# Patient Record
Sex: Female | Born: 1935
Health system: Southern US, Community
[De-identification: ages and names within clinical notes are randomized; demographics above are authoritative.]

## PROBLEM LIST (undated history)

## (undated) DIAGNOSIS — I340 Nonrheumatic mitral (valve) insufficiency: Secondary | ICD-10-CM

## (undated) DIAGNOSIS — I10 Essential (primary) hypertension: Secondary | ICD-10-CM

## (undated) DIAGNOSIS — E785 Hyperlipidemia, unspecified: Secondary | ICD-10-CM

## (undated) DIAGNOSIS — E119 Type 2 diabetes mellitus without complications: Secondary | ICD-10-CM

## (undated) DIAGNOSIS — M1990 Unspecified osteoarthritis, unspecified site: Secondary | ICD-10-CM

## (undated) DIAGNOSIS — IMO0001 Reserved for inherently not codable concepts without codable children: Secondary | ICD-10-CM

## (undated) DIAGNOSIS — J45909 Unspecified asthma, uncomplicated: Secondary | ICD-10-CM

## (undated) DIAGNOSIS — K219 Gastro-esophageal reflux disease without esophagitis: Secondary | ICD-10-CM

## (undated) DIAGNOSIS — N390 Urinary tract infection, site not specified: Secondary | ICD-10-CM

## (undated) DIAGNOSIS — E559 Vitamin D deficiency, unspecified: Secondary | ICD-10-CM

## (undated) HISTORY — DX: Vitamin D deficiency, unspecified: E55.9

## (undated) HISTORY — DX: Essential (primary) hypertension: I10

## (undated) HISTORY — DX: Hyperlipidemia, unspecified: E78.5

## (undated) HISTORY — PX: BACK SURGERY: SHX140

## (undated) HISTORY — DX: Nonrheumatic mitral (valve) insufficiency: I34.0

## (undated) HISTORY — DX: Unspecified asthma, uncomplicated: J45.909

## (undated) HISTORY — PX: OTHER SURGICAL HISTORY: SHX169

## (undated) HISTORY — PX: EXPLORATION MIDDLE EAR: SUR585

## (undated) HISTORY — PX: BLADDER REPAIR: SHX76

## (undated) HISTORY — PX: KNEE ARTHROSCOPY: SUR90

---

## 1978-08-11 HISTORY — PX: CHOLECYSTECTOMY: SHX55

## 2000-05-21 ENCOUNTER — Encounter: Payer: Self-pay | Admitting: Orthopedic Surgery

## 2000-05-21 ENCOUNTER — Encounter: Admission: RE | Admit: 2000-05-21 | Discharge: 2000-05-21 | Payer: Self-pay | Admitting: Orthopedic Surgery

## 2000-07-09 ENCOUNTER — Ambulatory Visit (HOSPITAL_COMMUNITY): Admission: RE | Admit: 2000-07-09 | Discharge: 2000-07-09 | Payer: Self-pay | Admitting: Gastroenterology

## 2000-09-16 ENCOUNTER — Ambulatory Visit (HOSPITAL_COMMUNITY): Admission: RE | Admit: 2000-09-16 | Discharge: 2000-09-16 | Payer: Self-pay | Admitting: Orthopedic Surgery

## 2000-09-22 ENCOUNTER — Encounter: Admission: RE | Admit: 2000-09-22 | Discharge: 2000-12-21 | Payer: Self-pay | Admitting: Orthopedic Surgery

## 2000-10-30 ENCOUNTER — Ambulatory Visit (HOSPITAL_COMMUNITY): Admission: RE | Admit: 2000-10-30 | Discharge: 2000-10-30 | Payer: Self-pay | Admitting: Neurosurgery

## 2000-10-30 ENCOUNTER — Encounter: Payer: Self-pay | Admitting: Neurosurgery

## 2001-03-03 ENCOUNTER — Ambulatory Visit (HOSPITAL_COMMUNITY): Admission: RE | Admit: 2001-03-03 | Discharge: 2001-03-03 | Payer: Self-pay | Admitting: Neurosurgery

## 2002-05-05 ENCOUNTER — Encounter: Payer: Self-pay | Admitting: Urology

## 2002-05-05 ENCOUNTER — Encounter: Admission: RE | Admit: 2002-05-05 | Discharge: 2002-05-05 | Payer: Self-pay | Admitting: Urology

## 2002-08-08 ENCOUNTER — Encounter: Payer: Self-pay | Admitting: Neurosurgery

## 2002-08-08 ENCOUNTER — Ambulatory Visit (HOSPITAL_COMMUNITY): Admission: RE | Admit: 2002-08-08 | Discharge: 2002-08-08 | Payer: Self-pay | Admitting: Neurosurgery

## 2002-08-26 ENCOUNTER — Encounter: Payer: Self-pay | Admitting: Neurosurgery

## 2002-08-31 ENCOUNTER — Encounter: Payer: Self-pay | Admitting: Neurosurgery

## 2002-08-31 ENCOUNTER — Inpatient Hospital Stay (HOSPITAL_COMMUNITY): Admission: RE | Admit: 2002-08-31 | Discharge: 2002-09-02 | Payer: Self-pay | Admitting: Neurosurgery

## 2002-10-19 ENCOUNTER — Encounter: Payer: Self-pay | Admitting: Neurosurgery

## 2002-10-19 ENCOUNTER — Encounter: Admission: RE | Admit: 2002-10-19 | Discharge: 2002-10-19 | Payer: Self-pay | Admitting: Neurosurgery

## 2002-11-28 ENCOUNTER — Encounter: Admission: RE | Admit: 2002-11-28 | Discharge: 2002-11-28 | Payer: Self-pay | Admitting: Neurosurgery

## 2002-11-28 ENCOUNTER — Encounter: Payer: Self-pay | Admitting: Neurosurgery

## 2003-02-28 ENCOUNTER — Encounter: Admission: RE | Admit: 2003-02-28 | Discharge: 2003-02-28 | Payer: Self-pay | Admitting: Neurosurgery

## 2003-02-28 ENCOUNTER — Encounter: Payer: Self-pay | Admitting: Neurosurgery

## 2003-06-06 ENCOUNTER — Inpatient Hospital Stay (HOSPITAL_COMMUNITY): Admission: RE | Admit: 2003-06-06 | Discharge: 2003-06-11 | Payer: Self-pay | Admitting: Urology

## 2003-08-12 HISTORY — PX: COLON SURGERY: SHX602

## 2003-08-29 ENCOUNTER — Encounter: Admission: RE | Admit: 2003-08-29 | Discharge: 2003-08-29 | Payer: Self-pay | Admitting: Neurosurgery

## 2004-10-25 ENCOUNTER — Encounter: Admission: RE | Admit: 2004-10-25 | Discharge: 2004-10-25 | Payer: Self-pay | Admitting: Surgery

## 2005-01-15 ENCOUNTER — Observation Stay (HOSPITAL_COMMUNITY): Admission: RE | Admit: 2005-01-15 | Discharge: 2005-01-17 | Payer: Self-pay | Admitting: Surgery

## 2005-10-27 ENCOUNTER — Encounter: Admission: RE | Admit: 2005-10-27 | Discharge: 2005-10-27 | Payer: Self-pay | Admitting: Geriatric Medicine

## 2006-02-20 ENCOUNTER — Encounter: Admission: RE | Admit: 2006-02-20 | Discharge: 2006-02-20 | Payer: Self-pay | Admitting: Geriatric Medicine

## 2006-10-01 ENCOUNTER — Encounter: Admission: RE | Admit: 2006-10-01 | Discharge: 2006-10-01 | Payer: Self-pay | Admitting: Surgery

## 2006-12-25 ENCOUNTER — Ambulatory Visit (HOSPITAL_COMMUNITY): Admission: RE | Admit: 2006-12-25 | Discharge: 2006-12-26 | Payer: Self-pay | Admitting: Surgery

## 2007-12-13 ENCOUNTER — Encounter: Admission: RE | Admit: 2007-12-13 | Discharge: 2007-12-13 | Payer: Self-pay | Admitting: Geriatric Medicine

## 2008-06-20 ENCOUNTER — Encounter: Admission: RE | Admit: 2008-06-20 | Discharge: 2008-06-20 | Payer: Self-pay | Admitting: Geriatric Medicine

## 2008-08-22 ENCOUNTER — Encounter: Admission: RE | Admit: 2008-08-22 | Discharge: 2008-08-22 | Payer: Self-pay | Admitting: Surgery

## 2008-11-28 ENCOUNTER — Encounter: Admission: RE | Admit: 2008-11-28 | Discharge: 2008-11-28 | Payer: Self-pay | Admitting: Geriatric Medicine

## 2009-09-12 ENCOUNTER — Encounter: Admission: RE | Admit: 2009-09-12 | Discharge: 2009-09-12 | Payer: Self-pay | Admitting: Geriatric Medicine

## 2010-01-16 HISTORY — PX: HERNIA REPAIR: SHX51

## 2010-12-24 NOTE — Op Note (Signed)
NAME:  Rhonda Watkins, Rhonda Watkins                ACCOUNT NO.:  [id number]   MEDICAL RECORD NO.:  [id number]          PATIENT TYPE:  AMB   LOCATION:  DAY                          FACILITY:  [facility]   PHYSICIAN:  Thornton Park. Daphine Deutscher, MD  DATE OF BIRTH:  1931-08-22   DATE OF PROCEDURE:  12/25/2006  DATE OF DISCHARGE:                               OPERATIVE REPORT   PREOPERATIVE DIAGNOSIS:  Recurrent lower Pfannenstiel incision hernia.   POSTOPERATIVE DIAGNOSIS:  Recurrent hernia on left side where mesh had  pulled away from the fascia allowing herniation.   PROCEDURE:  Laparoscopic enterolysis, delineation of hernia with open  reconstruction using previously placed Composix mesh.   SURGEON:  Thornton Park. Daphine Deutscher, MD   ASSISTANT:  Wilmon Arms. Tsuei, M.D.   ANESTHESIA:  General.   DESCRIPTION OF PROCEDURE:  [name] was taken to room 11 on Friday,  12/25/2006 given general anesthesia.  The abdomen was prepped with  Techni-Care and draped sterilely.  Access was gained with the 0 degrees  scope and 5 mm trocar using an OptiVu technique.  Once entered, the  abdomen was insufflated.  She had numerous adhesions and with the  combination of harmonic scalpel and with scissors, I was able to take  down these loops of bowel that were stuck up to the piece of mesh.  From  previous CT scan we knew there was a defect over on the left side.  Careful tedious dissection was done for probably at least an hour to  bring the anatomy into view.  Laterally and inferiorly there was a  defect.  We could feel this anteriorly and we then cut down on the  defect dissecting out the sac and then cutting out the excess sac.  The  Composix Kugel patch was then able to be reattached to the lower  anterior abdominal wall with three horizontal mattress sutures to  plicate and bringing it down under and when that was done it looked  nice.  When I reinserted the scope and took some pictures which I put on  the chart.  The mesh was  then completely filling in the defect.  I  inspected the bowel and it did not appear to be injured at all.  There  is no active bleeding noted.  The abdomen was deflated, the wounds were  closed 4-0 Vicryl and with Dermabond.  The patient tolerated procedure  well.  She will be admitted for overnight observation.      Thornton Park Daphine Deutscher, MD  Electronically Signed     MBM/MEDQ  D:  12/25/2006  T:  12/25/2006  Job:  [id number]   cc:   Hal T. Stoneking, M.D.  Fax: [phone]

## 2010-12-27 NOTE — Op Note (Signed)
NAME:  MACKENZI, KROGH NO.:  [id number]   MEDICAL RECORD NO.:  [id number]                   PATIENT TYPE:  INP   LOCATION:  2855                                 FACILITY:  [facility]   PHYSICIAN:  Donalee Citrin, M.D.                     DATE OF BIRTH:  1931/11/14   DATE OF PROCEDURE:  08/31/2002  DATE OF DISCHARGE:                                 OPERATIVE REPORT   PREOPERATIVE DIAGNOSIS:  Degenerative spondylolisthesis, L4-5 with bilateral  L4 nerve root compression and bilateral L4 radiculopathy.   POSTOPERATIVE DIAGNOSIS:  Degenerative spondylolisthesis, L4-5 with  bilateral L4 nerve root compression and bilateral L4 radiculopathy.   OPERATION PERFORMED:  Redo decompressive laminectomy at L4-5, posterior  lumbar interbody fusion L4-5 using 12 x 24 mm tangent allograft wedges and  locally harvested autograft.  Pedicle screw fixation L4-5 using the [facility]  Horizon pedicle screw system with five 5.5 x 45 pedicle screws at L4 and L5  bilaterally.  Posterolateral arthrodesis, L4-5.  Open reduction, spinal  deformity, L4-5.  Placement of a medium Hemovac drain.   SURGEON:  Donalee Citrin, M.D.   ASSISTANT:  Kathaleen Maser. Pool, M.D.   ANESTHESIA:  General.   INDICATIONS FOR PROCEDURE:  The patient is a very pleasant 77 year old  female who has had longstanding back and left greater than right leg pain  consistent with L4 radiculopathy.  Patient has been refractory to  conservative treatment with anti-inflammatories, steroid injections and  physical therapy.  MRI scan showed severe spondylolisthesis L4-5 with  bilateral L4 and L5 nerve root compression.  The patient had previous  laminectomies at this level several years ago.  The patient was recommended  redo decompressive laminectomy and fusion.  I discussed all the risks and  benefits of surgery with her.  She understands and agreed to proceed  forward.  The patient's symptomatology was consistent with severe  mechanical  low back pain and radiculopathy.   DESCRIPTION OF PROCEDURE:  The patient was brought to the operating room and  placed under general anesthesia.  She was placed prone on the Wilson back  frame.  Back was prepped and draped in the usual sterile fashion.  The old  incision was opened up and Bovie electrocautery was used to dissect down to  the subcutaneous tissue.  Subperiosteal dissection carried out to lamina of  L3, 4, and 5 bilaterally.  The interspinous ligament was noted to be  incompetent between the L4 and L5 spinous process.  The L3 and L4 spinous  process and interspinal ligament was noted to be competent and highly  stable.  The L3-4 facet complex was noted to be very healthy and very stable  and it was decided not to extend the fusion to 3-4 even though the patient  had a questionable history of previous laminotomy.  No previous laminectomy  defect was appreciated at  L3-4 so the fusion was kept just limited to the L4-  5 level.  Then spinous processes were removed and redo decompressive  laminectomy was begun.  The facet complexes were noted to be severely  diastased and hypermobile.  These were all overbitten with Leksell rongeur  and drilled down with a high speed drill.  Then the laminotomy was begun  centrally and extends laterally __________  scar tissue bilaterally.  The  dura was adherent to the facet complex, undersurface of the lamina.  This  was all teased away with a dental dissector and 4 Penfield and using a 3 and  4 mm Kerrison punch the remainder of the laminotomy was completed.  Both the  L4 and L5 nerve roots were exposed and rapidly decompressed out their  foramina and the entire medial aspect of the facet complex was removed after  being teased away from scar tissue bilaterally.  Then interspace was  visualized.  Epidural veins were coagulated.  Attention was first taken to  screw placement.  Using high speed drill, pilot holes were drilled at the  L4  pedicle on the left side, cannulated with all probes and tapped with a 4-5  tap and a 5.5 x 45 pedicle screw inserted at L4.  Fluoroscopy confirmed  depth and trajectory in position along the way as well as direct  intercanalicular inspection confirmed no immediate breech of pedicle.  Then  the L5 pedicle screw on the left was inserted in similar fashion as well as  the L4 and 5 pedicle screws on the right side.  Then attention was taken to  the interspace.  Annulotomy  made on the left side.  Disk was cleaned out  and 10 distractor was inserted.  Fluoroscopy confirmed this distractor to be  slightly small, so it was decided it was going to need a 12 distractor was  then inserted after annulotomy on the right side and this achieved virtually  complete reduction of the L4-5 spondylolisthesis.  Then on the left side  using a size 12 cutter and chisel, a 12 mm Tangent allograft was inserted in  routine sterile fashion.  Then on the right side again this procedure was  repeated.  __________ were cleaned off the end plate.  Epstein curet was  used to scrape off the central end plate.  Locally harvested autograft was  packed against the allograft on the left side and the right side and 12 x 24  mm tangent allograft was inserted.  Both allografts inserted 1 to 2 mm deep  to the posterior vertebral body line and noted to be in good position by  fluoroscopy.  After all grafts were placed, the wound was copiously  irrigated.  Aggressive decortication was carried out in TP's and lateral  gutters at L4-5.  The remainder of the locally harvested allograft was  packed in the lateral gutters along the TP's.  Then a 40 mm rod was  prelordosed and size was selected and inserted, __________  nuts were  tightened down at L5 and the L4 pedicle screws compressed against L5.  All  screw nuts were tightened down, torqued, then postoperative fluoroscopy confirmed good position of the screws, rods and bone  grafts.  Then a medium  Hemovac drain was placed after Gelfoam was laid atop the dura and after all  neuro foramina of L4 and L5 bilaterally were re-explored by hockey stick and  noted to have no further stenosis.  Again the remainder of the tangent  allografts were inspected and noted to be in good position and the drain was  placed. Muscle and fascia reapproximated with 0 interrupted Vicryl.  The  subcutaneous tissue closed with 2-0 interrupted Vicryl and the skin was  closed with running 4-0 subcuticular  Benzoin and Steri-Strips applied.  The  patient went to recovery room in stable condition.  At end of case all  sponge and needle counts correct.                                                Donalee Citrin, M.D.    GC/MEDQ  D:  08/31/2002  T:  08/31/2002  Job:  [id number]

## 2010-12-27 NOTE — Discharge Summary (Signed)
NAME:  Rhonda Watkins, Rhonda Watkins                          ACCOUNT NO.:  [id number]   MEDICAL RECORD NO.:  [id number]                   PATIENT TYPE:  INP   LOCATION:  0372                                 FACILITY:  [facility]   PHYSICIAN:  Jamison Neighbor, M.D.               DATE OF BIRTH:  Apr 29, 1932   DATE OF ADMISSION:  06/06/2003  DATE OF DISCHARGE:  06/11/2003                                 DISCHARGE SUMMARY   DISCHARGE DIAGNOSES:  1. Vaginal vault prolapse.  2. Mixed urinary incontinence.  3. Enterocele.  4. Hypertension.  5. Incisional hernia.   PROCEDURE:  Enterocele repair, vaginal vault suspension, incisional hernia  repair all done on the date of admission.   HISTORY:  This 77 year old female has a history of vaginal vault prolapse.  The patient had undergone previous therapy by another physician with  anterior and posterior repair as well as a pubovaginal sling.  The patient  has persistent vault prolapse and for that reason is to undergo  vaginosacropexy.   PAST MEDICAL HISTORY:  1. Asthma.  2. Hypertension.   MEDICATIONS ON ADMISSION:  1. Accupril.  2. Lasix.  3. Potassium.  4. Calcium.  5. Advair.  6. Vitamin E.  7. Ultracet.  8. Allegra.  9. Biotin.   PAST SURGICAL HISTORY:  Listed on the initial history and physical.   FAMILY HISTORY:  Listed on the initial history and physical.   SOCIAL HISTORY:  Listed on the initial history and physical.   REVIEW OF SYSTEMS:  Listed on the initial history and physical.   HOSPITAL COURSE:  The patient was taken to the operating room where she  underwent repair of the vaginal vault prolapse as well as the enterocele.  The patient had a small incisional hernia closed at time of procedure.  The  patient had a normal postoperative course.  She was rapidly advanced to a  regular diet.  The patient was sent home with a  Foley catheter to give her a full chance to completely heal the bladder.  The patient was ready for discharge by  June 11, 2003.  She was sent home  with pain medication as well as stool softeners and one Septra daily.  The  patient will return to see me in follow-up and will undergo voiding trial.                                               Jamison Neighbor, M.D.    RJE/MEDQ  D:  06/27/2003  T:  06/27/2003  Job:  [id number]

## 2010-12-27 NOTE — Op Note (Signed)
NAMERHILEY, TARVER                ACCOUNT NO.:  [id number]   MEDICAL RECORD NO.:  [id number]          PATIENT TYPE:  AMB   LOCATION:  DAY                          FACILITY:  [facility]   PHYSICIAN:  Thornton Park. Daphine Deutscher, MD  DATE OF BIRTH:  18-May-1932   DATE OF PROCEDURE:  DATE OF DISCHARGE:                                 OPERATIVE REPORT   CCS#:  [id number]   PREOPERATIVE INDICATIONS:  This is a 77 year old lady whose undergone  previous bladder neck repair with a sling through and of Pfannenstiel  incision who presents with multiple hernia in the lower abdomen containing  bowel.   PROCEDURE:  Laparoscopically-assisted repair of multiple ventral hernia and  takedown of incarcerated loops of small intestine within these hernia and  then repair with a Kugel patch anteriorly and then tacking laparoscopically  at completion.   SURGEON:  Thornton Park. Daphine Deutscher, MD.   ASSISTANT:  Jamison Neighbor, M.D.   ANESTHESIA:  General endotracheal.   DRAINS:  None.   ESTIMATED BLOOD LOSS:  Minimal.   DESCRIPTION OF PROCEDURE:  Rhonda Watkins was taken to OR 1 and given general  anesthesia. She has latex allergies so we used latex precautions and she is  allergic to PENICILLIN and she got Cipro. I used Optiview technique and a 5  mm zero scope, entered the abdomen through the left upper quadrant. Once I  was in, she had many more adhesions than I had anticipated. I placed two  other 5 mm ports and with the combination of harmonic scalpel and scissors,  I began careful takedown of multiple loops of small bowel that were densely  adherent to the anterior abdominal, some with hernias, one without. These  were all taken down without any apparent enterotomy revealing three holes in  the lower abdomen. There was a left lower quadrant hernia, probably related  to the incision, although very near an indirect hernia but it was very broad  based approximately 5-6 cm in diameter followed by one more slightly to the  right of the midline and then one above that. After taking the loops of  bowel down after about an hour of dissection time, I then made a transverse  incision through her prior Pfannenstiel incision and then dissected free  these hernia sacs. I entered the abdomen at that point and turned off my gas  and removed the scope and then delineated these three hernia defects. I used  a large Kugel patch that was 13.8 x 17 cm. This was oriented with its long  axis transversely and was anchored at the perimeters of all these defects  with horizontal mattress sutures of #0 Prolene. I also tacked these to the  areas in between to give it full fixation. I went in with the scope and saw  where there was a little piece of bowel that was adherent to this and we  took that down and then completed the repair using the hernia tacker from  within tacking it at the parameter. All the defects were thus taken care of  with this Kugel patch with  the Gortex on the inside and the polypropylene  sutured to the undersurface of the fascia with the #0  Prolene. I closed over this with some rather redundant hernia sac. I  irrigated. There was no bleeding. I closed the subcutaneous tissue with 4-0  Vicryl and with staples as well as on the 5 mm trocar sites. The patient was  taken to recovery room. She will be kept for observation at least a day or  so.       MBM/MEDQ  D:  01/15/2005  T:  01/15/2005  Job:  [id number]   cc:   Hal T. Stoneking, M.D.  [address]  [place], [place] [address]  Fax: [phone]   Jamison Neighbor, M.D.  [address]  [place]  [place] [address]  Fax: [phone]

## 2010-12-27 NOTE — Op Note (Signed)
NAME:  Rhonda Watkins, Rhonda Watkins                          ACCOUNT NO.:  [id number]   MEDICAL RECORD NO.:  [id number]                   PATIENT TYPE:  INP   LOCATION:  Z610                                 FACILITY[facility]  [facility]   PHYSICIAN:  Jamison Neighbor, M.D.               DATE OF BIRTH:  Aug 17, 1931   DATE OF PROCEDURE:  06/06/2003  DATE OF DISCHARGE:                                 OPERATIVE REPORT   PREOPERATIVE DIAGNOSES:  1. Vaginal vault prolapse.  2. Enterocele.  3. Mixed urinary incontinence.   POSTOPERATIVE DIAGNOSES:  1. Vaginal vault prolapse.  2. Enterocele.  3. Mixed urinary incontinence.  4. Rectocele.   PROCEDURES:  1. Vaginosacropexy.  2. Moschowitz enterocele repair.  3. Repair of incisional hernia.   SURGEON:  Jamison Neighbor, M.D.   ASSISTANT:  Susanne Borders, M.D.   ANESTHESIA:  General.   COMPLICATIONS:  None.   DRAINS:  Foley catheter.   BRIEF HISTORY:  This 77 year old female is status post anterior repair,  posterior repair, and sling performed back in 1996.  The patient has  developed some vaginal vault prolapse with an enterocele and also return of  some of her cystocele and rectocele.  She has a mixed-type urinary  incontinence with a fairly significant degree of urgency and frequency but  not all that much in the way of stress incontinence.  The patient is known  to have a posterior cystocele with some vault prolapse as well as a small  rectocele.  The patient understands the risks and benefits of the procedure.  She is aware of the fact that there may not be much improvement of the  rectocele and that additional surgery may be required at a later date if  this remains symptomatic.  She gave full and informed consent.   DESCRIPTION OF PROCEDURE:  After successful induction of general anesthesia,  the patient was placed in the low lithotomy position in a slight  Trendelenburg.  She was prepped and draped in the usual sterile fashion.  A  Foley catheter  was inserted and placed to straight drainage.  The patient  had a previous Pfannenstiel incision.  This was opened.  Out on the right-  hand side it did appear that there was a breakdown of some of the fascia on  that side and the beginnings of an incisional hernia.  The fascia was  dissected off the underlying rectus abdominis superiorly inferiorly.  The  rectus sheath was split in the midline, giving entry into the space of  Retzius.  The Foley catheter was stuck up against the abdominal wall.  A  small opening was made.  This was used to aid in dissection of the bowel  contents away from the bladder.  The peritoneum was opened.  The bowel was  retracted superiorly.  The patient had tremendous redundancy of the sigmoid  colon, and there was a large,  redundant loop that had to be moved from the  right side back over to the left side.  The cecum was also tethered down  into the pelvis.  This was freed up and elevated.  This exposed the  enterocele opening as well as the back of the vaginal vault.  The  retroperitoneum was then opened and the sacral promontory was identified.  Three sutures of 0 Prolene were placed directly into the periosteum.  These  were then attached to a small piece of Marlex mesh.  The patient had  additional sutures of 0 Prolene placed in the vaginal vault.  The  identification of the vaginal vault was performed by placing a Deaver into  the vagina and elevating this.  This was then attached to the previously-  placed Marlex mesh, completing the vaginosacropexy.  The retroperitoneum was  then closed over top of the mesh and a cerclage stitch of 0 Vicryl was used  to complete the closure of the enterocele sac.  Care was taken to ensure  that there was still space for the colon to come back behind the mesh, and  there clearly was space for this.  The redundant loop of bowel was then  carefully placed in an appropriate position.  Inspection showed that the  bowel had  been appropriately elevated.  There did not appear to be any  obstruction to bowel activity, but there was no place where small bowel  could fall behind the mesh or drop back down into the old enterocele sac.  Inspection of the bladder neck showed that this was still well-elevated.  The bladder was really stuck up behind and above, and it was not felt that  the patient required a paravaginal repair of a Burch bladder neck  suspension.  The small opening in the bladder was then closed in two layers  with 2-0 Vicryl, the Foley catheter was left to straight drainage.  The  patient's peritoneum was then closed with a series of Vicryl sutures.  This  also brought the rectus abdominis together in the midline with loose  approximation.  The fascia was then closed with a running suture of #1 PDS.  The weakness on the right lateral side was then closed with a 0 Vicryl  suture.  The incision was then irrigated and the skin was closed with  surgical staples.  The inspection of the vagina at that point showed that  the Foley catheter was in appropriate position, the bladder neck was well-  supported.  The patient did not have much in the way of residual cystocele.  The vault was well-supported.  She did have some weakness posteriorly,  although the large prolapse that had previously been identified is now gone  and it is felt that the patient may not require formal rectocele repair.  If  she remains symptomatic and is having problems with the posterior bulge,  that can be repaired at a later date.  Since she has failed previous  rectocele repair, it is likely she will require a repair that utilizes  either mesh or fascia as a reinforcing structure.  The patient tolerated the  procedure well and was taken to the recovery room in good condition.                                                Molly Maduro  Danne Harbor, M.D.   RJE/MEDQ  D:  06/06/2003  T:  06/06/2003  Job:  [id number]   cc:   Hal T. Stoneking,  M.D.  [address] [place], [place] [address]  Fax: [phone]

## 2010-12-27 NOTE — H&P (Signed)
NAME:  Rhonda Watkins, Rhonda Watkins                          ACCOUNT NO.:  [id number]   MEDICAL RECORD NO.:  [id number]                   PATIENT TYPE:  INP   LOCATION:  0372                                 FACILITY:  [facility]   PHYSICIAN:  Susanne Borders, MD                     DATE OF BIRTH:  1932/07/31   DATE OF ADMISSION:  06/06/2003  DATE OF DISCHARGE:                                HISTORY & PHYSICAL   ADMISSION DIAGNOSIS:  Vaginal vault prolapse.   PROCEDURES:  1. Vaginal sacropexy.  2. Enterocele repair.   HISTORY OF PRESENT ILLNESS:  [name] is a 78 year old female with a  history of vaginal vault prolapse.  She has previously undergone anterior  and posterior and posterior repair with pubovaginal sling.  However, she had  persistence of her vaginal vault prolapse.  Because of this persistence, the  patient was referred to Dr. Logan Bores who offered her a vaginal sacropexy.  She  agreed to this after understanding the risks and benefits and is to be  admitted postoperatively for her convalescence.   MEDICATIONS:  Accupril,  Lasix, potassium chloride, calcium, Advair inhaler,  vitamin E, Ultracet, Allegra as needed, Biotin.   PAST MEDICAL HISTORY:  1. Asthma.  2. High blood pressure.   PAST SURGICAL HISTORY:  1. Ear surgery.  2. Back surgery.  3. Cholecystectomy.  4. Anterior and posterior repair.  5. Colon surgery.   FAMILY HISTORY:  Significant for her mother having kidney cancer.   SOCIAL HISTORY:  The patient drinks alcohol occasionally and denies tobacco  use.   REVIEW OF SYMPTOMS:  Positive only for problems with asthma, occasional  constipation and urinary frequency, urgency and occasional incontinence.  Otherwise her review of systems is negative x all systems.   PHYSICAL EXAMINATION:  GENERAL APPEARANCE:  Alert and oriented x3, no acute  distress.  VITAL SIGNS:  Temperature 98.6, pulse 72, blood pressure 144/76.  HEENT:  Head is normocephalic and atraumatic.   Extraocular movements are  intact.  Oropharynx is clear without erythema or exudate.  NECK:  Supple without masses.  LUNGS:  Clear to auscultation bilaterally.  CARDIOVASCULAR:  Regular rate and rhythm without murmurs, rubs, or gallops.  ABDOMEN:  Soft, nontender and nondistended.  There is a previous well-healed  Pfannenstiel incision.  GENITOURINARY:  Deferred at this time.  EXTREMITIES:  Warm and well perfused.  NEUROLOGIC:  No focal motor or sensory deficits are appreciated.   IMPRESSION:  Vaginal vault prolapse.   PLAN:  The patient is to undergo vaginal vault repair today with a vaginal  sacropexy.  She will be admitted postoperatively for her convalescence.  Susanne Borders, MD    DR/MEDQ  D:  06/07/2003  T:  06/07/2003  Job:  [id number]

## 2011-04-22 ENCOUNTER — Other Ambulatory Visit: Payer: Self-pay | Admitting: Geriatric Medicine

## 2011-04-22 DIAGNOSIS — Q619 Cystic kidney disease, unspecified: Secondary | ICD-10-CM

## 2011-04-25 ENCOUNTER — Ambulatory Visit
Admission: RE | Admit: 2011-04-25 | Discharge: 2011-04-25 | Disposition: A | Discharge status: Home / Self Care | Payer: Medicare Other | Source: Ambulatory Visit | Attending: Geriatric Medicine | Admitting: Geriatric Medicine

## 2011-04-25 DIAGNOSIS — Q619 Cystic kidney disease, unspecified: Secondary | ICD-10-CM

## 2012-04-27 ENCOUNTER — Other Ambulatory Visit: Payer: Self-pay | Admitting: Geriatric Medicine

## 2012-04-27 DIAGNOSIS — N281 Cyst of kidney, acquired: Secondary | ICD-10-CM

## 2012-05-17 ENCOUNTER — Ambulatory Visit
Admission: RE | Admit: 2012-05-17 | Discharge: 2012-05-17 | Disposition: A | Discharge status: Home / Self Care | Payer: Medicare Other | Source: Ambulatory Visit | Attending: Geriatric Medicine | Admitting: Geriatric Medicine

## 2012-05-17 DIAGNOSIS — N281 Cyst of kidney, acquired: Secondary | ICD-10-CM

## 2012-06-13 ENCOUNTER — Other Ambulatory Visit: Payer: Self-pay | Admitting: Allergy and Immunology

## 2013-08-11 HISTORY — PX: BUNIONECTOMY: SHX129

## 2013-09-16 ENCOUNTER — Other Ambulatory Visit: Payer: Self-pay | Admitting: Geriatric Medicine

## 2013-09-16 DIAGNOSIS — R109 Unspecified abdominal pain: Secondary | ICD-10-CM

## 2013-09-17 ENCOUNTER — Encounter: Payer: Self-pay | Admitting: *Deleted

## 2013-09-17 DIAGNOSIS — I340 Nonrheumatic mitral (valve) insufficiency: Secondary | ICD-10-CM | POA: Insufficient documentation

## 2013-09-17 DIAGNOSIS — I10 Essential (primary) hypertension: Secondary | ICD-10-CM | POA: Insufficient documentation

## 2013-09-17 DIAGNOSIS — E7800 Pure hypercholesterolemia, unspecified: Secondary | ICD-10-CM

## 2013-09-17 DIAGNOSIS — M4800 Spinal stenosis, site unspecified: Secondary | ICD-10-CM | POA: Insufficient documentation

## 2013-09-17 DIAGNOSIS — K219 Gastro-esophageal reflux disease without esophagitis: Secondary | ICD-10-CM

## 2013-09-23 ENCOUNTER — Ambulatory Visit
Admission: RE | Admit: 2013-09-23 | Discharge: 2013-09-23 | Disposition: A | Discharge status: Home / Self Care | Payer: Medicare Other | Source: Ambulatory Visit | Attending: Geriatric Medicine | Admitting: Geriatric Medicine

## 2013-09-23 DIAGNOSIS — R109 Unspecified abdominal pain: Secondary | ICD-10-CM

## 2013-09-23 MED ORDER — IOHEXOL 300 MG/ML  SOLN
100.0000 mL | Freq: Once | INTRAMUSCULAR | Status: AC | PRN
  Administered 2013-09-23: 100 mL via INTRAVENOUS

## 2013-12-07 ENCOUNTER — Encounter: Payer: Self-pay | Admitting: Podiatry

## 2013-12-07 ENCOUNTER — Ambulatory Visit (INDEPENDENT_AMBULATORY_CARE_PROVIDER_SITE_OTHER): Payer: Medicare Other

## 2013-12-07 ENCOUNTER — Ambulatory Visit (INDEPENDENT_AMBULATORY_CARE_PROVIDER_SITE_OTHER): Payer: Medicare Other | Admitting: Podiatry

## 2013-12-07 VITALS — BP 156/78 | HR 66 | Resp 16

## 2013-12-07 DIAGNOSIS — M2010 Hallux valgus (acquired), unspecified foot: Secondary | ICD-10-CM

## 2013-12-07 NOTE — Patient Instructions (Signed)

## 2013-12-07 NOTE — Progress Notes (Signed)
Subjective:     Patient ID: Rhonda Watkins, female   DOB: 1931/12/02, 80 y.o.   MRN: [id number]  Foot Pain   patient presents with a large hyperostosis medial aspect first metatarsal head left it's painful when pressed and hard increasingly for her to wear shoe gear. Has tried shoe gear modifications soaks and padding without relief of symptoms for the bunion site   Review of Systems  All other systems reviewed and are negative.      Objective:   Physical Exam  Nursing note and vitals reviewed. Constitutional: She is oriented to person, place, and time.  Cardiovascular: Intact distal pulses.   Musculoskeletal: Normal range of motion.  Neurological: She is oriented to person, place, and time.  Skin: Skin is warm.   neurovascular status intact with muscle strength adequate and hyperostosis medial aspect first metatarsal head left red and painful when pressed. Digits are well perfused and arch height is found to be depressed with mild varicosities in the ankles of both feet     Assessment:     Significant structural bunion deformity left with redness and pain along with depression of the arch noted    Plan:     H&P and x-rays reviewed. Patient wants to get this fixed and I explained that we could do a fairly moderate surgery give her correction and reduced and discomfort she experiences. She wants to procedure understanding risks and at this time I allowed her to sign the consent after reviewing with her limine line going over the risks of surgery and the fact recovery take 6 months to one year. Patient wants surgery signs consent form and is scheduled for surgery in the next several weeks

## 2013-12-07 NOTE — Progress Notes (Signed)
   Subjective:    Patient ID: Rhonda Watkins, female    DOB: 1932-01-23, 80 y.o.   MRN: [id number]  HPI Comments: "I have this bunion"  Patient c/o aching 1st MPJ left foot for several years. The area stays red and sometimes swells. Shoe are becoming more difficult to wear and to purchase the correct size. Has discussed surgery in the past. She has modified her shoe gear to accommodate.  Foot Pain      Review of Systems  All other systems reviewed and are negative.      Objective:   Physical Exam        Assessment & Plan:

## 2013-12-27 DIAGNOSIS — M2010 Hallux valgus (acquired), unspecified foot: Secondary | ICD-10-CM

## 2013-12-29 ENCOUNTER — Telehealth: Payer: Self-pay

## 2013-12-29 NOTE — Telephone Encounter (Signed)
Spoke with pt in regards to post op status. She states that she is doing well, pain is very minimal. Takes tylenol prn. Advised to ice and elevated and wear boot as ordered.

## 2014-01-04 ENCOUNTER — Ambulatory Visit (INDEPENDENT_AMBULATORY_CARE_PROVIDER_SITE_OTHER): Payer: Medicare Other

## 2014-01-04 ENCOUNTER — Ambulatory Visit (INDEPENDENT_AMBULATORY_CARE_PROVIDER_SITE_OTHER): Payer: Medicare Other | Admitting: Podiatry

## 2014-01-04 ENCOUNTER — Encounter: Payer: Self-pay | Admitting: Podiatry

## 2014-01-04 VITALS — BP 95/65 | HR 72 | Resp 16

## 2014-01-04 DIAGNOSIS — M2010 Hallux valgus (acquired), unspecified foot: Secondary | ICD-10-CM

## 2014-01-04 DIAGNOSIS — R609 Edema, unspecified: Secondary | ICD-10-CM

## 2014-01-04 NOTE — Progress Notes (Signed)
Subjective:     Patient ID: Rhonda Watkins, female   DOB: 21-Oct-1931, 80 y.o.   MRN: [id number]  HPI patient presents stating that she is doing good with her foot but she does admit that she walk on her foot without her boot several times. One week after having bunion correction left   Review of Systems     Objective:   Physical Exam Neurovascular status intact with no health history changes noted and wound edges well coapted with hallux in rectus position and structural correction of looks good    Assessment:     Good alignment clinically but I am concerned about x-rays due to the fact she walked on this without boot    Plan:     X-rays reviewed with patient and explained that has been some cracking the first metatarsal head that should be localized do to her walking out her air fracture walker. We will keep her in her air fracture walker completely for the next 3 weeks and reevaluate and make sure no further damage to the joint occurs and I did explain chances for arthritis associated with movement reapplied sterile dressing today

## 2014-01-19 ENCOUNTER — Ambulatory Visit (INDEPENDENT_AMBULATORY_CARE_PROVIDER_SITE_OTHER): Payer: Medicare Other

## 2014-01-19 ENCOUNTER — Ambulatory Visit (INDEPENDENT_AMBULATORY_CARE_PROVIDER_SITE_OTHER): Payer: Medicare Other | Admitting: Podiatry

## 2014-01-19 DIAGNOSIS — M2010 Hallux valgus (acquired), unspecified foot: Secondary | ICD-10-CM

## 2014-01-19 DIAGNOSIS — R609 Edema, unspecified: Secondary | ICD-10-CM

## 2014-01-19 NOTE — Progress Notes (Signed)
Subjective:     Patient ID: Rhonda Watkins, female   DOB: 11/24/1931, 80 y.o.   MRN: [id number]  HPI patient states that she's doing better but still having some swelling around the joint but states that she's been much better about not walking on it without immobilization   Review of Systems     Objective:   Physical Exam Neurovascular status intact with still edema in the forefoot left with good clinical structural alignment of the first digit and good motion with 30 dorsiflexion 20 plantar flexion    Assessment:     Patient had traumatized her first metatarsal head but I believe it is stable at its current place    Plan:     X-ray the foot and advised that has been stable for the last several weeks. Dispensed surgical shoe and anklet with instructions on continued complete immobilization and reappoint her recheck in 2 weeks

## 2014-01-20 ENCOUNTER — Telehealth: Payer: Self-pay | Admitting: *Deleted

## 2014-01-20 NOTE — Telephone Encounter (Signed)
Is it all right to get incision wet now?  I'd like to know if I can take a shower instead of washing that foot.  I'll await your call.  I left her a message that it's okay to get the foot wet, okay to shower.

## 2014-01-25 ENCOUNTER — Ambulatory Visit: Payer: Medicare Other | Admitting: Podiatry

## 2014-01-27 ENCOUNTER — Encounter: Payer: Self-pay | Admitting: Podiatry

## 2014-01-27 NOTE — Progress Notes (Signed)
Dr Charlsie Merlesegal performed a left [place] bunionectomy on 12/27/2013. Demerol 50mg  #30 1-2 tabs Q4-6 hours prn pain. Phenergan 25mg  #30 1-2 tabs Q 4-6 hours prn nausea

## 2014-01-31 ENCOUNTER — Other Ambulatory Visit: Payer: Self-pay | Admitting: Orthopedic Surgery

## 2014-02-01 ENCOUNTER — Encounter: Payer: Self-pay | Admitting: Podiatry

## 2014-02-01 ENCOUNTER — Ambulatory Visit (INDEPENDENT_AMBULATORY_CARE_PROVIDER_SITE_OTHER): Payer: Medicare Other | Admitting: Podiatry

## 2014-02-01 ENCOUNTER — Ambulatory Visit (INDEPENDENT_AMBULATORY_CARE_PROVIDER_SITE_OTHER): Payer: Medicare Other

## 2014-02-01 VITALS — BP 151/72 | HR 71 | Resp 16

## 2014-02-01 DIAGNOSIS — M2010 Hallux valgus (acquired), unspecified foot: Secondary | ICD-10-CM

## 2014-02-01 DIAGNOSIS — M2012 Hallux valgus (acquired), left foot: Secondary | ICD-10-CM

## 2014-02-02 NOTE — Progress Notes (Signed)
Subjective:     Patient ID: Rhonda Watkins, female   DOB: [birth year], 80 y.o.   MRN: [id number]  HPI patient presents stating I'm doing well with my left foot that my knee has been sore and I do get swelling in my foot if I can on it during the day for extensive periods   Review of Systems     Objective:   Physical Exam Neurovascular status intact with mild forefoot edema left with well-healed surgical site first MPJ with excellent range of motion of 35 dorsiflexion 25 plantar flexion with no pain or crepitus in the joint    Assessment:     Doing well after having structural bunion correction left with some trauma to the capital fragment which appears to be healing well clinically    Plan:     X-rays reviewed and advised on compression therapy with Ace bandage which was dispensed. Patient also elevate and will continue with range of motion exercises and saw shoe gear. Reappoint to recheck again in approximately 6 weeks earlier if any issues should occur

## 2014-02-07 ENCOUNTER — Other Ambulatory Visit: Payer: Self-pay | Admitting: Orthopedic Surgery

## 2014-02-16 ENCOUNTER — Telehealth: Payer: Self-pay

## 2014-02-16 DIAGNOSIS — M5126 Other intervertebral disc displacement, lumbar region: Secondary | ICD-10-CM | POA: Insufficient documentation

## 2014-02-16 NOTE — Telephone Encounter (Signed)
Spoke with pt regarding concerns about swollen ankle, she stated that her first time transitioning from surgical shoe into a regular shoe was yesterday, and she wore the shoe all day. Advised pt to slowly transition and wear regular shoe for a couple hours at a time along with surgical shoes in between times. Advised to wear compression stocking when up and mobile

## 2014-02-21 ENCOUNTER — Ambulatory Visit (HOSPITAL_BASED_OUTPATIENT_CLINIC_OR_DEPARTMENT_OTHER): Admission: RE | Admit: 2014-02-21 | Payer: Medicare Other | Source: Ambulatory Visit | Admitting: Orthopedic Surgery

## 2014-02-21 ENCOUNTER — Encounter (HOSPITAL_BASED_OUTPATIENT_CLINIC_OR_DEPARTMENT_OTHER): Admission: RE | Payer: Self-pay | Source: Ambulatory Visit

## 2014-02-21 SURGERY — RELEASE, FIRST DORSAL COMPARTMENT, HAND
Anesthesia: Monitor Anesthesia Care | Laterality: Left

## 2014-03-08 ENCOUNTER — Ambulatory Visit (INDEPENDENT_AMBULATORY_CARE_PROVIDER_SITE_OTHER): Payer: Medicare Other

## 2014-03-08 ENCOUNTER — Ambulatory Visit (INDEPENDENT_AMBULATORY_CARE_PROVIDER_SITE_OTHER): Payer: Medicare Other | Admitting: Podiatry

## 2014-03-08 ENCOUNTER — Encounter: Payer: Self-pay | Admitting: Podiatry

## 2014-03-08 VITALS — BP 140/77 | HR 65 | Resp 16

## 2014-03-08 DIAGNOSIS — M2012 Hallux valgus (acquired), left foot: Secondary | ICD-10-CM

## 2014-03-08 DIAGNOSIS — M2010 Hallux valgus (acquired), unspecified foot: Secondary | ICD-10-CM

## 2014-03-08 DIAGNOSIS — M779 Enthesopathy, unspecified: Secondary | ICD-10-CM

## 2014-03-08 MED ORDER — TRIAMCINOLONE ACETONIDE 10 MG/ML IJ SUSP
10.0000 mg | Freq: Once | INTRAMUSCULAR | Status: AC
Start: 2014-03-08 — End: 2014-03-08
  Administered 2014-03-08: 10 mg

## 2014-03-14 NOTE — Progress Notes (Signed)
Subjective:     Patient ID: Rhonda Watkins, female   DOB: Apr 03, 1932, 80 y.o.   MRN: [id number]  HPI patient states that my foot is improving but he continues to swell by the end of the day   Review of Systems     Objective:   Physical Exam Neurovascular status intact with no health history changes noted and moderate edema around the first metatarsal head noted with no loss of motion or crepitus    Assessment:     Swelling that is occurring which is relatively normal for this. Postop    Plan:     Advised patient on continued elevation compression reviewed x-rays and gradual increase in activities swelling will probably persist for another 4-6 months

## 2014-04-19 ENCOUNTER — Ambulatory Visit (INDEPENDENT_AMBULATORY_CARE_PROVIDER_SITE_OTHER): Payer: Medicare Other | Admitting: Podiatry

## 2014-04-19 ENCOUNTER — Ambulatory Visit (INDEPENDENT_AMBULATORY_CARE_PROVIDER_SITE_OTHER): Payer: Medicare Other

## 2014-04-19 ENCOUNTER — Encounter: Payer: Self-pay | Admitting: Podiatry

## 2014-04-19 VITALS — BP 144/74 | HR 71 | Resp 16

## 2014-04-19 DIAGNOSIS — M7750 Other enthesopathy of unspecified foot: Secondary | ICD-10-CM

## 2014-04-19 DIAGNOSIS — M2012 Hallux valgus (acquired), left foot: Secondary | ICD-10-CM

## 2014-04-19 DIAGNOSIS — M2010 Hallux valgus (acquired), unspecified foot: Secondary | ICD-10-CM

## 2014-04-19 MED ORDER — TRIAMCINOLONE ACETONIDE 10 MG/ML IJ SUSP: 10.0000 mg | Freq: Once | INTRAMUSCULAR | Status: DC

## 2014-04-20 NOTE — Progress Notes (Signed)
Subjective:     Patient ID: Rhonda Watkins, female   DOB: 1932-07-31, 80 y.o.   MRN: [id number]  HPI patient presents stating structural bunions have done well on my left foot and it started to feel better but I have pain and swelling outside of the foot probably from walking differently   Review of Systems     Objective:   Physical Exam Neurovascular status intact with muscle strength adequate range of motion within normal limits and discomfort in the lateral aspect left foot at the insertion of the tendon into the peroneal base with inflammation and well-healed surgical site left first metatarsal and good range of motion    Assessment:     Doing well post bunionectomy left and tendinitis left peroneal insert    Plan:     Reviewed conditions and at this time did a careful injection left lateral foot 3 mg Kenalog 5 mg Xylocaine and advised on physical therapy. Reappoint her recheck

## 2014-05-19 ENCOUNTER — Ambulatory Visit (INDEPENDENT_AMBULATORY_CARE_PROVIDER_SITE_OTHER): Payer: Medicare Other

## 2014-05-19 ENCOUNTER — Ambulatory Visit (INDEPENDENT_AMBULATORY_CARE_PROVIDER_SITE_OTHER): Payer: Medicare Other | Admitting: Podiatry

## 2014-05-19 ENCOUNTER — Encounter: Payer: Self-pay | Admitting: Podiatry

## 2014-05-19 VITALS — BP 96/74 | HR 77 | Resp 13 | Ht 62.0 in | Wt 162.0 lb

## 2014-05-19 DIAGNOSIS — M7742 Metatarsalgia, left foot: Secondary | ICD-10-CM

## 2014-05-19 DIAGNOSIS — M722 Plantar fascial fibromatosis: Secondary | ICD-10-CM

## 2014-05-19 DIAGNOSIS — M79672 Pain in left foot: Secondary | ICD-10-CM

## 2014-05-19 DIAGNOSIS — M2012 Hallux valgus (acquired), left foot: Secondary | ICD-10-CM

## 2014-05-19 DIAGNOSIS — M21612 Bunion of left foot: Secondary | ICD-10-CM

## 2014-05-19 MED ORDER — TRIAMCINOLONE ACETONIDE 10 MG/ML IJ SUSP
10.0000 mg | Freq: Once | INTRAMUSCULAR | Status: AC
  Administered 2014-05-19: 10 mg

## 2014-05-19 NOTE — Patient Instructions (Signed)
Plantar Fasciitis (Heel Spur Syndrome) with Rehab The plantar fascia is a fibrous, ligament-like, soft-tissue structure that spans the bottom of the foot. Plantar fasciitis is a condition that causes pain in the foot due to inflammation of the tissue. SYMPTOMS   Pain and tenderness on the underneath side of the foot.  Pain that worsens with standing or walking. CAUSES  Plantar fasciitis is caused by irritation and injury to the plantar fascia on the underneath side of the foot. Common mechanisms of injury include:  Direct trauma to bottom of the foot.  Damage to a small nerve that runs under the foot where the main fascia attaches to the heel bone.  Stress placed on the plantar fascia due to bone spurs. RISK INCREASES WITH:   Activities that place stress on the plantar fascia (running, jumping, pivoting, or cutting).  Poor strength and flexibility.  Improperly fitted shoes.  Tight calf muscles.  Flat feet.  Failure to warm-up properly before activity.  Obesity. PREVENTION  Warm up and stretch properly before activity.  Allow for adequate recovery between workouts.  Maintain physical fitness:  Strength, flexibility, and endurance.  Cardiovascular fitness.  Maintain a health body weight.  Avoid stress on the plantar fascia.  Wear properly fitted shoes, including arch supports for individuals who have flat feet. PROGNOSIS  If treated properly, then the symptoms of plantar fasciitis usually resolve without surgery. However, occasionally surgery is necessary. RELATED COMPLICATIONS   Recurrent symptoms that may result in a chronic condition.  Problems of the lower back that are caused by compensating for the injury, such as limping.  Pain or weakness of the foot during push-off following surgery.  Chronic inflammation, scarring, and partial or complete fascia tear, occurring more often from repeated injections. TREATMENT  Treatment initially involves the use of  ice and medication to help reduce pain and inflammation. The use of strengthening and stretching exercises may help reduce pain with activity, especially stretches of the Achilles tendon. These exercises may be performed at home or with a therapist. Your caregiver may recommend that you use heel cups of arch supports to help reduce stress on the plantar fascia. Occasionally, corticosteroid injections are given to reduce inflammation. If symptoms persist for greater than 6 months despite non-surgical (conservative), then surgery may be recommended.  MEDICATION   If pain medication is necessary, then nonsteroidal anti-inflammatory medications, such as aspirin and ibuprofen, or other minor pain relievers, such as acetaminophen, are often recommended.  Do not take pain medication within 7 days before surgery.  Prescription pain relievers may be given if deemed necessary by your caregiver. Use only as directed and only as much as you need.  Corticosteroid injections may be given by your caregiver. These injections should be reserved for the most serious cases, because they may only be given a certain number of times. HEAT AND COLD  Cold treatment (icing) relieves pain and reduces inflammation. Cold treatment should be applied for 10 to 15 minutes every 2 to 3 hours for inflammation and pain and immediately after any activity that aggravates your symptoms. Use ice packs or massage the area with a piece of ice (ice massage).  Heat treatment may be used prior to performing the stretching and strengthening activities prescribed by your caregiver, physical therapist, or athletic trainer. Use a heat pack or soak the injury in warm water. SEEK IMMEDIATE MEDICAL CARE IF:  Treatment seems to offer no benefit, or the condition worsens.  Any medications produce adverse side effects. EXERCISES RANGE   OF MOTION (ROM) AND STRETCHING EXERCISES - Plantar Fasciitis (Heel Spur Syndrome) These exercises may help you  when beginning to rehabilitate your injury. Your symptoms may resolve with or without further involvement from your physician, physical therapist or athletic trainer. While completing these exercises, remember:   Restoring tissue flexibility helps normal motion to return to the joints. This allows healthier, less painful movement and activity.  An effective stretch should be held for at least 30 seconds.  A stretch should never be painful. You should only feel a gentle lengthening or release in the stretched tissue. RANGE OF MOTION - Toe Extension, Flexion  Sit with your right / left leg crossed over your opposite knee.  Grasp your toes and gently pull them back toward the top of your foot. You should feel a stretch on the bottom of your toes and/or foot.  Hold this stretch for __________ seconds.  Now, gently pull your toes toward the bottom of your foot. You should feel a stretch on the top of your toes and or foot.  Hold this stretch for __________ seconds. Repeat __________ times. Complete this stretch __________ times per day.  RANGE OF MOTION - Ankle Dorsiflexion, Active Assisted  Remove shoes and sit on a chair that is preferably not on a carpeted surface.  Place right / left foot under knee. Extend your opposite leg for support.  Keeping your heel down, slide your right / left foot back toward the chair until you feel a stretch at your ankle or calf. If you do not feel a stretch, slide your bottom forward to the edge of the chair, while still keeping your heel down.  Hold this stretch for __________ seconds. Repeat __________ times. Complete this stretch __________ times per day.  STRETCH - Gastroc, Standing  Place hands on wall.  Extend right / left leg, keeping the front knee somewhat bent.  Slightly point your toes inward on your back foot.  Keeping your right / left heel on the floor and your knee straight, shift your weight toward the wall, not allowing your back to  arch.  You should feel a gentle stretch in the right / left calf. Hold this position for __________ seconds. Repeat __________ times. Complete this stretch __________ times per day. STRETCH - Soleus, Standing  Place hands on wall.  Extend right / left leg, keeping the other knee somewhat bent.  Slightly point your toes inward on your back foot.  Keep your right / left heel on the floor, bend your back knee, and slightly shift your weight over the back leg so that you feel a gentle stretch deep in your back calf.  Hold this position for __________ seconds. Repeat __________ times. Complete this stretch __________ times per day. STRETCH - Gastrocsoleus, Standing  Note: This exercise can place a lot of stress on your foot and ankle. Please complete this exercise only if specifically instructed by your caregiver.   Place the ball of your right / left foot on a step, keeping your other foot firmly on the same step.  Hold on to the wall or a rail for balance.  Slowly lift your other foot, allowing your body weight to press your heel down over the edge of the step.  You should feel a stretch in your right / left calf.  Hold this position for __________ seconds.  Repeat this exercise with a slight bend in your right / left knee. Repeat __________ times. Complete this stretch __________ times per day.    STRENGTHENING EXERCISES - Plantar Fasciitis (Heel Spur Syndrome)  These exercises may help you when beginning to rehabilitate your injury. They may resolve your symptoms with or without further involvement from your physician, physical therapist or athletic trainer. While completing these exercises, remember:   Muscles can gain both the endurance and the strength needed for everyday activities through controlled exercises.  Complete these exercises as instructed by your physician, physical therapist or athletic trainer. Progress the resistance and repetitions only as guided. STRENGTH -  Towel Curls  Sit in a chair positioned on a non-carpeted surface.  Place your foot on a towel, keeping your heel on the floor.  Pull the towel toward your heel by only curling your toes. Keep your heel on the floor.  If instructed by your physician, physical therapist or athletic trainer, add ____________________ at the end of the towel. Repeat __________ times. Complete this exercise __________ times per day. STRENGTH - Ankle Inversion  Secure one end of a rubber exercise band/tubing to a fixed object (table, pole). Loop the other end around your foot just before your toes.  Place your fists between your knees. This will focus your strengthening at your ankle.  Slowly, pull your big toe up and in, making sure the band/tubing is positioned to resist the entire motion.  Hold this position for __________ seconds.  Have your muscles resist the band/tubing as it slowly pulls your foot back to the starting position. Repeat __________ times. Complete this exercises __________ times per day.  Document Released: 07/28/2005 Document Revised: 10/20/2011 Document Reviewed: 11/09/2008 [employer] Patient Information 2015 [employer]. This information is not intended to replace advice given to you by your health care provider. Make sure you discuss any questions you have with your health care provider.  

## 2014-05-19 NOTE — Progress Notes (Signed)
   Subjective:    Patient ID: Angeline Slimlara J Strand, female    DOB: 1931/12/15, 80 y.o.   MRN: [id number]  HPI Comments: Ms. Yvetta Coderrcue, 80 year old female, presents the office today with complaints of left heel and forefoot pain. She states this is ongoing for approximately 3 days. She denies any history of injury or trauma to the area. States that the pain is worse with weightbearing and after periods of activity. She states that her pain has decreased. She does state that she has started to wear her cold weather shoes although there the same shoes that she wore last year. No other complaints at this time. No acute changes since last appointment.  Foot Pain      Review of Systems  All other systems reviewed and are negative.      Objective:   Physical Exam AAO x3, NAD DP/PT pulses palpable bilaterally, CRT less than 3 seconds  Protective sensation intact with Simms Weinstein monofilament, vibratory sensation intact, Achilles tendon reflex intact. Tenderness to palpation the plantar medial tubercle of the calcaneus near the insertion of the plantar fascia. No pain with lateral compression of the calcaneus or along the posterior aspect. No overlying edema, erythema, increased warmth. No pain with vibratory sensation. No pain along the course of the plantar fascia within the arch of the foot. Diffuse tenderness over the plantar metatarsal heads 1 through 5. No pinpoint bony tenderness along the metatarsals or the digits. No pain with range of motion of the MPJ. MMT 5/5 ROM WNL No calf pain, swelling, warmth. No open lesions or pre-ulcerative lesions.      Assessment & Plan:  80 year old female with likely left foot heel pain likely result of plantar fasciitis, metatarsalgia. -X-rays were obtained and reviewed with the patient. -Treatment options discussed including alternatives, risks, complications. -Patient elects to proceed with steroid injection into the lef heel. Under sterile skin  preparation, a total of 2cc of kenalog 10 and 2% lidocaine plain were infiltrated into the symptomatic area without complication. A band-aid was applied. Patient tolerated the injection well without complication.  -Ice to the effected area. - Discussed stretching exercises. -Metatarsal pads dispensed. -Discussed the importance of supportive shoe gear. -Followup in 2 weeks or sooner if any problems are to arise or any change in symptoms. In the meantime call the office with any questions, concerns, change in symptoms.

## 2014-06-02 ENCOUNTER — Encounter: Payer: Self-pay | Admitting: Podiatry

## 2014-06-02 ENCOUNTER — Ambulatory Visit (INDEPENDENT_AMBULATORY_CARE_PROVIDER_SITE_OTHER): Payer: Medicare Other | Admitting: Podiatry

## 2014-06-02 VITALS — BP 147/82 | HR 65 | Resp 16

## 2014-06-02 DIAGNOSIS — M722 Plantar fascial fibromatosis: Secondary | ICD-10-CM

## 2014-06-02 DIAGNOSIS — M7742 Metatarsalgia, left foot: Secondary | ICD-10-CM

## 2014-06-04 NOTE — Progress Notes (Signed)
Patient ID: Rhonda Watkins, female   DOB: 10/27/1931, 80 y.o.   MRN: [id number]  Subjective: Patient returns to the office today for follow-up evaluation of left foot plantar fasciitis metatarsalgia. She states that since last appointment she has significant improvement in symptoms and she no longer has any heel pain. She has been continuing to wear the metatarsal pad which alleviates her symptoms and the forefoot. No acute changes since last appointment. No other complaints at this time.  Objective: AAO x3, NAD DP/PT pulses palpable bilaterally, CRT less than 3 seconds Protective sensation intact with Simms Weinstein monofilament, vibratory sensation intact, Achilles tendon reflex intact No tenderness palpation of the plantar medial tubercle of the calcaneus at the insertion of the plantar fascia or with lateral compression of the calcaneus or along the posterior aspect. No pain on the course the plantar fashion within the arch of foot. No pain along the metatarsal heads 1 through 5 bilaterally. No pain with MTPJ range of motion. No pain with vibratory sensation. MMT 5/5, ROM WNL No calf pain, swelling, warmth. No open lesions or pre-ulcerative lesions.  Assessment: 80 year old female with resolved left foot plantar fasciitis, metatarsalgia.  Plan: -Treatment options discussed including alternatives, risks, complications. -Discussed with the patient treatment to help prevent recurrence of pain. Continue stretching exercises as well as icing. -Dispensed more metatarsal pads as needed. -Follow-up as needed. Call with any questions, concerns, change in symptoms.

## 2014-10-06 ENCOUNTER — Encounter: Payer: Self-pay | Admitting: Podiatry

## 2014-10-06 ENCOUNTER — Ambulatory Visit (INDEPENDENT_AMBULATORY_CARE_PROVIDER_SITE_OTHER): Payer: Medicare Other | Admitting: Podiatry

## 2014-10-06 VITALS — BP 100/58 | HR 78 | Resp 12 | Ht 62.0 in | Wt 164.0 lb

## 2014-10-06 DIAGNOSIS — L603 Nail dystrophy: Secondary | ICD-10-CM

## 2014-10-06 NOTE — Progress Notes (Signed)
   Subjective:    Patient ID: Rhonda Watkins, female    DOB: 1932/05/20, 81 y.o.   MRN: [id number]  HPI 81 year old female presents the office today with complaints of a piece of toenail underneath the skin on the left big toe. She states that she trimmed her nails the other day and the nail split and there is a piece of nail within the medial nail border. She denies pain associated with the area denies any redness or drainage. She went to have the nail [place] to help prevent the ingrown toenail. No other complaints at this time. No acute changes since last appointment.   Review of Systems  All other systems reviewed and are negative.      Objective:   Physical Exam AAO 3, NAD DP/PT pulses palpable, CRT less than 3 seconds Protective sensation intact with Simms Weinstein monofilament, vibratory sensation intact, Achilles tendon reflex intact. There is a small spicule of nail distally with the left medial hallux toenail. There is no tenderness to palpation of this area and there is no erythema or drainage. Remaining nails without pathology. No areas of tenderness to bilateral lower extremities. No overlying edema, erythema, increase in warmth. No open lesions or pre-ulcerative lesions. No pain with calf compression, swelling, warmth, erythema.        Assessment & Plan:  81 year old female left hallux nail spicule in medial border -Treatment options discussed with the patient including alternatives, risks, complications. -Nail sharply debrided to remove the spicule of nail with be on nail border without complications/bleeding. -Follow-up as needed. In the meantime, encouraged to call the office with any questions, concerns, change in symptoms.

## 2014-12-29 ENCOUNTER — Other Ambulatory Visit: Payer: Self-pay | Admitting: Neurosurgery

## 2015-01-06 NOTE — Pre-Procedure Instructions (Addendum)
Rhonda Watkins  01/06/2015     Your procedure is scheduled on June 3.  [facility] Ad[facility] at 1:30 P.M.  Call this number if you have problems the morning of surgery:  [phone]   Remember:  Do not eat food or drink liquids after midnight.  Take these medicines the morning of surgery with A SIP OF WATER Eye drops, Diltiazem, Asmanex, Omeprazole, Oxycodone (if needed)   STOP Multiple Vitamins, B12, Vitamin D, Biotin, Aspirin, Vitamin C today   STOP/ Do not take Aspirin, Aleve, Naproxen, Advil, Ibuprofen, Motrin, Vitamins, Herbs, or Supplements starting today   Do not wear jewelry, make-up or nail polish.  Do not wear lotions, powders, or perfumes.  You may wear deodorant.  Do not shave 48 hours prior to surgery.  Men may shave face and neck.  Do not bring valuables to the hospital.  [facility] is not responsible for any belongings or valuables.  Contacts, dentures or bridgework may not be worn into surgery.  Leave your suitcase in the car.  After surgery it may be brought to your room.  For patients admitted to the hospital, discharge time will be determined by your treatment team.  Patients discharged the day of surgery will not be allowed to drive home.   [facility] - Preparing for Surgery  Before surgery, you can play an important role.  Because skin is not sterile, your skin needs to be as free of germs as possible.  You can reduce the number of germs on you skin by washing with CHG (chlorahexidine gluconate) soap before surgery.  CHG is an antiseptic cleaner which kills germs and bonds with the skin to continue killing germs even after washing.  Please DO NOT use if you have an allergy to CHG or antibacterial soaps.  If your skin becomes reddened/irritated stop using the CHG and inform your nurse when you arrive at Short Stay.  Do not shave (including legs and underarms) for at least 48 hours prior to the first CHG shower.  You may shave your  face.  Please follow these instructions carefully:   1.  Shower with CHG Soap the night before surgery and the morning of Surgery.  2.  If you choose to wash your hair, wash your hair first as usual with your normal shampoo.  3.  After you shampoo, rinse your hair and body thoroughly to remove the shampoo.  4.  Use CHG as you would any other liquid soap.  You can apply CHG directly to the skin and wash gently with scrungie or a clean washcloth.  5.  Apply the CHG Soap to your body ONLY FROM THE NECK DOWN.  Do not use on open wounds or open sores.  Avoid contact with your eyes, ears, mouth and genitals (private parts).  Wash genitals (private parts) with your normal soap.  6.  Wash thoroughly, paying special attention to the area where your surgery will be performed.  7.  Thoroughly rinse your body with warm water from the neck down.  8.  DO NOT shower/wash with your normal soap after using and rinsing off the CHG Soap.  9.  Pat yourself dry with a clean towel.            10.  Wear clean pajamas.            11.  Place clean sheets on your bed the night of your first shower and do not sleep with  pets.  Day of Surgery  Do not apply any lotions the morning of surgery.  Please wear clean clothes to the hospital/surgery center.  Please read over the following fact sheets that you were given. Pain Booklet, Coughing and Deep Breathing, Blood Transfusion Information and Surgical Site Infection Prevention

## 2015-01-09 ENCOUNTER — Encounter (HOSPITAL_COMMUNITY): Payer: Self-pay

## 2015-01-09 ENCOUNTER — Encounter (HOSPITAL_COMMUNITY)
Admission: RE | Admit: 2015-01-09 | Discharge: 2015-01-09 | Disposition: A | Discharge status: Home / Self Care | Payer: Medicare Other | Source: Ambulatory Visit | Attending: Neurosurgery | Admitting: Neurosurgery

## 2015-01-09 HISTORY — DX: Urinary tract infection, site not specified: N39.0

## 2015-01-09 HISTORY — DX: Unspecified osteoarthritis, unspecified site: M19.90

## 2015-01-09 HISTORY — DX: Reserved for inherently not codable concepts without codable children: IMO0001

## 2015-01-09 HISTORY — DX: Gastro-esophageal reflux disease without esophagitis: K21.9

## 2015-01-09 LAB — CBC
HEMATOCRIT: 47 % — AB (ref 36.0–46.0)
Hemoglobin: 15.5 g/dL — ABNORMAL HIGH (ref 12.0–15.0)
MCH: 30 pg (ref 26.0–34.0)
MCHC: 33 g/dL (ref 30.0–36.0)
MCV: 91.1 fL (ref 78.0–100.0)
Platelets: 249 10*3/uL (ref 150–400)
RBC: 5.16 MIL/uL — ABNORMAL HIGH (ref 3.87–5.11)
RDW: 13.6 % (ref 11.5–15.5)
WBC: 7.8 10*3/uL (ref 4.0–10.5)

## 2015-01-09 LAB — BASIC METABOLIC PANEL
Anion gap: 12 (ref 5–15)
BUN: 13 mg/dL (ref 6–20)
CALCIUM: 10 mg/dL (ref 8.9–10.3)
CO2: 24 mmol/L (ref 22–32)
CREATININE: 0.79 mg/dL (ref 0.44–1.00)
Chloride: 102 mmol/L (ref 101–111)
GFR calc Af Amer: >60 mL/min (ref >60)
GLUCOSE: 116 mg/dL — AB (ref 65–99)
Potassium: 3.8 mmol/L (ref 3.5–5.1)
Sodium: 138 mmol/L (ref 135–145)

## 2015-01-09 LAB — ABO/RH: ABO/RH(D): B POS

## 2015-01-09 LAB — SURGICAL PCR SCREEN
MRSA, PCR: NEGATIVE
Staphylococcus aureus: NEGATIVE

## 2015-01-09 LAB — TYPE AND SCREEN
ABO/RH(D): B POS
Antibody Screen: NEGATIVE

## 2015-01-11 MED ORDER — DEXAMETHASONE SODIUM PHOSPHATE 10 MG/ML IJ SOLN
10.0000 mg | INTRAMUSCULAR | Status: AC
  Administered 2015-01-12: 10 mg via INTRAVENOUS
  Filled 2015-01-11: qty 1

## 2015-01-11 MED ORDER — VANCOMYCIN HCL IN DEXTROSE 1-5 GM/200ML-% IV SOLN
1000.0000 mg | INTRAVENOUS | Status: AC
  Administered 2015-01-12: 1000 mg via INTRAVENOUS
  Filled 2015-01-11: qty 200

## 2015-01-12 ENCOUNTER — Inpatient Hospital Stay (HOSPITAL_COMMUNITY)
Admission: RE | Admit: 2015-01-12 | Discharge: 2015-01-15 | DRG: 460 | Disposition: A | Discharge status: Home / Self Care | Payer: Medicare Other | Source: Ambulatory Visit | Attending: Neurosurgery | Admitting: Neurosurgery

## 2015-01-12 ENCOUNTER — Encounter (HOSPITAL_COMMUNITY)
Admission: RE | Disposition: A | Discharge status: Home / Self Care | Payer: Self-pay | Source: Ambulatory Visit | Attending: Neurosurgery

## 2015-01-12 ENCOUNTER — Encounter (HOSPITAL_COMMUNITY): Payer: Self-pay | Admitting: Anesthesiology

## 2015-01-12 ENCOUNTER — Inpatient Hospital Stay (HOSPITAL_COMMUNITY): Payer: Medicare Other | Admitting: Certified Registered Nurse Anesthetist

## 2015-01-12 ENCOUNTER — Inpatient Hospital Stay (HOSPITAL_COMMUNITY): Payer: Medicare Other

## 2015-01-12 DIAGNOSIS — K219 Gastro-esophageal reflux disease without esophagitis: Secondary | ICD-10-CM | POA: Diagnosis present

## 2015-01-12 DIAGNOSIS — Z8249 Family history of ischemic heart disease and other diseases of the circulatory system: Secondary | ICD-10-CM | POA: Diagnosis not present

## 2015-01-12 DIAGNOSIS — Z7982 Long term (current) use of aspirin: Secondary | ICD-10-CM | POA: Diagnosis not present

## 2015-01-12 DIAGNOSIS — Z79899 Other long term (current) drug therapy: Secondary | ICD-10-CM | POA: Diagnosis not present

## 2015-01-12 DIAGNOSIS — E559 Vitamin D deficiency, unspecified: Secondary | ICD-10-CM | POA: Diagnosis present

## 2015-01-12 DIAGNOSIS — E785 Hyperlipidemia, unspecified: Secondary | ICD-10-CM | POA: Diagnosis present

## 2015-01-12 DIAGNOSIS — Z951 Presence of aortocoronary bypass graft: Secondary | ICD-10-CM

## 2015-01-12 DIAGNOSIS — M4806 Spinal stenosis, lumbar region: Secondary | ICD-10-CM | POA: Diagnosis present

## 2015-01-12 DIAGNOSIS — Z87891 Personal history of nicotine dependence: Secondary | ICD-10-CM

## 2015-01-12 DIAGNOSIS — M545 Low back pain: Secondary | ICD-10-CM | POA: Diagnosis present

## 2015-01-12 DIAGNOSIS — M5116 Intervertebral disc disorders with radiculopathy, lumbar region: Principal | ICD-10-CM | POA: Diagnosis present

## 2015-01-12 DIAGNOSIS — I10 Essential (primary) hypertension: Secondary | ICD-10-CM | POA: Diagnosis present

## 2015-01-12 DIAGNOSIS — J45909 Unspecified asthma, uncomplicated: Secondary | ICD-10-CM | POA: Diagnosis present

## 2015-01-12 DIAGNOSIS — Z419 Encounter for procedure for purposes other than remedying health state, unspecified: Secondary | ICD-10-CM

## 2015-01-12 DIAGNOSIS — M5126 Other intervertebral disc displacement, lumbar region: Secondary | ICD-10-CM | POA: Diagnosis present

## 2015-01-12 SURGERY — POSTERIOR LUMBAR FUSION 1 WITH HARDWARE REMOVAL
Anesthesia: General | Site: Back

## 2015-01-12 MED ORDER — ADULT MULTIVITAMIN W/MINERALS CH
1.0000 | ORAL_TABLET | Freq: Every day | ORAL | Status: DC
  Administered 2015-01-13: 1 via ORAL
  Filled 2015-01-12 (×3): qty 1

## 2015-01-12 MED ORDER — ALUM & MAG HYDROXIDE-SIMETH 200-200-20 MG/5ML PO SUSP: 30.0000 mL | Freq: Four times a day (QID) | ORAL | Status: DC | PRN

## 2015-01-12 MED ORDER — VANCOMYCIN HCL 10 G IV SOLR
1250.0000 mg | INTRAVENOUS | Status: DC
  Administered 2015-01-13: 1250 mg via INTRAVENOUS
  Filled 2015-01-12 (×2): qty 1250

## 2015-01-12 MED ORDER — PROPOFOL 10 MG/ML IV BOLUS
INTRAVENOUS | Status: DC | PRN
  Administered 2015-01-12: 150 mg via INTRAVENOUS

## 2015-01-12 MED ORDER — SODIUM CHLORIDE 0.9 % IV SOLN: 250.0000 mL | INTRAVENOUS | Status: DC

## 2015-01-12 MED ORDER — LACTATED RINGERS IV SOLN
INTRAVENOUS | Status: DC
  Administered 2015-01-12 (×4): via INTRAVENOUS

## 2015-01-12 MED ORDER — ROCURONIUM BROMIDE 50 MG/5ML IV SOLN
INTRAVENOUS | Status: AC
  Filled 2015-01-12: qty 1

## 2015-01-12 MED ORDER — OXYCODONE-ACETAMINOPHEN 5-325 MG PO TABS: 1.0000 | ORAL_TABLET | ORAL | Status: DC | PRN

## 2015-01-12 MED ORDER — PROMETHAZINE HCL 25 MG/ML IJ SOLN: 6.2500 mg | INTRAMUSCULAR | Status: DC | PRN

## 2015-01-12 MED ORDER — ONDANSETRON HCL 4 MG/2ML IJ SOLN
INTRAMUSCULAR | Status: DC | PRN
  Administered 2015-01-12: 4 mg via INTRAVENOUS

## 2015-01-12 MED ORDER — HYDROMORPHONE HCL 1 MG/ML IJ SOLN: 0.5000 mg | INTRAMUSCULAR | Status: DC | PRN

## 2015-01-12 MED ORDER — METHOCARBAMOL 1000 MG/10ML IJ SOLN
500.0000 mg | Freq: Four times a day (QID) | INTRAVENOUS | Status: DC | PRN
  Filled 2015-01-12: qty 5

## 2015-01-12 MED ORDER — PHENOL 1.4 % MT LIQD: 1.0000 | OROMUCOSAL | Status: DC | PRN

## 2015-01-12 MED ORDER — MULTI COMPLETE PO CAPS: ORAL_CAPSULE | Freq: Every day | ORAL | Status: DC

## 2015-01-12 MED ORDER — THROMBIN 20000 UNITS EX SOLR
CUTANEOUS | Status: DC | PRN
  Administered 2015-01-12: 20 mL via TOPICAL

## 2015-01-12 MED ORDER — HYDROMORPHONE HCL 1 MG/ML IJ SOLN
INTRAMUSCULAR | Status: AC
  Administered 2015-01-12: 0.5 mg via INTRAVENOUS
  Filled 2015-01-12: qty 1

## 2015-01-12 MED ORDER — PANTOPRAZOLE SODIUM 40 MG PO TBEC
40.0000 mg | DELAYED_RELEASE_TABLET | Freq: Every day | ORAL | Status: DC
  Administered 2015-01-13 – 2015-01-15 (×3): 40 mg via ORAL
  Filled 2015-01-12 (×3): qty 1

## 2015-01-12 MED ORDER — FENTANYL CITRATE (PF) 100 MCG/2ML IJ SOLN
INTRAMUSCULAR | Status: DC | PRN
  Administered 2015-01-12 (×4): 50 ug via INTRAVENOUS
  Administered 2015-01-12: 100 ug via INTRAVENOUS
  Administered 2015-01-12: 50 ug via INTRAVENOUS

## 2015-01-12 MED ORDER — FENTANYL CITRATE (PF) 250 MCG/5ML IJ SOLN
INTRAMUSCULAR | Status: AC
  Filled 2015-01-12: qty 5

## 2015-01-12 MED ORDER — BACITRACIN 50000 UNITS IM SOLR
INTRAMUSCULAR | Status: DC | PRN
  Administered 2015-01-12: 500 mL

## 2015-01-12 MED ORDER — LIDOCAINE HCL (CARDIAC) 20 MG/ML IV SOLN
INTRAVENOUS | Status: AC
  Filled 2015-01-12: qty 5

## 2015-01-12 MED ORDER — VITAMIN C 500 MG PO TABS
1000.0000 mg | ORAL_TABLET | Freq: Every day | ORAL | Status: DC
  Administered 2015-01-12 – 2015-01-15 (×3): 1000 mg via ORAL
  Filled 2015-01-12 (×4): qty 2

## 2015-01-12 MED ORDER — POLYETHYLENE GLYCOL 3350 17 G PO PACK: 17.0000 g | PACK | Freq: Every day | ORAL | Status: DC | PRN

## 2015-01-12 MED ORDER — VITAMIN B-12 100 MCG PO TABS
100.0000 ug | ORAL_TABLET | Freq: Every day | ORAL | Status: DC
  Administered 2015-01-13 – 2015-01-15 (×2): 100 ug via ORAL
  Filled 2015-01-12 (×3): qty 1

## 2015-01-12 MED ORDER — BIOTIN 2.5 MG PO CAPS: 2.5000 mg | ORAL_CAPSULE | Freq: Every day | ORAL | Status: DC

## 2015-01-12 MED ORDER — FENTANYL CITRATE (PF) 100 MCG/2ML IJ SOLN
INTRAMUSCULAR | Status: AC
  Filled 2015-01-12: qty 2

## 2015-01-12 MED ORDER — VANCOMYCIN HCL 1000 MG IV SOLR
INTRAVENOUS | Status: AC
Start: 2015-01-12 — End: 2015-01-13
  Filled 2015-01-12: qty 1000

## 2015-01-12 MED ORDER — GLYCOPYRROLATE 0.2 MG/ML IJ SOLN
INTRAMUSCULAR | Status: DC | PRN
  Administered 2015-01-12: 0.6 mg via INTRAVENOUS

## 2015-01-12 MED ORDER — BUPIVACAINE HCL (PF) 0.25 % IJ SOLN
INTRAMUSCULAR | Status: DC | PRN
  Administered 2015-01-12: 5 mL

## 2015-01-12 MED ORDER — LIDOCAINE HCL (CARDIAC) 20 MG/ML IV SOLN
INTRAVENOUS | Status: DC | PRN
  Administered 2015-01-12: 60 mg via INTRAVENOUS

## 2015-01-12 MED ORDER — METHOCARBAMOL 500 MG PO TABS
500.0000 mg | ORAL_TABLET | Freq: Four times a day (QID) | ORAL | Status: DC | PRN
  Filled 2015-01-12: qty 1

## 2015-01-12 MED ORDER — ROCURONIUM BROMIDE 50 MG/5ML IV SOLN
INTRAVENOUS | Status: AC
  Filled 2015-01-12: qty 2

## 2015-01-12 MED ORDER — PHENYLEPHRINE HCL 10 MG/ML IJ SOLN
10.0000 mg | INTRAVENOUS | Status: DC | PRN
  Administered 2015-01-12: 15 ug/min via INTRAVENOUS

## 2015-01-12 MED ORDER — POTASSIUM CHLORIDE CRYS ER 10 MEQ PO TBCR
10.0000 meq | EXTENDED_RELEASE_TABLET | Freq: Once | ORAL | Status: AC
  Administered 2015-01-12: 10 meq via ORAL
  Filled 2015-01-12: qty 1

## 2015-01-12 MED ORDER — 0.9 % SODIUM CHLORIDE (POUR BTL) OPTIME
TOPICAL | Status: DC | PRN
  Administered 2015-01-12: 1000 mL

## 2015-01-12 MED ORDER — PHENYLEPHRINE HCL 10 MG/ML IJ SOLN
INTRAMUSCULAR | Status: DC | PRN
  Administered 2015-01-12 (×3): 80 ug via INTRAVENOUS

## 2015-01-12 MED ORDER — GLYCOPYRROLATE 0.2 MG/ML IJ SOLN
INTRAMUSCULAR | Status: AC
  Filled 2015-01-12: qty 8

## 2015-01-12 MED ORDER — DOCUSATE SODIUM 100 MG PO CAPS
100.0000 mg | ORAL_CAPSULE | Freq: Two times a day (BID) | ORAL | Status: DC
  Administered 2015-01-12 – 2015-01-14 (×4): 100 mg via ORAL
  Filled 2015-01-12 (×4): qty 1

## 2015-01-12 MED ORDER — OXYCODONE HCL 5 MG PO TABS
2.5000 mg | ORAL_TABLET | ORAL | Status: DC | PRN
  Administered 2015-01-12 – 2015-01-15 (×9): 2.5 mg via ORAL
  Filled 2015-01-12 (×9): qty 1

## 2015-01-12 MED ORDER — KETOTIFEN FUMARATE 0.025 % OP SOLN
2.0000 [drp] | Freq: Two times a day (BID) | OPHTHALMIC | Status: DC
  Administered 2015-01-15: 2 [drp] via OPHTHALMIC
  Filled 2015-01-12: qty 5

## 2015-01-12 MED ORDER — SODIUM CHLORIDE 0.9 % IJ SOLN: 3.0000 mL | INTRAMUSCULAR | Status: DC | PRN

## 2015-01-12 MED ORDER — OXYCODONE-ACETAMINOPHEN 5-325 MG PO TABS
1.0000 | ORAL_TABLET | ORAL | Status: DC | PRN
  Administered 2015-01-13 – 2015-01-15 (×9): 1 via ORAL
  Filled 2015-01-12 (×9): qty 1

## 2015-01-12 MED ORDER — SENNOSIDES-DOCUSATE SODIUM 8.6-50 MG PO TABS: 1.0000 | ORAL_TABLET | Freq: Every evening | ORAL | Status: DC | PRN

## 2015-01-12 MED ORDER — OXYCODONE HCL 5 MG PO TABS: 5.0000 mg | ORAL_TABLET | Freq: Once | ORAL | Status: DC | PRN

## 2015-01-12 MED ORDER — VITAMIN B 12 100 MCG PO LOZG: 100.0000 ug | LOZENGE | Freq: Every day | ORAL | Status: DC

## 2015-01-12 MED ORDER — ONDANSETRON HCL 4 MG/2ML IJ SOLN
INTRAMUSCULAR | Status: AC
  Filled 2015-01-12: qty 2

## 2015-01-12 MED ORDER — DILTIAZEM HCL ER 180 MG PO CP24
180.0000 mg | ORAL_CAPSULE | Freq: Every day | ORAL | Status: DC
  Administered 2015-01-13 – 2015-01-15 (×3): 180 mg via ORAL
  Filled 2015-01-12 (×6): qty 1

## 2015-01-12 MED ORDER — MENTHOL 3 MG MT LOZG: 1.0000 | LOZENGE | OROMUCOSAL | Status: DC | PRN

## 2015-01-12 MED ORDER — ROCURONIUM BROMIDE 100 MG/10ML IV SOLN
INTRAVENOUS | Status: DC | PRN
  Administered 2015-01-12 (×2): 10 mg via INTRAVENOUS
  Administered 2015-01-12: 50 mg via INTRAVENOUS

## 2015-01-12 MED ORDER — ACETAMINOPHEN 325 MG PO TABS: 650.0000 mg | ORAL_TABLET | ORAL | Status: DC | PRN

## 2015-01-12 MED ORDER — OXYCODONE-ACETAMINOPHEN 7.5-325 MG PO TABS: 1.0000 | ORAL_TABLET | ORAL | Status: DC | PRN

## 2015-01-12 MED ORDER — PROPOFOL 10 MG/ML IV BOLUS
INTRAVENOUS | Status: AC
  Filled 2015-01-12: qty 20

## 2015-01-12 MED ORDER — SODIUM CHLORIDE 0.9 % IJ SOLN
3.0000 mL | Freq: Two times a day (BID) | INTRAMUSCULAR | Status: DC
  Administered 2015-01-15: 3 mL via INTRAVENOUS

## 2015-01-12 MED ORDER — ASPIRIN 325 MG PO TABS
325.0000 mg | ORAL_TABLET | Freq: Once | ORAL | Status: AC
  Administered 2015-01-12: 325 mg via ORAL
  Filled 2015-01-12: qty 1

## 2015-01-12 MED ORDER — OXYCODONE HCL 5 MG/5ML PO SOLN
5.0000 mg | Freq: Once | ORAL | Status: DC | PRN
Start: 2015-01-12 — End: 2015-01-12

## 2015-01-12 MED ORDER — BUDESONIDE 0.25 MG/2ML IN SUSP
0.2500 mg | Freq: Two times a day (BID) | RESPIRATORY_TRACT | Status: DC
  Administered 2015-01-13 – 2015-01-14 (×3): 0.25 mg via RESPIRATORY_TRACT
  Filled 2015-01-12 (×4): qty 2

## 2015-01-12 MED ORDER — IRBESARTAN 75 MG PO TABS
37.5000 mg | ORAL_TABLET | Freq: Every day | ORAL | Status: DC
  Administered 2015-01-13 – 2015-01-15 (×3): 37.5 mg via ORAL
  Filled 2015-01-12 (×4): qty 0.5

## 2015-01-12 MED ORDER — FUROSEMIDE 20 MG PO TABS
20.0000 mg | ORAL_TABLET | Freq: Every day | ORAL | Status: DC
  Administered 2015-01-12 – 2015-01-15 (×4): 20 mg via ORAL
  Filled 2015-01-12 (×5): qty 1

## 2015-01-12 MED ORDER — NEOSTIGMINE METHYLSULFATE 10 MG/10ML IV SOLN
INTRAVENOUS | Status: DC | PRN
  Administered 2015-01-12: 4 mg via INTRAVENOUS

## 2015-01-12 MED ORDER — ONDANSETRON HCL 4 MG/2ML IJ SOLN: 4.0000 mg | INTRAMUSCULAR | Status: DC | PRN

## 2015-01-12 MED ORDER — VANCOMYCIN HCL 1000 MG IV SOLR
INTRAVENOUS | Status: DC | PRN
  Administered 2015-01-12: 1000 mg

## 2015-01-12 MED ORDER — ACETAMINOPHEN 650 MG RE SUPP: 650.0000 mg | RECTAL | Status: DC | PRN

## 2015-01-12 MED ORDER — HYDROMORPHONE HCL 1 MG/ML IJ SOLN
0.2500 mg | INTRAMUSCULAR | Status: DC | PRN
  Administered 2015-01-12 (×4): 0.5 mg via INTRAVENOUS

## 2015-01-12 MED ORDER — ASPIRIN EC 81 MG PO TBEC
81.0000 mg | DELAYED_RELEASE_TABLET | Freq: Every day | ORAL | Status: DC
  Administered 2015-01-13 – 2015-01-15 (×3): 81 mg via ORAL
  Filled 2015-01-12 (×6): qty 1

## 2015-01-12 MED ORDER — FAMOTIDINE 10 MG PO TABS
10.0000 mg | ORAL_TABLET | Freq: Every day | ORAL | Status: DC
  Administered 2015-01-12 – 2015-01-15 (×4): 10 mg via ORAL
  Filled 2015-01-12 (×4): qty 1

## 2015-01-12 MED ORDER — VITAMIN D 1000 UNITS PO TABS
3000.0000 [IU] | ORAL_TABLET | Freq: Every day | ORAL | Status: DC
  Administered 2015-01-12 – 2015-01-15 (×4): 3000 [IU] via ORAL
  Filled 2015-01-12 (×5): qty 3

## 2015-01-12 SURGICAL SUPPLY — 77 items
ALLOSTEM STRIP 20MMX50MM (Tissue) ×1 IMPLANT
APL SKNCLS STERI-STRIP NONHPOA (GAUZE/BANDAGES/DRESSINGS) ×1
BAG DECANTER FOR FLEXI CONT (MISCELLANEOUS) ×2 IMPLANT
BENZOIN TINCTURE PRP APPL 2/3 (GAUZE/BANDAGES/DRESSINGS) ×2 IMPLANT
BLADE CLIPPER SURG (BLADE) IMPLANT
BLADE SURG 11 STRL SS (BLADE) ×2 IMPLANT
BONE ALLOSTEM MORSELIZED 5CC (Bone Implant) ×1 IMPLANT
BRUSH SCRUB EZ PLAIN DRY (MISCELLANEOUS) ×2 IMPLANT
BUR MATCHSTICK NEURO 3.0 LAGG (BURR) ×2 IMPLANT
BUR PRECISION FLUTE 6.0 (BURR) ×2 IMPLANT
CAGE RISE 11-17-15 10X22 (Cage) ×2 IMPLANT
CANISTER SUCT 3000ML PPV (MISCELLANEOUS) ×2 IMPLANT
CAP LOCKING THREADED (Cap) ×2 IMPLANT
CONT SPEC 4OZ CLIKSEAL STRL BL (MISCELLANEOUS) ×4 IMPLANT
COVER BACK TABLE 24X17X13 BIG (DRAPES) IMPLANT
COVER BACK TABLE 60X90IN (DRAPES) ×2 IMPLANT
DECANTER SPIKE VIAL GLASS SM (MISCELLANEOUS) ×2 IMPLANT
DRAPE C-ARM 42X72 X-RAY (DRAPES) ×2 IMPLANT
DRAPE C-ARMOR (DRAPES) ×2 IMPLANT
DRAPE LAPAROTOMY 100X72X124 (DRAPES) ×2 IMPLANT
DRAPE POUCH INSTRU U-SHP 10X18 (DRAPES) ×2 IMPLANT
DRAPE PROXIMA HALF (DRAPES) IMPLANT
DRAPE SURG 17X23 STRL (DRAPES) ×2 IMPLANT
DRSG OPSITE 4X5.5 SM (GAUZE/BANDAGES/DRESSINGS) ×4 IMPLANT
DRSG OPSITE POSTOP 3X4 (GAUZE/BANDAGES/DRESSINGS) ×1 IMPLANT
DRSG OPSITE POSTOP 4X8 (GAUZE/BANDAGES/DRESSINGS) ×1 IMPLANT
DURAPREP 26ML APPLICATOR (WOUND CARE) ×2 IMPLANT
ELECT REM PT RETURN 9FT ADLT (ELECTROSURGICAL) ×2
ELECTRODE REM PT RTRN 9FT ADLT (ELECTROSURGICAL) ×1 IMPLANT
EVACUATOR 3/16  PVC DRAIN (DRAIN) ×1
EVACUATOR 3/16 PVC DRAIN (DRAIN) ×1 IMPLANT
GAUZE SPONGE 4X4 12PLY STRL (GAUZE/BANDAGES/DRESSINGS) ×2 IMPLANT
GAUZE SPONGE 4X4 16PLY XRAY LF (GAUZE/BANDAGES/DRESSINGS) IMPLANT
GLOVE BIO SURGEON STRL SZ 6.5 (GLOVE) ×5 IMPLANT
GLOVE BIO SURGEON STRL SZ8 (GLOVE) ×4 IMPLANT
GLOVE BIOGEL PI IND STRL 8 (GLOVE) IMPLANT
GLOVE BIOGEL PI INDICATOR 8 (GLOVE) ×1
GLOVE ECLIPSE 7.0 STRL STRAW (GLOVE) ×1 IMPLANT
GLOVE ECLIPSE 7.5 STRL STRAW (GLOVE) ×1 IMPLANT
GLOVE EXAM NITRILE LRG STRL (GLOVE) IMPLANT
GLOVE EXAM NITRILE MD LF STRL (GLOVE) IMPLANT
GLOVE EXAM NITRILE XL STR (GLOVE) IMPLANT
GLOVE EXAM NITRILE XS STR PU (GLOVE) IMPLANT
GLOVE INDICATOR 6.5 STRL GRN (GLOVE) ×2 IMPLANT
GLOVE INDICATOR 7.5 STRL GRN (GLOVE) ×1 IMPLANT
GLOVE INDICATOR 8.5 STRL (GLOVE) ×4 IMPLANT
GOWN STRL REUS W/ TWL LRG LVL3 (GOWN DISPOSABLE) IMPLANT
GOWN STRL REUS W/ TWL XL LVL3 (GOWN DISPOSABLE) ×2 IMPLANT
GOWN STRL REUS W/TWL 2XL LVL3 (GOWN DISPOSABLE) ×1 IMPLANT
GOWN STRL REUS W/TWL LRG LVL3 (GOWN DISPOSABLE) ×6
GOWN STRL REUS W/TWL XL LVL3 (GOWN DISPOSABLE) ×4
KIT BASIN OR (CUSTOM PROCEDURE TRAY) ×2 IMPLANT
KIT INFUSE XX SMALL 0.7CC (Orthopedic Implant) ×1 IMPLANT
KIT ROOM TURNOVER OR (KITS) ×2 IMPLANT
LIQUID BAND (GAUZE/BANDAGES/DRESSINGS) ×2 IMPLANT
MILL MEDIUM DISP (BLADE) ×1 IMPLANT
NEEDLE HYPO 25X1 1.5 SAFETY (NEEDLE) ×2 IMPLANT
NS IRRIG 1000ML POUR BTL (IV SOLUTION) ×2 IMPLANT
PACK LAMINECTOMY NEURO (CUSTOM PROCEDURE TRAY) ×2 IMPLANT
PAD ARMBOARD 7.5X6 YLW CONV (MISCELLANEOUS) ×6 IMPLANT
ROD CREO 45MM SPINAL (Rod) ×2 IMPLANT
SCREW AMP MODULAR CREO 6.5X45 (Screw) ×4 IMPLANT
SCREW PA THRD CREO TULIP 5.5X4 (Head) ×2 IMPLANT
SCREW SET (Screw) ×2 IMPLANT
SPONGE LAP 4X18 X RAY DECT (DISPOSABLE) IMPLANT
SPONGE SURGIFOAM ABS GEL 100 (HEMOSTASIS) ×2 IMPLANT
STRIP CLOSURE SKIN 1/2X4 (GAUZE/BANDAGES/DRESSINGS) ×4 IMPLANT
SUT VIC AB 0 CT1 18XCR BRD8 (SUTURE) ×2 IMPLANT
SUT VIC AB 0 CT1 8-18 (SUTURE) ×4
SUT VIC AB 2-0 CT1 18 (SUTURE) ×2 IMPLANT
SUT VICRYL 4-0 PS2 18IN ABS (SUTURE) ×2 IMPLANT
SYR 20ML ECCENTRIC (SYRINGE) ×2 IMPLANT
TAPE STRIPS DRAPE STRL (GAUZE/BANDAGES/DRESSINGS) ×1 IMPLANT
TOWEL OR 17X24 6PK STRL BLUE (TOWEL DISPOSABLE) ×2 IMPLANT
TOWEL OR 17X26 10 PK STRL BLUE (TOWEL DISPOSABLE) ×2 IMPLANT
TRAY FOLEY W/METER SILVER 14FR (SET/KITS/TRAYS/PACK) ×2 IMPLANT
WATER STERILE IRR 1000ML POUR (IV SOLUTION) ×2 IMPLANT

## 2015-01-12 NOTE — Transfer of Care (Signed)
Immediate Anesthesia Transfer of Care Note  Patient: Rhonda Watkins  Procedure(s) Performed: Procedure(s): Lumbar three-four posterior lumbar interbody fusion, exploration of lumbar fusion, and removal of hardware. (N/A)  Patient Location: PACU  Anesthesia Type:General  Level of Consciousness: awake, alert  and oriented  Airway & Oxygen Therapy: Patient connected to nasal cannula oxygen  Post-op Assessment: Report given to RN, Post -op Vital signs reviewed and stable and Patient moving all extremities X 4  Post vital signs: Reviewed and stable  Last Vitals:  Filed Vitals:   01/12/15 1350  BP: 152/57  Pulse: 72  Temp: 36.4 C  Resp: 18    Complications: No apparent anesthesia complications

## 2015-01-12 NOTE — Anesthesia Postprocedure Evaluation (Signed)
  Anesthesia Post-op Note  Patient: Rhonda Watkins  Procedure(s) Performed: Procedure(s) (LRB): Lumbar three-four posterior lumbar interbody fusion, exploration of lumbar fusion, and removal of hardware. (N/A)  Patient Location: PACU  Anesthesia Type: General  Level of Consciousness: awake and alert   Airway and Oxygen Therapy: Patient Spontanous Breathing  Post-op Pain: mild  Post-op Assessment: Post-op Vital signs reviewed, Patient's Cardiovascular Status Stable, Respiratory Function Stable, Patent Airway and No signs of Nausea or vomiting  Last Vitals:  Filed Vitals:   01/12/15 2130  BP: 153/51  Pulse: 67  Temp: 36.4 C  Resp: 14    Post-op Vital Signs: stable   Complications: No apparent anesthesia complications

## 2015-01-12 NOTE — Op Note (Signed)
Preoperative diagnosis: Herniated nucleus pulposus L3-4 with right L4 radiculopathy, degenerative disc disease L3-4, severe lumbar spinal stenosis L3-4  Postoperative diagnosis: Same  Procedure: Decompressive lumbar laminectomy and posterior lumbar interbody fusion L3-4 utilizing the globus rise expandable cage system packed with locally harvested autograft mixed withallostem morsels strips and BMP  Pedicle screw fixation L3-4 utilizing the globus Creo modular screw set at L3 tying into the hamate 5.5 [employer] screws left in at L4.  Expiration of fusion removal of hardware L4-5  Posterior lateral arthrodesis L3-4 using locally harvested autograft mixed withallostem morsels strips and BMP  Surgeon: Rhonda HiddenGary Aloysius Watkins  Asst.:Rhonda Watkins  Anesthesia: Gen.  EBL: Minimal  History of present illness: Patient is a very pleasant 81 year old female presented on L4-5 fusion 10 years ago developed severe back pain and right leg pain remained down L4 disposition of the last several days weeks. Workup revealed a herniated disc severe degenerative collapse lumbar stenosis L3-4. It also. She has solid fusion at L4-5. Due to patient's failed conservative treatment imaging findings and progression of clinical syndrome I recommended an exploration of fusion removal of hardware L4-5 and decompressive laminectomy fusion removal of disc L3-4. I extensively went over the risks and benefits of the operation with the patient as well as perioperative course expectations of outcome alternatives of surgery and she understood and agreed to proceed forward.  Operative procedure: Patient brought into the or was induced under general anesthesia positioned prone the Wilson frame her back was prepped and draped in routine sterile fashion. Her old incision was opened up and extended slightly cephalad scar tissues dissected free and subperiosteal dissections care lamina of L3 and L4 the hardware was then exposed through L4-5 I then  removed the knots remove the rods removed the screws and fusion at L4-5 appear to be solid so I left the L4 screws in place to remove the L5 screws. Then moved the spinous process at L3 to the central decompression complete near facetectomies were performed with radical foraminotomies of the L3 and L4 nerve root freeing up all scar tissue from the undersurface of the L4 nerve root. I collected all the epidural veins clean out the disc space bilaterally using sequential distraction with an 11 distractor in place I cleaned out. The endplates then selected a 11-17 expandable cage 22 mm in length I packed with the autograft mix and then placed. The contralateral side packed a small BMP sponge and autograft centrally placed the other cage placed both pedicle screws using a high-speed drill and fluoroscopy at each step along the way. Placed 6 5 x 45 screws at L3 then copiously irrigated irrigated the wound aggressively decorticated the TPs from L3 along the lateral gutters at L4 replace the BMP and autograft mix posterior laterally assembled the heads and the Creo modular screws. Then attached the 45 mm rod top tightening nuts were placed the foraminal reinspected confirm patency removed a very large free fragment of disc from underneath the 4 root on the right done the discectomy. Then placed Gelfoam over the dura laced a large Hemovac drain closing wound in layers with interrupted Vicryls and a running 4 subcuticular in the skin Dermabond benzo and Steri-Strips and sterile dressing were applied patient recovered in stable condition. At the end of case on it counts sponge counts were correct.

## 2015-01-12 NOTE — Progress Notes (Signed)
ANTIBIOTIC CONSULT NOTE - INITIAL  Pharmacy Consult for Vancomycin Indication: Surgical prophylaxis while drain in place  Allergies  Allergen Reactions  . Amlodipine   . Avelox [Moxifloxacin Hcl In Nacl]   . Crestor [Rosuvastatin]   . Dynacirc [Isradipine]   . Enablex [Darifenacin Hydrobromide Er]   . Latex   . Lescol [Fluvastatin Sodium]   . Lipitor [Atorvastatin]   . Lovastatin   . Mobic [Meloxicam]   . Norvasc [Amlodipine Besylate]   . Novocain [Procaine]   . Penicillins   . Pravachol [Pravastatin Sodium]   . Questran [Cholestyramine]   . Toprol Xl [Metoprolol Tartrate]   . Ultracet [Tramadol-Acetaminophen]   . Welchol [Colesevelam Hcl]   . Zetia [Ezetimibe]   . Zocor [Simvastatin]     Patient Measurements: Weight: 155 lb 9.6 oz (70.58 kg)  Vital Signs: Temp: 97.7 F (36.5 C) (06/03 2211) Temp Source: Oral (06/03 2211) BP: 158/65 mmHg (06/03 2211) Pulse Rate: 87 (06/03 2211) Intake/Output from previous day:   Intake/Output from this shift: Total I/O In: 1000 [I.V.:1000] Out: 425 [Urine:200; Drains:25; Blood:200]  Labs: No results for input(s): WBC, HGB, PLT, LABCREA, CREATININE in the last 72 hours. Estimated Creatinine Clearance: 51.6 mL/min (by C-G formula based on Cr of 0.79). No results for input(s): VANCOTROUGH, VANCOPEAK, VANCORANDOM, GENTTROUGH, GENTPEAK, GENTRANDOM, TOBRATROUGH, TOBRAPEAK, TOBRARND, AMIKACINPEAK, AMIKACINTROU, AMIKACIN in the last 72 hours.   Microbiology: Recent Results (from the past 720 hour(s))  Surgical pcr screen     Status: None   Collection Time: 01/09/15  2:06 PM  Result Value Ref Range Status   MRSA, PCR NEGATIVE NEGATIVE Final   Staphylococcus aureus NEGATIVE NEGATIVE Final    Comment:        The Xpert SA Assay (FDA approved for NASAL specimens in patients over 721 years of age), is one component of a comprehensive surveillance program.  Test performance has been validated by [facility] for patients greater than  or equal to 79 year old. It is not intended to diagnose infection nor to guide or monitor treatment.     Medical History: Past Medical History  Diagnosis Date  . Hypertension   . Hyperlipidemia   . Vitamin D deficiency   . Mitral regurgitation   . Asthma   . Shortness of breath dyspnea     occ  . UTI (lower urinary tract infection)   . GERD (gastroesophageal reflux disease)   . Arthritis     Assessment: 82 YOF admitted on 6/3 for planned exploration, removal of hardware, and lumbar three-four posterior lumbar interbody fusion to fix a disc herniation, stenosis, and instability. Post-op, it was confirmed with the RN that the patient has a drain in place. Pharmacy has been consulted to continue Vancomycin for surgical prophylaxis while the drain remains in place. The patient received a dose of Vancomycin 1g around 1730 this evening. SCr 0.79 from pre-op labs on 5/31, CrCl~50-55 ml/min  Goal of Therapy:  Vancomycin trough level 10-15 mcg/ml  Plan:  1. Start Vancomycin 1250 mg IV every 24 hours (starting on 6/4 around 1700) 2. Will get a SCr in the AM to further guide Vanc doses 3. Will monitor for drain removal and intended LOT  Georgina PillionElizabeth Kristoph Sattler, PharmD, BCPS Clinical Pharmacist Pager: [phone] 01/12/2015 10:25 PM

## 2015-01-12 NOTE — Addendum Note (Signed)
Addendum  created 01/12/15 2153 by Molli Hazardheresa M Jusitn Salsgiver, CRNA   Modules edited: Charges VN

## 2015-01-12 NOTE — Anesthesia Procedure Notes (Signed)
Procedure Name: Intubation Date/Time: 01/12/2015 5:36 PM Performed by: Dairl PonderJIANG, Chloee Tena Pre-anesthesia Checklist: Patient identified, Timeout performed, Emergency Drugs available, Suction available and Patient being monitored Patient Re-evaluated:Patient Re-evaluated prior to inductionOxygen Delivery Method: Circle system utilized Preoxygenation: Pre-oxygenation with 100% oxygen Intubation Type: IV induction Ventilation: Mask ventilation without difficulty Laryngoscope Size: Mac and 3 Grade View: Grade I Tube type: Oral Tube size: 7.0 mm Number of attempts: 1 Placement Confirmation: ETT inserted through vocal cords under direct vision,  breath sounds checked- equal and bilateral and positive ETCO2 Secured at: 22 cm Tube secured with: Tape Dental Injury: Teeth and Oropharynx as per pre-operative assessment

## 2015-01-12 NOTE — H&P (Signed)
Rhonda Watkins is an 81 y.o. female.   Chief Complaint: Back and right leg pain HPI: Patient is a very pleasant 81 year old female is a long-standing low back pain status post L4-5 fusion about 10 years ago she presented with worsening pain last few months progressing the right leg and at L3 and L4 nerve root pattern workup revealed large disc herniation above her fusion with severe stenosis and instability L3-4. Due to her failure conservative treatment imaging findings and progression of clinical syndrome I recommended reexploration of fusion removal of hardware at L4-5 and posterior lumbar interbody fusion L3-4. I extensively went over the risks and benefits of the operation with the patient as well as perioperative course expectations of outcome and alternatives surgery and she understands and agrees to proceed forward.  Past Medical History  Diagnosis Date  . Hypertension   . Hyperlipidemia   . Vitamin D deficiency   . Mitral regurgitation   . Asthma   . Shortness of breath dyspnea     occ  . UTI (lower urinary tract infection)   . GERD (gastroesophageal reflux disease)   . Arthritis     Past Surgical History  Procedure Laterality Date  . Coronary artery bypass graft    . Bladder repair  95,05  . Cholecyctectomy    . Bunionectomy Left 15  . Exploration middle ear Left     vertigo- no hearing now- 81 yrs old  . Back surgery  85,04  . Colon surgery  05  . Cholecystectomy  80  . Knee arthroscopy      lft 02 rt 09  . Hernia repair  [phone]    Family History  Problem Relation Age of Onset  . Cancer Mother   . Heart attack Father   . Coronary artery disease Father    Social History:  reports that she quit smoking about 56 years ago. She does not have any smokeless tobacco history on file. She reports that she drinks alcohol. She reports that she does not use illicit drugs.  Allergies:  Allergies  Allergen Reactions  . Amlodipine   . Avelox [Moxifloxacin Hcl In Nacl]   .  Crestor [Rosuvastatin]   . Dynacirc [Isradipine]   . Enablex [Darifenacin Hydrobromide Er]   . Latex   . Lescol [Fluvastatin Sodium]   . Lipitor [Atorvastatin]   . Lovastatin   . Mobic [Meloxicam]   . Norvasc [Amlodipine Besylate]   . Novocain [Procaine]   . Penicillins   . Pravachol [Pravastatin Sodium]   . Questran [Cholestyramine]   . Toprol Xl [Metoprolol Tartrate]   . Ultracet [Tramadol-Acetaminophen]   . Welchol [Colesevelam Hcl]   . Zetia [Ezetimibe]   . Zocor [Simvastatin]     Facility-administered medications prior to admission  Medication Dose Route Frequency Provider Last Rate Last Dose  . triamcinolone acetonide (KENALOG) 10 MG/ML injection 10 mg  10 mg Other Once Lenn Sink, DPM       Medications Prior to Admission  Medication Sig Dispense Refill  . aspirin 325 MG tablet Take 325 mg by mouth daily as needed.    . Cholecalciferol (VITAMIN D PO) Take 3,000 Units by mouth daily.     Marland Kitchen diltiazem (DILACOR XR) 180 MG 24 hr capsule Take 180 mg by mouth daily.    . furosemide (LASIX) 20 MG tablet Take 20 mg by mouth daily.    . mometasone (ASMANEX 120 METERED DOSES) 220 MCG/INH inhaler Inhale 2 puffs into the lungs daily.    Marland Kitchen  omeprazole (PRILOSEC) 20 MG capsule Take 20 mg by mouth daily.    Marland Kitchen. oxyCODONE-acetaminophen (PERCOCET) 7.5-325 MG per tablet Take 1 tablet by mouth every 4 (four) hours as needed for severe pain.    . polyethylene glycol (MIRALAX / GLYCOLAX) packet Take 17 g by mouth daily as needed for mild constipation.    . Potassium Chloride CR (MICRO-K) 8 MEQ CPCR capsule CR Take 8 mEq by mouth daily.     . ranitidine (ZANTAC) 300 MG capsule Take 300 mg by mouth every evening.    Marland Kitchen. telmisartan (MICARDIS) 80 MG tablet Take 80 mg by mouth daily.    . Ascorbic Acid (VITAMIN C) 1000 MG tablet Take 1,000 mg by mouth daily.    Marland Kitchen. aspirin 81 MG tablet Take 81 mg by mouth daily.    Marland Kitchen. azelastine (OPTIVAR) 0.05 % ophthalmic solution Place 1 drop into both eyes 2 (two)  times daily.    . Biotin 2.5 MG CAPS Take 2.5 mg by mouth daily.     . Cyanocobalamin (VITAMIN B 12) 100 MCG LOZG Take 100 mcg by mouth daily.     . Multiple Vitamins-Minerals (MULTI COMPLETE PO) Take 1 tablet by mouth daily.       No results found for this or any previous visit (from the past 48 hour(s)). No results found.  Review of Systems  Constitutional: Negative.   HENT: Negative.   Eyes: Negative.   Respiratory: Negative.   Cardiovascular: Negative.   Gastrointestinal: Negative.   Genitourinary: Negative.   Musculoskeletal: Positive for myalgias and back pain.  Skin: Negative.   Neurological: Positive for tingling and sensory change.  Endo/Heme/Allergies: Negative.   Psychiatric/Behavioral: Negative.     Blood pressure 152/57, pulse 72, temperature 97.6 F (36.4 C), temperature source Oral, resp. rate 18, weight 70.58 kg (155 lb 9.6 oz), SpO2 98 %. Physical Exam  Constitutional: She is oriented to person, place, and time. She appears well-developed and well-nourished.  HENT:  Head: Normocephalic.  Eyes: Pupils are equal, round, and reactive to light.  Neck: Normal range of motion.  Respiratory: Effort normal.  GI: Soft. Bowel sounds are normal.  Neurological: She is alert and oriented to person, place, and time. She has normal strength. GCS eye subscore is 4. GCS verbal subscore is 5. GCS motor subscore is 6.  Strength is 5 out of 5 in her iliopsoas, quads, hip she's, gastrocs, anterior tibialis, and EHL.     Assessment/Plan 81 year old female presents for an L3-4 posterior lumbar interbody fusion with removal of hardware L4-5.  Lamir Racca P 01/12/2015, 4:34 PM

## 2015-01-12 NOTE — Anesthesia Preprocedure Evaluation (Signed)
Anesthesia Evaluation  Patient identified by MRN, date of birth, ID band Patient awake    Reviewed: Allergy & Precautions, NPO status , Patient's Chart, lab work & pertinent test results  Airway Mallampati: II  TM Distance: >3 FB Neck ROM: Full    Dental   Pulmonary asthma , former smoker,  breath sounds clear to auscultation        Cardiovascular hypertension, Pt. on medications Rhythm:Regular Rate:Normal     Neuro/Psych negative neurological ROS     GI/Hepatic Neg liver ROS, GERD-  ,  Endo/Other  negative endocrine ROS  Renal/GU negative Renal ROS     Musculoskeletal  (+) Arthritis -,   Abdominal   Peds  Hematology negative hematology ROS (+)   Anesthesia Other Findings   Reproductive/Obstetrics                             Lab Results  Component Value Date   WBC 7.8 01/09/2015   HGB 15.5* 01/09/2015   HCT 47.0* 01/09/2015   MCV 91.1 01/09/2015   PLT 249 01/09/2015   Lab Results  Component Value Date   CREATININE 0.79 01/09/2015   BUN 13 01/09/2015   NA 138 01/09/2015   K 3.8 01/09/2015   CL 102 01/09/2015   CO2 24 01/09/2015    Anesthesia Physical Anesthesia Plan  ASA: II  Anesthesia Plan: General   Post-op Pain Management:    Induction: Intravenous  Airway Management Planned: Oral ETT  Additional Equipment:   Intra-op Plan:   Post-operative Plan: Extubation in OR  Informed Consent: I have reviewed the patients History and Physical, chart, labs and discussed the procedure including the risks, benefits and alternatives for the proposed anesthesia with the patient or authorized representative who has indicated his/her understanding and acceptance.   Dental advisory given  Plan Discussed with: CRNA  Anesthesia Plan Comments:         Anesthesia Quick Evaluation

## 2015-01-12 NOTE — Progress Notes (Signed)
Pt. Arrived to unit alert and oriented. Pt oriented to room and unit. Call bell at side. Will continue to monitor. Gara KronerHayles, Kischa Altice M, RN

## 2015-01-13 LAB — CREATININE, SERUM: Creatinine, Ser: 0.77 mg/dL (ref 0.44–1.00)

## 2015-01-13 NOTE — Evaluation (Signed)
Physical Therapy Evaluation Patient Details Name: CAPRICE MCCAFFREY MRN: [id number] DOB: [birth year] Today's Date: 01/13/2015   History of Present Illness  81 y.o. s/p Lumbar three-four posterior lumbar interbody fusion, exploration of lumbar fusion, and removal of hardware. Pt has had previous back surgeries.  Clinical Impression  Patient is s/p above surgery resulting in the deficits listed below (see PT Problem List).  Patient will benefit from skilled PT to increase their independence and safety with mobility (while adhering to their precautions) to allow discharge to the venue listed below.     Follow Up Recommendations No PT follow up;Supervision for mobility/OOB    Equipment Recommendations  None recommended by PT    Recommendations for Other Services       Precautions / Restrictions Precautions Precautions: Back;Fall Precaution Comments: Educated pt on 3/3 back precautions. Required Braces or Orthoses: Spinal Brace Spinal Brace: Lumbar corset;Applied in sitting position      Mobility  Bed Mobility       Sidelying to sit: Min assist       General bed mobility comments: verbal/tactile cues for logroll  Transfers   Equipment used: Rolling walker (2 wheeled)   Sit to Stand: Min guard         General transfer comment: verbal cues for hand placement and sequencing  Ambulation/Gait Ambulation/Gait assistance: Min guard Ambulation Distance (Feet): 30 Feet Assistive device: Rolling walker (2 wheeled) Gait Pattern/deviations: Step-through pattern;Decreased stride length Gait velocity: decreased   General Gait Details: slow and guarded  Stairs            Wheelchair Mobility    Modified Rankin (Stroke Patients Only)       Balance                                             Pertinent Vitals/Pain Pain Assessment: 0-10 Pain Score: 3  Pain Location: right lower back Pain Descriptors / Indicators: Jabbing Pain Intervention(s):  Monitored during session;Repositioned    Home Living Family/patient expects to be discharged to:: Private residence Living Arrangements: Spouse/significant other Available Help at Discharge: Family;Available 24 hours/day Type of Home: House Home Access: Stairs to enter   [employer] of Steps: 1 Home Layout: One level Home Equipment: Grab bars - toilet;Shower seat;Walker - 2 wheels;Adaptive equipment;Toilet riser      Prior Function Level of Independence: Independent               Hand Dominance   Dominant Hand: Right    Extremity/Trunk Assessment                         Communication   Communication: HOH  Cognition Arousal/Alertness: Awake/alert Behavior During Therapy: WFL for tasks assessed/performed Overall Cognitive Status: Within Functional Limits for tasks assessed                      General Comments      Exercises        Assessment/Plan    PT Assessment    PT Diagnosis Difficulty walking;Acute pain   PT Problem List    PT Treatment Interventions     PT Goals (Current goals can be found in the Care Plan section) Acute Rehab PT Goals Patient Stated Goal: to walk PT Goal Formulation: With patient Time For Goal Achievement: 01/20/15 Potential to Achieve Goals:  Good    Frequency     Barriers to discharge        Co-evaluation               End of Session Equipment Utilized During Treatment: Gait belt;Back brace Activity Tolerance: Patient tolerated treatment well Patient left: in chair;with call bell/phone within reach           Time: 1515-1541 PT Time Calculation (min) (ACUTE ONLY): 26 min   Charges:   PT Evaluation $Initial PT Evaluation Tier I: 1 Procedure PT Treatments $Therapeutic Activity: 8-22 mins   PT G Codes:        Ilda FoilGarrow, Antolin Belsito Rene 01/13/2015, 4:18 PM

## 2015-01-13 NOTE — Evaluation (Signed)
Occupational Therapy Evaluation Patient Details Name: Rhonda Watkins MRN: [id number] DOB: 07-Jul-1932 Today's Date: 01/13/2015    History of Present Illness 81 y.o. s/p Lumbar three-four posterior lumbar interbody fusion, exploration of lumbar fusion, and removal of hardware. Pt has had previous back surgeries.   Clinical Impression   Pt s/p above. Pt independent with ADLs, PTA. Feel pt will benefit from acute OT to increase independence with BADLs and reinforce precautions prior to d/c.     Follow Up Recommendations  No OT follow up;Supervision - Intermittent    Equipment Recommendations  3 in 1 bedside comode;Other (comment) (AE)    Recommendations for Other Services       Precautions / Restrictions Precautions Precautions: Back;Fall Precaution Booklet Issued: No Precaution Comments: educated on back precautions Required Braces or Orthoses: Spinal Brace Spinal Brace: Lumbar corset;Applied in sitting position Restrictions Weight Bearing Restrictions: No      Mobility Bed Mobility Overal bed mobility: Needs Assistance Bed Mobility: Rolling;Sidelying to Sit Rolling: Supervision Sidelying to sit: Supervision       General bed mobility comments: cues for log roll technique.  Transfers Overall transfer level: Needs assistance   Transfers: Sit to/from Stand Sit to Stand: Min guard         General transfer comment: cues for hand placement/technique.    Balance  Min guard for ambulation (appeared to have one instance of LOB when ambulating).                                           ADL Overall ADL's : Needs assistance/impaired     Grooming: Oral care;Set up;Supervision/safety;Standing           Upper Body Dressing : Minimal assistance;Sitting   Lower Body Dressing: Maximal assistance;Sit to/from stand   Toilet Transfer: Min guard;Ambulation;RW (chair)           Functional mobility during ADLs: Min guard;Rolling walker General  ADL Comments: Educated on back brace. Discussed incorporating precautions into functional activities such as use of cup for oral care and placement of grooming items as well as not reaching above shoulder level. Educated on safety. Briefly talked about shower transfer. Discussed getting 3 in 1 to make toilet higher. Recommended not sitting longer than 45 minutes to one hour at a time. Discussed positioning of pillows. pt reports she did not feel need to practice with AE as she understood how to do so.     Vision  Pt wears glasses.   Perception     Praxis      Pertinent Vitals/Pain Pain Assessment: 0-10 Pain Score: 1  Pain Location: right side of back when sitting Pain Descriptors / Indicators: Other (Comment) (pinching) Pain Intervention(s): Monitored during session;Repositioned;Relaxation     Hand Dominance Right   Extremity/Trunk Assessment Upper Extremity Assessment Upper Extremity Assessment: Overall WFL for tasks assessed   Lower Extremity Assessment Lower Extremity Assessment: Defer to PT evaluation       Communication Communication Communication: HOH   Cognition Arousal/Alertness: Awake/alert Behavior During Therapy: WFL for tasks assessed/performed Overall Cognitive Status: Within Functional Limits for tasks assessed                     General Comments       Exercises       Shoulder Instructions      Home Living Family/patient expects to be discharged to::  Private residence Living Arrangements: Spouse/significant other Available Help at Discharge: Family;Available 24 hours/day Type of Home: House Home Access: Stairs to enter [employer] CorporationEntrance Stairs-Number of Steps: 1   Home Layout: One level     Bathroom Shower/Tub: Walk-in shower;Tub only;Door (door on walk-in)   [employer] IndustriesBathroom Toilet: Standard     Home Equipment: Grab bars - toilet;Shower seat;Walker - 2 wheels;Adaptive equipment;Toilet riser Adaptive Equipment: Reacher        Prior  Functioning/Environment Level of Independence: Independent             OT Diagnosis: Acute pain;Generalized weakness   OT Problem List: Decreased strength;Decreased range of motion;Decreased knowledge of use of DME or AE;Decreased knowledge of precautions;Impaired balance (sitting and/or standing);Pain   OT Treatment/Interventions: Self-care/ADL training;DME and/or AE instruction;Therapeutic activities;Patient/family education;Balance training    OT Goals(Current goals can be found in the care plan section) Acute Rehab OT Goals Patient Stated Goal: to walk OT Goal Formulation: With patient Time For Goal Achievement: 01/20/15 Potential to Achieve Goals: Good ADL Goals Pt Will Transfer to Toilet: with modified independence;ambulating (3 in 1 over commode) Pt Will Perform Toileting - Clothing Manipulation and hygiene: with modified independence;sit to/from stand Pt Will Perform Tub/Shower Transfer: Shower transfer;with supervision;ambulating;rolling walker;shower seat Additional ADL Goal #1: Pt will independently verbalize 3/3 back precautions and maintain during ADLs/functional activities.  OT Frequency: Min 2X/week   Barriers to D/C:            Co-evaluation              End of Session Equipment Utilized During Treatment: Gait belt;Rolling walker;Back brace  Activity Tolerance: Patient tolerated treatment well Patient left: in chair;with call bell/phone within reach;with family/visitor present   Time: [id number] OT Time Calculation (min): 33 min Charges:  OT General Charges $OT Visit: 1 Procedure OT Evaluation $Initial OT Evaluation Tier I: 1 Procedure OT Treatments $Self Care/Home Management : 8-22 mins G-CodesEarlie Raveling:    Alva Kuenzel L OTR/L [phone] 01/13/2015, 10:54 AM

## 2015-01-13 NOTE — Progress Notes (Signed)
Patient ID: Rhonda Watkins, female   DOB: 03-29-1932, 81 y.o.   MRN: [id number] Afeb, vss No new neuro issues Legs feel better. Will start to increase activity today. Home next 1 to 2 days.

## 2015-01-13 NOTE — Progress Notes (Addendum)
Pt ambulated to the bathroom and has voided since Foley removal. Pt up to chair for breakfast, and back to bed immediately after.

## 2015-01-14 ENCOUNTER — Encounter (HOSPITAL_COMMUNITY): Payer: Self-pay | Admitting: General Practice

## 2015-01-14 MED ORDER — OXYCODONE-ACETAMINOPHEN 7.5-325 MG PO TABS: 1.0000 | ORAL_TABLET | ORAL | Status: DC | PRN

## 2015-01-14 MED ORDER — DOCUSATE SODIUM 100 MG PO CAPS
200.0000 mg | ORAL_CAPSULE | Freq: Two times a day (BID) | ORAL | Status: DC
  Administered 2015-01-14 – 2015-01-15 (×2): 200 mg via ORAL
  Filled 2015-01-14 (×2): qty 2

## 2015-01-14 MED ORDER — MAGNESIUM HYDROXIDE 400 MG/5ML PO SUSP
5.0000 mL | Freq: Every day | ORAL | Status: DC
  Administered 2015-01-14 – 2015-01-15 (×2): 5 mL via ORAL
  Filled 2015-01-14 (×2): qty 30

## 2015-01-14 NOTE — Progress Notes (Signed)
Physical Therapy Treatment Patient Details Name: Rhonda Watkins MRN: [id number] DOB: 1931/10/04 Today's Date: 01/14/2015    History of Present Illness 81 y.o. s/p Lumbar three-four posterior lumbar interbody fusion, exploration of lumbar fusion, and removal of hardware. Pt has had previous back surgeries.    PT Comments    Increased gait distance today. Pt progressing well.  Follow Up Recommendations  No PT follow up;Supervision for mobility/OOB     Equipment Recommendations  None recommended by PT    Recommendations for Other Services       Precautions / Restrictions Precautions Precautions: Back;Fall Precaution Booklet Issued: No Precaution Comments: Reviewed back precautions. Required Braces or Orthoses: Spinal Brace Spinal Brace: Lumbar corset;Applied in sitting position Restrictions Weight Bearing Restrictions: No    Mobility  Bed Mobility Overal bed mobility: Needs Assistance Bed Mobility: Rolling;Sidelying to Sit Rolling: Supervision Sidelying to sit: Supervision       General bed mobility comments: Pt up in recliner and returned to recliner after ambulation.  Transfers Overall transfer level: Needs assistance Equipment used: Rolling walker (2 wheeled) Transfers: Sit to/from Stand Sit to Stand: Min guard         General transfer comment: assist to boost for initial stand from bed.  Ambulation/Gait Ambulation/Gait assistance: Min guard Ambulation Distance (Feet): 70 Feet Assistive device: Rolling walker (2 wheeled) Gait Pattern/deviations: Step-through pattern;Decreased stride length Gait velocity: very slow Gait velocity interpretation: Below normal speed for age/gender General Gait Details: slow and guarded   Stairs            Wheelchair Mobility    Modified Rankin (Stroke Patients Only)       Balance                                    Cognition Arousal/Alertness: Awake/alert Behavior During Therapy: WFL for  tasks assessed/performed Overall Cognitive Status: Within Functional Limits for tasks assessed                      Exercises      General Comments        Pertinent Vitals/Pain Pain Assessment: 0-10 Pain Score: 1  Pain Location: back Pain Descriptors / Indicators: Sore Pain Intervention(s): Monitored during session;Repositioned;Relaxation    Home Living                      Prior Function            PT Goals (current goals can now be found in the care plan section) Acute Rehab PT Goals Patient Stated Goal: not stated PT Goal Formulation: With patient Time For Goal Achievement: 01/20/15 Potential to Achieve Goals: Good Progress towards PT goals: Progressing toward goals    Frequency  Min 5X/week    PT Plan Current plan remains appropriate    Co-evaluation             End of Session Equipment Utilized During Treatment: Gait belt;Back brace Activity Tolerance: Patient tolerated treatment well Patient left: in chair;with call bell/phone within reach     Time: 0925-0948 PT Time Calculation (min) (ACUTE ONLY): 23 min  Charges:  $Gait Training: 23-37 mins                    G Codes:      Ilda FoilGarrow, Auset Fritzler Rene 01/14/2015, 11:32 AM

## 2015-01-14 NOTE — Progress Notes (Addendum)
Occupational Therapy Treatment Patient Details Name: LEEN TWOREK MRN: [id number] DOB: [birth year] Today's Date: 01/14/2015    History of present illness 81 y.o. s/p Lumbar three-four posterior lumbar interbody fusion, exploration of lumbar fusion, and removal of hardware. Pt has had previous back surgeries.   OT comments  Pt progressing and pt/spouse in agreement for OT sign off on pt.   Follow Up Recommendations  No OT follow up;Supervision - Intermittent    Equipment Recommendations  None recommended by OT    Recommendations for Other Services      Precautions / Restrictions Precautions Precautions: Back;Fall Precaution Booklet Issued: No Precaution Comments: Reviewed back precautions. Required Braces or Orthoses: Spinal Brace Spinal Brace: Lumbar corset;Applied in sitting position (adjusted in standing) Restrictions Weight Bearing Restrictions: No       Mobility Bed Mobility Overal bed mobility: Needs Assistance Bed Mobility: Rolling;Sidelying to Sit Rolling: Supervision Sidelying to sit: Supervision       General bed mobility comments: cues for technique. Did not use rail/HOB slightly elevated.  Transfers Overall transfer level: Needs assistance   Transfers: Sit to/from Stand Sit to Stand: Min guard;Min assist         General transfer comment: assist to boost for initial stand from bed.    Balance  Min guard for ambulation.  Pt reports she feels herself swaying when letting go of walker (during toileting tasks)-Min guard.                             ADL Overall ADL's : Needs assistance/impaired     Grooming: Wash/dry face;Oral care;Set up;Supervision/safety;Standing                   Toilet Transfer: Min guard;Ambulation;RW (3 in 1 over commode)   Toileting- Clothing Manipulation and Hygiene: Minimal assistance;Sit to/from stand   Tub/ Shower Transfer: Walk-in shower;Min guard;Ambulation;Rolling walker   Functional mobility  during ADLs: Min guard;Rolling walker General ADL Comments: Educated on shower transfer technique and also explained another technique for tub, but do not think it will work due to bars on the tub. Educated/reviewed safety tips such as safe footwear, use of bag on walker, and recommended spouse be with her for shower transfer. Recommended pt use walker for now for shower transfer. Discussed back brace. Reviewed placement of grooming items and use of cup for oral care.  Discussed positioning of pillows and to not sit longer than 45 minutes to one hour at a time. Spouse purchased AE kit and toilet aide (pt reports understanding of this so did not practice). Cues for precautions in session.      Vision                     Perception     Praxis      Cognition  Awake/Alert Behavior During Therapy: WFL for tasks assessed/performed Overall Cognitive Status: Within Functional Limits for tasks assessed                       Extremity/Trunk Assessment               Exercises     Shoulder Instructions       General Comments      Pertinent Vitals/ Pain       Pain Assessment: 0-10 Pain Score: 1  Pain Location: back Pain Descriptors / Indicators: Sore Pain Intervention(s): Monitored during session;Repositioned;Relaxation  Home Living  Prior Functioning/Environment              Frequency Min 2X/week     Progress Toward Goals  OT Goals(current goals can now be found in the care plan section)  Progress towards OT goals: Progressing toward goals-Discharging from OT (pt/spouse in agreement)  Acute Rehab OT Goals Patient Stated Goal: not stated OT Goal Formulation: With patient Time For Goal Achievement: 01/20/15 Potential to Achieve Goals: Good ADL Goals Pt Will Transfer to Toilet: with modified independence;ambulating (3 in 1 over commode) Pt Will Perform Toileting - Clothing Manipulation and  hygiene: with modified independence;sit to/from stand Pt Will Perform Tub/Shower Transfer: Shower transfer;with supervision;ambulating;rolling walker;shower seat Additional ADL Goal #1: Pt will independently verbalize 3/3 back precautions and maintain during ADLs/functional activities.  Plan Discharge plan remains appropriate    Co-evaluation                 End of Session Equipment Utilized During Treatment: Gait belt;Rolling walker;Back brace   Activity Tolerance Patient tolerated treatment well   Patient Left in chair;with call bell/phone within reach;Other (comment) (PT came in at end of session)   Nurse Communication          Time: [phone] OT Time Calculation (min): 33 min  Charges: OT General Charges $OT Visit: 1 Procedure OT Treatments $Self Care/Home Management : 23-37 mins    Benito Mccreedy OTR/L [phone] 01/14/2015, 9:37 AM

## 2015-01-14 NOTE — Progress Notes (Signed)
Pt ambulated appx 50 feet in the hallway this am with brace and walker.

## 2015-01-14 NOTE — Discharge Summary (Signed)
  Physician Discharge Summary  Patient ID: Rhonda Watkins MRN: [id number] DOB/AGE: Jan 24, 1932 81 y.o.  Admit date: 01/12/2015 Discharge date: 01/14/2015  Admission Diagnoses: Lumbar disc herniation  Discharge Diagnoses: Same Active Problems:   HNP (herniated nucleus pulposus), lumbar   Discharged Condition: Stable  Hospital Course:  Mrs. Rhonda SlimClara J Intrieri is a 81 y.o. female electively admitted after lumbar decompression and fusion. She was at neurologic baseline postop. Her recovery was uneventful. Back pain was controlled with oral medication, she was ambulating without difficulty, voiding normally, and tolerating diet.  Treatments: Surgery - L3-4 PLIF  Discharge Exam: Blood pressure 111/99, pulse 80, temperature 97.9 F (36.6 C), temperature source Oral, resp. rate 18, height 5\' 2"  (1.575 m), weight 72 kg (158 lb 11.7 oz), SpO2 100 %. Awake, alert, oriented Speech fluent, appropriate CN grossly intact 5/5 BUE/BLE Wound c/d/i  Disposition: Home     Medication List    TAKE these medications        ASMANEX 120 METERED DOSES 220 MCG/INH inhaler  Generic drug:  mometasone  Inhale 2 puffs into the lungs daily.     aspirin 81 MG tablet  Take 81 mg by mouth daily.     azelastine 0.05 % ophthalmic solution  Commonly known as:  OPTIVAR  Place 1 drop into both eyes 2 (two) times daily.     Biotin 2.5 MG Caps  Take 2.5 mg by mouth daily.     diltiazem 180 MG 24 hr capsule  Commonly known as:  DILACOR XR  Take 180 mg by mouth daily.     furosemide 20 MG tablet  Commonly known as:  LASIX  Take 20 mg by mouth daily.     MICRO-K 8 MEQ Cpcr capsule CR  Generic drug:  Potassium Chloride CR  Take 8 mEq by mouth daily.     MULTI COMPLETE PO  Take 1 tablet by mouth daily.     omeprazole 20 MG capsule  Commonly known as:  PRILOSEC  Take 20 mg by mouth daily.     oxyCODONE-acetaminophen 7.5-325 MG per tablet  Commonly known as:  PERCOCET  Take 1-2 tablets by mouth every  4 (four) hours as needed for severe pain.     polyethylene glycol packet  Commonly known as:  MIRALAX / GLYCOLAX  Take 17 g by mouth daily as needed for mild constipation.     ranitidine 300 MG capsule  Commonly known as:  ZANTAC  Take 300 mg by mouth every evening.     telmisartan 80 MG tablet  Commonly known as:  MICARDIS  Take 80 mg by mouth daily.     Vitamin B 12 100 MCG Lozg  Take 100 mcg by mouth daily.     vitamin C 1000 MG tablet  Take 1,000 mg by mouth daily.     VITAMIN D PO  Take 3,000 Units by mouth daily.           Follow-up Information    Follow up with CRAM,GARY P, MD In 2 weeks.   Specialty:  Neurosurgery   Contact information:   [address] [place] [address] [phone]       Signed: Jackelyn HoehnUNDKUMAR, Autry Droege, C 01/14/2015, 12:13 PM

## 2015-01-15 NOTE — Progress Notes (Signed)
1/2 tablet oxy IR wasted in sharps container.  Witnessed by Karin GoldenKahin Kahin RN. Sondra ComeSilva, Scarlettrose Costilow M, RN

## 2015-01-15 NOTE — Clinical Social Work Note (Signed)
CSW consult acknowledged:  Clinical Social Worker received consult in reference to SNF placement. PT/OT is currently recommending "no PT/OT follow up". Patient to discharge home.   Clinical Social Worker will sign off for now as social work intervention is no longer needed. Please consult us again if new need arises.  Rhonda Watkins, MSW, LCSWA [phone] 01/15/2015 10:49 AM

## 2015-01-15 NOTE — Progress Notes (Signed)
Physical Therapy Treatment Patient Details Name: Rhonda Watkins MRN: [id number] DOB: 1932/01/18 Today's Date: 01/15/2015    History of Present Illness 81 y.o. s/p Lumbar three-four posterior lumbar interbody fusion, exploration of lumbar fusion, and removal of hardware. Pt has had previous back surgeries.    PT Comments    Pt tolerated ambulation in hall today despite increased pain. Educated patient regarding expectations for mobility upon discharge as well as technique for car transfers. Patient receptive. Patient is concerned regarding ability to manage pain. Will continue to see and progress as tolerated.  Follow Up Recommendations  No PT follow up;Supervision for mobility/OOB     Equipment Recommendations  None recommended by PT    Recommendations for Other Services       Precautions / Restrictions Precautions Precautions: Back;Fall Precaution Booklet Issued: No Precaution Comments: Reviewed back precautions. Required Braces or Orthoses: Spinal Brace Spinal Brace: Lumbar corset;Applied in sitting position Restrictions Weight Bearing Restrictions: No    Mobility  Bed Mobility Overal bed mobility: Needs Assistance Bed Mobility: Rolling;Sidelying to Sit Rolling: Supervision Sidelying to sit: Supervision          Transfers Overall transfer level: Needs assistance Equipment used: Rolling walker (2 wheeled) Transfers: Sit to/from Stand Sit to Stand: Min guard         General transfer comment: performed from bed, and toilet  Ambulation/Gait Ambulation/Gait assistance: Supervision Ambulation Distance (Feet): 140 Feet Assistive device: Rolling walker (2 wheeled) Gait Pattern/deviations: Step-through pattern;Decreased stride length Gait velocity: very slow Gait velocity interpretation: Below normal speed for age/gender General Gait Details: slow and guarded   Stairs            Wheelchair Mobility    Modified Rankin (Stroke Patients Only)        Balance Overall balance assessment: Needs assistance Sitting-balance support: Feet supported Sitting balance-Leahy Scale: Good       Standing balance-Leahy Scale: Fair Standing balance comment: use of RW                    Cognition Arousal/Alertness: Awake/alert Behavior During Therapy: WFL for tasks assessed/performed Overall Cognitive Status: Within Functional Limits for tasks assessed                      Exercises      General Comments General comments (skin integrity, edema, etc.): Educated verbally regarding technique for car transfers in anticipation for discharge home. Patient verbalizes understanding.      Pertinent Vitals/Pain Pain Assessment: Faces Faces Pain Scale: Hurts even more Pain Location: back  Pain Descriptors / Indicators: Sore Pain Intervention(s): Monitored during session;Repositioned;Relaxation    Home Living                      Prior Function            PT Goals (current goals can now be found in the care plan section) Acute Rehab PT Goals Patient Stated Goal: not stated PT Goal Formulation: With patient Time For Goal Achievement: 01/20/15 Potential to Achieve Goals: Good Progress towards PT goals: Progressing toward goals    Frequency  Min 5X/week    PT Plan Current plan remains appropriate    Co-evaluation             End of Session Equipment Utilized During Treatment: Gait belt;Back brace Activity Tolerance: Patient tolerated treatment well;Patient limited by pain Patient left: in chair;with call bell/phone within reach     Time: [phone]  PT Time Calculation (min) (ACUTE ONLY): 17 min  Charges:  $Gait Training: 8-22 mins                    G CodesFabio Asa 02/12/2015, 9:10 AM Charlotte Crumb, PT DPT  [phone]

## 2015-01-15 NOTE — Progress Notes (Signed)
Discharge order received. Vital Signs stable.  Education provided with verbalize comprehension. Belongings sent with pt. Discharge home with Family (spouse) present.  Karin GoldenKahin Amarra Sawyer, RN

## 2015-01-15 NOTE — Progress Notes (Signed)
Patient ID: Rhonda Watkins, female   DOB: Jan 11, 1932, 81 y.o.   MRN: [id number] D/c home

## 2015-01-16 MED FILL — Sodium Chloride IV Soln 0.9%: INTRAVENOUS | Qty: 1000 | Status: AC

## 2015-01-16 MED FILL — Heparin Sodium (Porcine) Inj 1000 Unit/ML: INTRAMUSCULAR | Qty: 30 | Status: AC

## 2015-01-30 DIAGNOSIS — M719 Bursopathy, unspecified: Secondary | ICD-10-CM | POA: Insufficient documentation

## 2015-03-01 DIAGNOSIS — M5020 Other cervical disc displacement, unspecified cervical region: Secondary | ICD-10-CM | POA: Insufficient documentation

## 2015-05-07 ENCOUNTER — Ambulatory Visit (INDEPENDENT_AMBULATORY_CARE_PROVIDER_SITE_OTHER): Payer: Medicare Other | Admitting: Podiatry

## 2015-05-07 ENCOUNTER — Encounter: Payer: Self-pay | Admitting: Podiatry

## 2015-05-07 ENCOUNTER — Ambulatory Visit: Payer: Self-pay

## 2015-05-07 VITALS — BP 156/80 | HR 73 | Resp 16

## 2015-05-07 DIAGNOSIS — M779 Enthesopathy, unspecified: Secondary | ICD-10-CM

## 2015-05-07 DIAGNOSIS — M79675 Pain in left toe(s): Secondary | ICD-10-CM

## 2015-05-07 DIAGNOSIS — M2042 Other hammer toe(s) (acquired), left foot: Secondary | ICD-10-CM

## 2015-05-07 MED ORDER — TRIAMCINOLONE ACETONIDE 10 MG/ML IJ SUSP
10.0000 mg | Freq: Once | INTRAMUSCULAR | Status: AC
Start: 2015-05-07 — End: 2015-05-07
  Administered 2015-05-07: 10 mg

## 2015-05-07 NOTE — Progress Notes (Signed)
Subjective:     Patient ID: Rhonda Watkins, female   DOB: 1932-07-13, 81 y.o.   MRN: [id number]  HPI patient presents with inflammation of the left fifth toe at the interphalangeal joint with small keratotic lesion formation. It is irritated when pressed and makes it hard to wear shoe gear   Review of Systems     Objective:   Physical Exam Neurovascular status found to be intact muscle strength adequate with rotated fifth toe left and fluid buildup around the interphalangeal joint with pain    Assessment:     Inflammatory capsulitis interphalangeal joint fifth toe left foot with overlying capsulitis and hammertoe deformity    Plan:     H&P and condition reviewed with patient. At this point I did a careful interphalangeal injection 2 mg done and some Kenalog 5 mg Xylocaine and applied padding and discussed arthroplasty of symptoms persist. Reviewed hammertoe deformity and also x-rays associated with

## 2015-06-27 ENCOUNTER — Ambulatory Visit (INDEPENDENT_AMBULATORY_CARE_PROVIDER_SITE_OTHER): Payer: Medicare Other | Admitting: Podiatry

## 2015-06-27 ENCOUNTER — Encounter: Payer: Self-pay | Admitting: Podiatry

## 2015-06-27 VITALS — BP 152/75 | HR 65 | Resp 16

## 2015-06-27 DIAGNOSIS — L84 Corns and callosities: Secondary | ICD-10-CM

## 2015-06-27 DIAGNOSIS — M2042 Other hammer toe(s) (acquired), left foot: Secondary | ICD-10-CM

## 2015-06-27 NOTE — Progress Notes (Signed)
Subjective:     Patient ID: Rhonda Watkins, female   DOB: [birth year], 81 y.o.   MRN: [id number]  HPI patient presents with painful fifth toe left that only got better for a little while and ingrown toenail left big toe   Review of Systems     Objective:   Physical Exam Neurovascular status intact muscle strength adequate with keratotic lesion fifth digit left foot that's painful with digital deformity and rotation of the fifth toe noted. Mild ingrown toenail right big toe    Assessment:     Hammertoe deformity with keratotic lesion left fifth toe and ingrown toenail left    Plan:     Reviewed both conditions and arthroplasty which I believe will be necessary. Today I debrided the lesion padded and cut out the ingrown toenail we'll see the results and decide if anything else is necessary

## 2015-07-25 ENCOUNTER — Other Ambulatory Visit: Payer: Self-pay | Admitting: Neurology

## 2015-07-25 MED ORDER — MOMETASONE FUROATE 220 MCG/INH IN AEPB: 2.0000 | INHALATION_SPRAY | Freq: Every day | RESPIRATORY_TRACT | Status: DC

## 2015-10-02 DIAGNOSIS — M5416 Radiculopathy, lumbar region: Secondary | ICD-10-CM | POA: Diagnosis not present

## 2015-10-02 DIAGNOSIS — M5137 Other intervertebral disc degeneration, lumbosacral region: Secondary | ICD-10-CM | POA: Diagnosis not present

## 2015-10-02 DIAGNOSIS — M4806 Spinal stenosis, lumbar region: Secondary | ICD-10-CM | POA: Diagnosis not present

## 2015-10-17 DIAGNOSIS — R350 Frequency of micturition: Secondary | ICD-10-CM | POA: Diagnosis not present

## 2015-10-17 DIAGNOSIS — N3946 Mixed incontinence: Secondary | ICD-10-CM | POA: Diagnosis not present

## 2015-10-17 DIAGNOSIS — Z0000 Encounter for general adult medical examination without abnormal findings: Secondary | ICD-10-CM | POA: Diagnosis not present

## 2015-10-30 DIAGNOSIS — H2513 Age-related nuclear cataract, bilateral: Secondary | ICD-10-CM | POA: Diagnosis not present

## 2015-11-08 DIAGNOSIS — H2511 Age-related nuclear cataract, right eye: Secondary | ICD-10-CM | POA: Diagnosis not present

## 2015-11-08 DIAGNOSIS — H25811 Combined forms of age-related cataract, right eye: Secondary | ICD-10-CM | POA: Diagnosis not present

## 2015-11-30 ENCOUNTER — Other Ambulatory Visit: Payer: Self-pay | Admitting: Nurse Practitioner

## 2015-11-30 ENCOUNTER — Ambulatory Visit
Admission: RE | Admit: 2015-11-30 | Discharge: 2015-11-30 | Disposition: A | Discharge status: Home / Self Care | Payer: Medicare Other | Source: Ambulatory Visit | Attending: Nurse Practitioner | Admitting: Nurse Practitioner

## 2015-11-30 DIAGNOSIS — Z23 Encounter for immunization: Secondary | ICD-10-CM | POA: Diagnosis not present

## 2015-11-30 DIAGNOSIS — K5901 Slow transit constipation: Secondary | ICD-10-CM | POA: Diagnosis not present

## 2015-11-30 DIAGNOSIS — M7989 Other specified soft tissue disorders: Secondary | ICD-10-CM | POA: Diagnosis not present

## 2015-11-30 DIAGNOSIS — E119 Type 2 diabetes mellitus without complications: Secondary | ICD-10-CM | POA: Diagnosis not present

## 2015-11-30 DIAGNOSIS — M79644 Pain in right finger(s): Secondary | ICD-10-CM | POA: Diagnosis not present

## 2015-11-30 DIAGNOSIS — I10 Essential (primary) hypertension: Secondary | ICD-10-CM | POA: Diagnosis not present

## 2015-12-06 DIAGNOSIS — H2512 Age-related nuclear cataract, left eye: Secondary | ICD-10-CM | POA: Diagnosis not present

## 2015-12-06 DIAGNOSIS — H25812 Combined forms of age-related cataract, left eye: Secondary | ICD-10-CM | POA: Diagnosis not present

## 2015-12-20 DIAGNOSIS — N390 Urinary tract infection, site not specified: Secondary | ICD-10-CM | POA: Diagnosis not present

## 2015-12-24 DIAGNOSIS — D2372 Other benign neoplasm of skin of left lower limb, including hip: Secondary | ICD-10-CM | POA: Diagnosis not present

## 2015-12-24 DIAGNOSIS — D1801 Hemangioma of skin and subcutaneous tissue: Secondary | ICD-10-CM | POA: Diagnosis not present

## 2015-12-24 DIAGNOSIS — L821 Other seborrheic keratosis: Secondary | ICD-10-CM | POA: Diagnosis not present

## 2015-12-24 DIAGNOSIS — D2272 Melanocytic nevi of left lower limb, including hip: Secondary | ICD-10-CM | POA: Diagnosis not present

## 2015-12-24 DIAGNOSIS — D692 Other nonthrombocytopenic purpura: Secondary | ICD-10-CM | POA: Diagnosis not present

## 2016-01-01 DIAGNOSIS — M5137 Other intervertebral disc degeneration, lumbosacral region: Secondary | ICD-10-CM | POA: Diagnosis not present

## 2016-01-09 DIAGNOSIS — Z961 Presence of intraocular lens: Secondary | ICD-10-CM | POA: Diagnosis not present

## 2016-01-10 ENCOUNTER — Other Ambulatory Visit: Payer: Self-pay | Admitting: *Deleted

## 2016-01-10 MED ORDER — MOMETASONE FUROATE 220 MCG/INH IN AEPB: 2.0000 | INHALATION_SPRAY | Freq: Every day | RESPIRATORY_TRACT | Status: DC

## 2016-01-11 DIAGNOSIS — I10 Essential (primary) hypertension: Secondary | ICD-10-CM | POA: Diagnosis not present

## 2016-01-11 DIAGNOSIS — E119 Type 2 diabetes mellitus without complications: Secondary | ICD-10-CM | POA: Diagnosis not present

## 2016-01-11 DIAGNOSIS — Z7984 Long term (current) use of oral hypoglycemic drugs: Secondary | ICD-10-CM | POA: Diagnosis not present

## 2016-01-11 DIAGNOSIS — T148 Other injury of unspecified body region: Secondary | ICD-10-CM | POA: Diagnosis not present

## 2016-01-23 ENCOUNTER — Ambulatory Visit (INDEPENDENT_AMBULATORY_CARE_PROVIDER_SITE_OTHER): Payer: Medicare Other | Admitting: Allergy and Immunology

## 2016-01-23 ENCOUNTER — Encounter: Payer: Self-pay | Admitting: Allergy and Immunology

## 2016-01-23 VITALS — BP 148/90 | HR 64 | Resp 16 | Ht 61.0 in | Wt 165.4 lb

## 2016-01-23 DIAGNOSIS — J309 Allergic rhinitis, unspecified: Secondary | ICD-10-CM | POA: Diagnosis not present

## 2016-01-23 DIAGNOSIS — J387 Other diseases of larynx: Secondary | ICD-10-CM | POA: Diagnosis not present

## 2016-01-23 DIAGNOSIS — J4530 Mild persistent asthma, uncomplicated: Secondary | ICD-10-CM

## 2016-01-23 DIAGNOSIS — K219 Gastro-esophageal reflux disease without esophagitis: Secondary | ICD-10-CM

## 2016-01-23 DIAGNOSIS — R600 Localized edema: Secondary | ICD-10-CM

## 2016-01-23 DIAGNOSIS — H1010 Acute atopic conjunctivitis, unspecified eye: Secondary | ICD-10-CM | POA: Diagnosis not present

## 2016-01-23 NOTE — Patient Instructions (Signed)
  1. Continue Asmanex 220 one inhalation 1 time per day  2. Continue Prilosec 20 mg in a.m. plus ranitidine 300 mg in PM  3. Continue ProAir HFA 2 puffs every 4-6 hours if needed  4. Obtain fall flu vaccine this September  5. Discuss with Dr. Pete GlatterStoneking about possible calcium channel blocker side effect  6. Return to clinic in 6 months or earlier if problem

## 2016-01-23 NOTE — Progress Notes (Signed)
Follow-up Note  Referring Provider: Merlene Laughter, MD Primary Provider: Ginette Otto, MD Date of Office Visit: 01/23/2016  Subjective:   Rhonda Watkins (DOB: 21-Jan-1932) is a 82 y.o. female [facility] in re-evaluation of the following:  HPI: Rhonda Watkins returns to this clinic in reevaluation of her asthma, allergic rhinoconjunctivitis and LPR. I have not seen her in his clinic since September 2016.  During the interval she has done very well. She has not had an exacerbation of her asthma requiring a systemic steroid. She rarely uses a short acting bronchodilator. She really does not exercise to any large degree. She has been consistently using Asmanex.  She's not been having any problems with her nose or eyes and does not need to use any specific medications for this issue  She has not been having any problem with her throat or her reflux while consistently using her Prilosec and omeprazole. If she does miss a Prilosec she will get a little bit of a raspy voice.  Chantella has developed rather significant lower extremity edema over the course of the past 6 months or so. She has raised this issue with Dr. Pete Glatter who assured her that her circulation was good. She has tried to use compression stockings but they're just too uncomfortable. She does use a calcium channel blocker for treatment of her hypertension.    Medication List           aspirin 81 MG tablet  Take 81 mg by mouth daily.     atorvastatin 10 MG tablet  Commonly known as:  LIPITOR  Take 10 mg by mouth daily.     Biotin 2.5 MG Caps  Take 2.5 mg by mouth daily.     diltiazem 180 MG 24 hr capsule  Commonly known as:  DILACOR XR  Take 180 mg by mouth daily.     furosemide 20 MG tablet  Commonly known as:  LASIX  Take 20 mg by mouth daily.     MICRO-K 8 MEQ Cpcr capsule CR  Generic drug:  Potassium Chloride CR  Take 8 mEq by mouth daily.     mometasone 220  MCG/INH inhaler  Commonly known as:  ASMANEX 120 METERED DOSES  Inhale 2 puffs into the lungs daily.     MULTI COMPLETE PO  Take 1 tablet by mouth daily.     omeprazole 20 MG capsule  Commonly known as:  PRILOSEC  Take 20 mg by mouth daily.     ranitidine 300 MG capsule  Commonly known as:  ZANTAC  Take 300 mg by mouth every evening.     telmisartan 80 MG tablet  Commonly known as:  MICARDIS  Take 80 mg by mouth daily.     Vitamin B 12 100 MCG Lozg  Take 100 mcg by mouth daily.     vitamin C 1000 MG tablet  Take 1,000 mg by mouth daily.     VITAMIN D PO  Take 3,000 Units by mouth daily.        Past Medical History  Diagnosis Date  . Hypertension   . Hyperlipidemia   . Vitamin D deficiency   . Mitral regurgitation   . Asthma   . Shortness of breath dyspnea     occ  . UTI (lower urinary tract infection)   . GERD (gastroesophageal reflux disease)   . Arthritis     Past Surgical History  Procedure Laterality Date  .  Bladder repair  95,05  . Cholecyctectomy    . Bunionectomy Left 15  . Exploration middle ear Left     vertigo- no hearing now- 82 yrs old  . Back surgery  85,04  . Colon surgery  05  . Cholecystectomy  80  . Knee arthroscopy      lft 02 rt 09  . Hernia repair  16,10,96    Allergies  Allergen Reactions  . Amlodipine   . Avelox [Moxifloxacin Hcl In Nacl]   . Crestor [Rosuvastatin]   . Dynacirc [Isradipine]   . Enablex [Darifenacin Hydrobromide Er]   . Latex   . Lescol [Fluvastatin Sodium]   . Lipitor [Atorvastatin]   . Lovastatin   . Mobic [Meloxicam]   . Norvasc [Amlodipine Besylate]   . Novocain [Procaine]   . Penicillins   . Pravachol [Pravastatin Sodium]   . Questran [Cholestyramine]   . Toprol Xl [Metoprolol Tartrate]   . Ultracet [Tramadol-Acetaminophen]   . Welchol [Colesevelam Hcl]   . Zetia [Ezetimibe]   . Zocor [Simvastatin]     Review of systems negative except as noted in HPI / PMHx or noted below:  Review of  Systems  Constitutional: Negative.   HENT: Negative.   Eyes: Negative.   Respiratory: Negative.   Cardiovascular: Negative.   Gastrointestinal: Negative.   Genitourinary: Negative.   Musculoskeletal: Negative.   Skin: Negative.   Neurological: Negative.   Endo/Heme/Allergies: Negative.   Psychiatric/Behavioral: Negative.      Objective:   Filed Vitals:   01/23/16 1019  BP: 148/90  Pulse: 64  Resp: 16   Height: 5\' 1"  (154.9 cm)  Weight: 165 lb 6.4 oz (75.025 kg)   Physical Exam  Constitutional: She is well-developed, well-nourished, and in no distress.  HENT:  Head: Normocephalic.  Right Ear: Tympanic membrane, external ear and ear canal normal.  Left Ear: Tympanic membrane, external ear and ear canal normal.  Nose: Nose normal. No mucosal edema or rhinorrhea.  Mouth/Throat: Uvula is midline, oropharynx is clear and moist and mucous membranes are normal. No oropharyngeal exudate.  Eyes: Conjunctivae are normal.  Neck: Trachea normal. No tracheal tenderness present. No tracheal deviation present. No thyromegaly present.  Cardiovascular: Normal rate, regular rhythm, S1 normal, S2 normal and normal heart sounds.   No murmur heard. Pulmonary/Chest: Breath sounds normal. No stridor. No respiratory distress. She has no wheezes. She has no rales.  Musculoskeletal: She exhibits edema (Extensive edema of ankles and shins with slight pitting quality).  Lymphadenopathy:       Head (right side): No tonsillar adenopathy present.       Head (left side): No tonsillar adenopathy present.    She has no cervical adenopathy.  Neurological: She is alert. Gait normal.  Skin: No rash noted. She is not diaphoretic. No erythema. Nails show no clubbing.  Psychiatric: Mood and affect normal.    Diagnostics:    Spirometry was performed and demonstrated an FEV1 of 1.90 at 108 % of predicted.  The patient had an Asthma Control Test with the following results:  .    Assessment and Plan:    1. Asthma, well controlled, mild persistent   2. LPRD (laryngopharyngeal reflux disease)   3. Allergic rhinoconjunctivitis   4. Bilateral edema of lower extremity     1. Continue Asmanex 220 one inhalation 1 time per day  2. Continue Prilosec 20 mg in a.m. plus ranitidine 300 mg in PM  3. Continue ProAir HFA 2 puffs every 4-6 hours if  needed  4. Obtain fall flu vaccine this September  5. Discuss with Dr. Pete GlatterStoneking about possible calcium channel blocker side effect  6. Return to clinic in 6 months or earlier if problem  Alan RipperClaire is doing well from a respiratory standpoint and I see no need for altering her medical therapy and she will continue to use anti-inflammatory agents for her lower airway as well as therapy directed against reflux as noted above. Concerning her lower extremity edema I have asked her to discuss with Dr. Pete GlatterStoneking about the possibility that she has developed a problem with the use of her calcium channel blocker. I will see her back in this clinic in 6 months or earlier if there is a problem.  Laurette SchimkeEric Kozlow, MD [facility]

## 2016-02-04 DIAGNOSIS — M25561 Pain in right knee: Secondary | ICD-10-CM | POA: Diagnosis not present

## 2016-02-21 DIAGNOSIS — R600 Localized edema: Secondary | ICD-10-CM | POA: Diagnosis not present

## 2016-02-21 DIAGNOSIS — I10 Essential (primary) hypertension: Secondary | ICD-10-CM | POA: Diagnosis not present

## 2016-02-21 DIAGNOSIS — Z79899 Other long term (current) drug therapy: Secondary | ICD-10-CM | POA: Diagnosis not present

## 2016-04-03 ENCOUNTER — Other Ambulatory Visit: Payer: Self-pay | Admitting: Allergy and Immunology

## 2016-04-03 ENCOUNTER — Other Ambulatory Visit: Payer: Self-pay | Admitting: *Deleted

## 2016-04-03 MED ORDER — MOMETASONE FUROATE 220 MCG/INH IN AEPB: 2.0000 | INHALATION_SPRAY | Freq: Every day | RESPIRATORY_TRACT | 3 refills | Status: DC

## 2016-04-03 MED ORDER — MOMETASONE FUROATE 220 MCG/INH IN AEPB: INHALATION_SPRAY | RESPIRATORY_TRACT | 3 refills | Status: DC

## 2016-04-03 NOTE — Telephone Encounter (Signed)
Sent in Asmanex 220 with 3 refills vm left

## 2016-04-03 NOTE — Telephone Encounter (Signed)
Pt called and needs asmanex 120 called into [facility]. [phone].

## 2016-04-08 ENCOUNTER — Other Ambulatory Visit: Payer: Self-pay | Admitting: *Deleted

## 2016-04-08 MED ORDER — RANITIDINE HCL 300 MG PO CAPS: 300.0000 mg | ORAL_CAPSULE | Freq: Every evening | ORAL | 3 refills | Status: DC

## 2016-04-30 DIAGNOSIS — Z23 Encounter for immunization: Secondary | ICD-10-CM | POA: Diagnosis not present

## 2016-05-02 ENCOUNTER — Encounter: Payer: Self-pay | Admitting: Podiatry

## 2016-05-02 ENCOUNTER — Ambulatory Visit (INDEPENDENT_AMBULATORY_CARE_PROVIDER_SITE_OTHER): Payer: Medicare Other | Admitting: Podiatry

## 2016-05-02 DIAGNOSIS — B351 Tinea unguium: Secondary | ICD-10-CM | POA: Diagnosis not present

## 2016-05-02 DIAGNOSIS — L608 Other nail disorders: Secondary | ICD-10-CM | POA: Diagnosis not present

## 2016-05-02 DIAGNOSIS — L600 Ingrowing nail: Secondary | ICD-10-CM | POA: Diagnosis not present

## 2016-05-02 NOTE — Patient Instructions (Signed)

## 2016-05-08 DIAGNOSIS — L600 Ingrowing nail: Secondary | ICD-10-CM | POA: Insufficient documentation

## 2016-05-08 NOTE — Progress Notes (Signed)
Subjective: 82 year old female presents the office today for concerns of a painful ingrown tone to the right big toe on the medial aspect. She states that she has had some discoloration to the outside part of the right big toenail. She states that this started after she rubs her shoe to the area. She denies any other recent injury or trauma. She states the ingrown toenails painful pressure in shoe gear but she has no pain on the toenail Withers discolored. Denies any systemic complaints such as fevers, chills, nausea, vomiting. No acute changes since last appointment, and no other complaints at this time.   Objective: AAO x3, NAD DP/PT pulses palpable bilaterally, CRT less than 3 seconds There is evidence of incurvation on medial aspect of the right hallux toenail tenderness palpation. Localized edema to this area any erythema or drainage or pus. There is darkened discoloration along the lateral aspect of the hallux toenail. Upon debridement I was able to remove old dried blood. There is no extension of hyperpigmentation into the skin are to their surrounding skin were nail bed. There is no tenderness along the area of the hyperpigmentation. There is no signs of infection. No open lesions or pre-ulcerative lesions.  No pain with calf compression, swelling, warmth, erythema  Assessment: Subungual hematoma right hallux, and the toenail right hallux   Plan: -All treatment options discussed with the patient including all alternatives, risks, complications.  -Lateral portion of the right hallux toenail was debrided this was sent for biopsy to any other past allergy. [facility] -At this time, the patient is requesting partial nail removal with chemical matricectomy to the symptomatic portion of the nail. Risks and complications were discussed with the patient for which they understand and  verbally consent to the procedure. Under sterile conditions a total of 3 mL of a mixture of 2% lidocaine  plain and 0.5% Marcaine plain was infiltrated in a hallux block fashion. Once anesthetized, the skin was prepped in sterile fashion. A tourniquet was then applied. Next the medial aspect of hallux nail border was then sharply excised making sure to remove the entire offending nail border. Once the nails were ensured to be removed area was debrided and the underlying skin was intact. There is no purulence identified in the procedure. Next phenol was then applied under standard conditions and copiously irrigated. Silvadene was applied. A dry sterile dressing was applied. After application of the dressing the tourniquet was removed and there is found to be an immediate capillary refill time to the digit. The patient tolerated the procedure well any complications. Post procedure instructions were discussed the patient for which he verbally understood. Follow-up in one week for nail check or sooner if any problems are to arise. Discussed signs/symptoms of infection and directed to call the office immediately should any occur or go directly to the emergency room. In the meantime, encouraged to call the office with any questions, concerns, changes symptoms. -Patient encouraged to call the office with any questions, concerns, change in symptoms.   Ovid CurdMatthew Ron Junco, DPM

## 2016-05-09 ENCOUNTER — Ambulatory Visit: Payer: Medicare Other | Admitting: Podiatry

## 2016-05-09 ENCOUNTER — Ambulatory Visit (INDEPENDENT_AMBULATORY_CARE_PROVIDER_SITE_OTHER): Payer: Medicare Other | Admitting: Podiatry

## 2016-05-09 DIAGNOSIS — Z9889 Other specified postprocedural states: Secondary | ICD-10-CM

## 2016-05-09 NOTE — Progress Notes (Signed)
Subjective: Rhonda Watkins is a 82 y.o.  female returns to office today for follow up evaluation after having right Hallux partial nail avulsion performed. Patient has been soaking using epsom salts for the first 2 days and applying topical antibiotic covered with bandaid daily. Denies any pain or drainage or any swelling. Patient denies fevers, chills, nausea, vomiting. Denies any calf pain, chest pain, SOB.   Objective:  Vitals: Reviewed  General: Well developed, nourished, in no acute distress, alert and oriented x3   Dermatology: Skin is warm, dry and supple bilateral. Right hallux nail border appears to be clean, dry, with mild granular tissue and surrounding scab. There is no surrounding erythema, edema, drainage/purulence. The remaining nails appear unremarkable at this time. There are no other lesions or other signs of infection present. Continuation of some hyperpigmentation within the lateral aspect of the right hallux toenail consistent with a subungual hematoma. No extension of the skin.  Neurovascular status: Intact. No lower extremity swelling; No pain with calf compression bilateral.  Musculoskeletal: Decreased tenderness to palpation of the medial hallux nail fold. Muscular strength within normal limits bilateral.   Assesement and Plan: S/p partial nail avulsion, doing well.   -Continue soaking in epsom salts twice a day followed by antibiotic ointment and a band-aid. Can leave uncovered at night. Continue this until completely healed.  -If the area has not healed in 2 weeks, call the office for follow-up appointment, or sooner if any problems arise.  -Monitor for any signs/symptoms of infection. Call the office immediately if any occur or go directly to the emergency room. Call with any questions/concerns. -Await biopsy/culture results.   Ovid CurdMatthew Jyll Tomaro, DPM

## 2016-05-12 ENCOUNTER — Ambulatory Visit: Payer: Medicare Other | Admitting: Podiatry

## 2016-05-13 ENCOUNTER — Telehealth: Payer: Self-pay | Admitting: *Deleted

## 2016-05-13 NOTE — Telephone Encounter (Signed)
Dr. Ardelle AntonWagoner reviewed 05/02/2016 fungal culture as negative the specimen was blood under the nail. I informed pt.

## 2016-06-04 DIAGNOSIS — E7800 Pure hypercholesterolemia, unspecified: Secondary | ICD-10-CM | POA: Diagnosis not present

## 2016-06-04 DIAGNOSIS — E119 Type 2 diabetes mellitus without complications: Secondary | ICD-10-CM | POA: Diagnosis not present

## 2016-06-04 DIAGNOSIS — Z7984 Long term (current) use of oral hypoglycemic drugs: Secondary | ICD-10-CM | POA: Diagnosis not present

## 2016-06-04 DIAGNOSIS — Z79899 Other long term (current) drug therapy: Secondary | ICD-10-CM | POA: Diagnosis not present

## 2016-06-04 DIAGNOSIS — I10 Essential (primary) hypertension: Secondary | ICD-10-CM | POA: Diagnosis not present

## 2016-06-04 DIAGNOSIS — Z1389 Encounter for screening for other disorder: Secondary | ICD-10-CM | POA: Diagnosis not present

## 2016-06-04 DIAGNOSIS — J4530 Mild persistent asthma, uncomplicated: Secondary | ICD-10-CM | POA: Diagnosis not present

## 2016-06-04 DIAGNOSIS — K219 Gastro-esophageal reflux disease without esophagitis: Secondary | ICD-10-CM | POA: Diagnosis not present

## 2016-06-04 DIAGNOSIS — Z0000 Encounter for general adult medical examination without abnormal findings: Secondary | ICD-10-CM | POA: Diagnosis not present

## 2016-07-07 DIAGNOSIS — M1711 Unilateral primary osteoarthritis, right knee: Secondary | ICD-10-CM | POA: Diagnosis not present

## 2016-07-14 DIAGNOSIS — Z79899 Other long term (current) drug therapy: Secondary | ICD-10-CM | POA: Diagnosis not present

## 2016-09-01 ENCOUNTER — Other Ambulatory Visit: Payer: Self-pay | Admitting: *Deleted

## 2016-09-01 MED ORDER — RANITIDINE HCL 300 MG PO CAPS: 300.0000 mg | ORAL_CAPSULE | Freq: Every evening | ORAL | 0 refills | Status: DC

## 2016-09-09 ENCOUNTER — Encounter: Payer: Self-pay | Admitting: Allergy and Immunology

## 2016-09-09 ENCOUNTER — Ambulatory Visit (INDEPENDENT_AMBULATORY_CARE_PROVIDER_SITE_OTHER): Payer: Medicare Other | Admitting: Allergy and Immunology

## 2016-09-09 ENCOUNTER — Encounter (INDEPENDENT_AMBULATORY_CARE_PROVIDER_SITE_OTHER): Payer: Self-pay

## 2016-09-09 VITALS — BP 148/70 | HR 68 | Resp 16

## 2016-09-09 DIAGNOSIS — K219 Gastro-esophageal reflux disease without esophagitis: Secondary | ICD-10-CM | POA: Diagnosis not present

## 2016-09-09 DIAGNOSIS — J4530 Mild persistent asthma, uncomplicated: Secondary | ICD-10-CM | POA: Diagnosis not present

## 2016-09-09 DIAGNOSIS — J3089 Other allergic rhinitis: Secondary | ICD-10-CM

## 2016-09-09 MED ORDER — OMEPRAZOLE 20 MG PO CPDR: 20.0000 mg | DELAYED_RELEASE_CAPSULE | Freq: Every day | ORAL | 5 refills | Status: DC

## 2016-09-09 MED ORDER — RANITIDINE HCL 300 MG PO CAPS: 300.0000 mg | ORAL_CAPSULE | Freq: Every evening | ORAL | 5 refills | Status: DC

## 2016-09-09 MED ORDER — MOMETASONE FUROATE 220 MCG/INH IN AEPB: INHALATION_SPRAY | RESPIRATORY_TRACT | 3 refills | Status: DC

## 2016-09-09 NOTE — Progress Notes (Signed)
Follow-up Note  Referring Provider: Merlene Laughter, MD Primary Provider: Ginette Otto, MD Date of Office Visit: 09/09/2016  Subjective:   Rhonda Watkins (DOB: 05/08/1932) is a 83 y.o. female [facility] in re-evaluation of the following:  HPI: Rhonda Watkins returns to this clinic in reevaluation of her asthma and allergic rhinoconjunctivitis and LPR. I've not seen her in his clinic since June 2017.  She has really done very well with her respiratory tract during the interval. She has not required a systemic steroid or an antibiotic for an exacerbation. She rarely uses a short acting bronchodilator and she can exercise although she does not do so to any large extent. She has not been having any issues with her reflux or her throat.  She did obtain the flu vaccine this year.   Apparently she has elevation of liver function tests which are being evaluated by Dr. Pete Glatter.  Allergies as of 09/09/2016      Reactions   Amlodipine    Avelox [moxifloxacin Hcl In Nacl]    Crestor [rosuvastatin]    Dynacirc [isradipine]    Enablex [darifenacin Hydrobromide Er]    Latex    Lescol [fluvastatin Sodium]    Lipitor [atorvastatin]    Lovastatin    Mobic [meloxicam]    Norvasc [amlodipine Besylate]    Novocain [procaine]    Penicillins    Pravachol [pravastatin Sodium]    Questran [cholestyramine]    Toprol Xl [metoprolol Tartrate]    Ultracet [tramadol-acetaminophen]    Welchol [colesevelam Hcl]    Zetia [ezetimibe]    Zocor [simvastatin]       Medication List      aspirin 81 MG tablet Take 81 mg by mouth daily.   atorvastatin 10 MG tablet Commonly known as:  LIPITOR Take 10 mg by mouth daily.   Biotin 2.5 MG Caps Take 2.5 mg by mouth daily.   diltiazem 180 MG 24 hr capsule Commonly known as:  DILACOR XR Take 180 mg by mouth daily.   furosemide 20 MG tablet Commonly known as:  LASIX Take 20 mg by mouth daily.     furosemide 40 MG tablet Commonly known as:  LASIX Take 40 mg by mouth every other day.   MICRO-K 8 MEQ Cpcr capsule CR Generic drug:  Potassium Chloride CR Take 8 mEq by mouth daily.   mometasone 220 MCG/INH inhaler Commonly known as:  ASMANEX 120 METERED DOSES Inhale one puff once daily to prevent cough or wheeze   MULTI COMPLETE PO Take 1 tablet by mouth daily.   omeprazole 20 MG capsule Commonly known as:  PRILOSEC Take 20 mg by mouth daily.   PROAIR HFA 108 (90 Base) MCG/ACT inhaler Generic drug:  albuterol Inhale 2 puffs into the lungs every 6 (six) hours as needed for wheezing or shortness of breath.   ranitidine 300 MG capsule Commonly known as:  ZANTAC Take 1 capsule (300 mg total) by mouth every evening.   telmisartan 80 MG tablet Commonly known as:  MICARDIS Take 80 mg by mouth daily.   Vitamin B 12 100 MCG Lozg Take 100 mcg by mouth daily.   vitamin C 1000 MG tablet Take 1,000 mg by mouth daily.   VITAMIN D PO Take 3,000 Units by mouth daily.       Past Medical History:  Diagnosis Date  . Arthritis   . Asthma   . GERD (gastroesophageal reflux disease)   . Hyperlipidemia   .  Hypertension   . Mitral regurgitation   . Shortness of breath dyspnea    occ  . UTI (lower urinary tract infection)   . Vitamin D deficiency     Past Surgical History:  Procedure Laterality Date  . BACK SURGERY  85,04  . BLADDER REPAIR  95,05  . BUNIONECTOMY Left 15  . cholecyctectomy    . CHOLECYSTECTOMY  80  . COLON SURGERY  05  . EXPLORATION MIDDLE EAR Left    vertigo- no hearing now- 83 yrs old  . HERNIA REPAIR  06,08,11  . KNEE ARTHROSCOPY     lft 02 rt 09    Review of systems negative except as noted in HPI / PMHx or noted below:  Review of Systems  Constitutional: Negative.   HENT: Negative.   Eyes: Negative.   Respiratory: Negative.   Cardiovascular: Negative.   Gastrointestinal: Negative.   Genitourinary: Negative.   Musculoskeletal: Negative.    Skin: Negative.   Neurological: Negative.   Endo/Heme/Allergies: Negative.   Psychiatric/Behavioral: Negative.      Objective:   Vitals:   09/09/16 1116  BP: (!) 148/70  Pulse: 68  Resp: 16          Physical Exam  Constitutional: She is well-developed, well-nourished, and in no distress.  HENT:  Head: Normocephalic.  Right Ear: Tympanic membrane, external ear and ear canal normal.  Left Ear: Tympanic membrane, external ear and ear canal normal.  Nose: Nose normal. No mucosal edema or rhinorrhea.  Mouth/Throat: Uvula is midline, oropharynx is clear and moist and mucous membranes are normal. No oropharyngeal exudate.  Eyes: Conjunctivae are normal.  Neck: Trachea normal. No tracheal tenderness present. No tracheal deviation present. No thyromegaly present.  Cardiovascular: Normal rate, regular rhythm, S1 normal, S2 normal and normal heart sounds.   No murmur heard. Pulmonary/Chest: Breath sounds normal. No stridor. No respiratory distress. She has no wheezes. She has no rales.  Lymphadenopathy:       Head (right side): No tonsillar adenopathy present.       Head (left side): No tonsillar adenopathy present.    She has no cervical adenopathy.  Neurological: She is alert. Gait normal.  Skin: No rash noted. She is not diaphoretic. No erythema. Nails show no clubbing.  Psychiatric: Mood and affect normal.    Diagnostics:    Spirometry was performed and demonstrated an FEV1 of 1.72 at 108 % of predicted.  Assessment and Plan:   1. Asthma, well controlled, mild persistent   2. LPRD (laryngopharyngeal reflux disease)   3. Other allergic rhinitis     1. Continue Asmanex 220 one inhalation 1 time per day  2. Continue Prilosec 20 mg in a.m. plus ranitidine 300 mg in PM  3. Continue ProAir HFA 2 puffs every 4-6 hours if needed  4. Return to clinic in 6 months or earlier if problem  Elyna appears to be doing quite well on her current plan and we'll continue to have her  use anti-inflammatory medications for her respiratory tract and therapy directed against LPR and see her back in this clinic in 6 months or earlier if there is a problem.  Laurette SchimkeEric Kozlow, MD [facility]

## 2016-09-09 NOTE — Patient Instructions (Signed)
  1. Continue Asmanex 220 one inhalation 1 time per day  2. Continue Prilosec 20 mg in a.m. plus ranitidine 300 mg in PM  3. Continue ProAir HFA 2 puffs every 4-6 hours if needed  4. Return to clinic in 6 months or earlier if problem

## 2016-09-10 ENCOUNTER — Encounter: Payer: Self-pay | Admitting: Allergy and Immunology

## 2016-09-15 ENCOUNTER — Other Ambulatory Visit: Payer: Self-pay | Admitting: *Deleted

## 2016-09-15 MED ORDER — ALBUTEROL SULFATE HFA 108 (90 BASE) MCG/ACT IN AERS: 2.0000 | INHALATION_SPRAY | Freq: Four times a day (QID) | RESPIRATORY_TRACT | 1 refills | Status: DC | PRN

## 2016-10-13 DIAGNOSIS — M5137 Other intervertebral disc degeneration, lumbosacral region: Secondary | ICD-10-CM | POA: Diagnosis not present

## 2016-10-13 DIAGNOSIS — M4807 Spinal stenosis, lumbosacral region: Secondary | ICD-10-CM | POA: Diagnosis not present

## 2016-10-13 DIAGNOSIS — Z6830 Body mass index (BMI) 30.0-30.9, adult: Secondary | ICD-10-CM | POA: Diagnosis not present

## 2016-10-13 DIAGNOSIS — I10 Essential (primary) hypertension: Secondary | ICD-10-CM | POA: Diagnosis not present

## 2016-10-20 ENCOUNTER — Other Ambulatory Visit: Payer: Self-pay | Admitting: Neurosurgery

## 2016-10-20 DIAGNOSIS — M4807 Spinal stenosis, lumbosacral region: Secondary | ICD-10-CM

## 2016-10-22 ENCOUNTER — Ambulatory Visit
Admission: RE | Admit: 2016-10-22 | Discharge: 2016-10-22 | Disposition: A | Discharge status: Home / Self Care | Payer: Medicare Other | Source: Ambulatory Visit | Attending: Neurosurgery | Admitting: Neurosurgery

## 2016-10-22 DIAGNOSIS — M545 Low back pain: Secondary | ICD-10-CM | POA: Diagnosis not present

## 2016-10-22 DIAGNOSIS — M4807 Spinal stenosis, lumbosacral region: Secondary | ICD-10-CM

## 2016-10-23 DIAGNOSIS — M5126 Other intervertebral disc displacement, lumbar region: Secondary | ICD-10-CM | POA: Diagnosis not present

## 2016-10-23 DIAGNOSIS — M5137 Other intervertebral disc degeneration, lumbosacral region: Secondary | ICD-10-CM | POA: Diagnosis not present

## 2016-10-23 DIAGNOSIS — M5416 Radiculopathy, lumbar region: Secondary | ICD-10-CM | POA: Diagnosis not present

## 2016-11-05 DIAGNOSIS — M545 Low back pain: Secondary | ICD-10-CM | POA: Diagnosis not present

## 2016-11-05 DIAGNOSIS — M5137 Other intervertebral disc degeneration, lumbosacral region: Secondary | ICD-10-CM | POA: Diagnosis not present

## 2016-11-12 DIAGNOSIS — M5416 Radiculopathy, lumbar region: Secondary | ICD-10-CM | POA: Diagnosis not present

## 2016-11-24 DIAGNOSIS — M5137 Other intervertebral disc degeneration, lumbosacral region: Secondary | ICD-10-CM | POA: Diagnosis not present

## 2016-11-27 DIAGNOSIS — K5901 Slow transit constipation: Secondary | ICD-10-CM | POA: Diagnosis not present

## 2016-11-27 DIAGNOSIS — K5730 Diverticulosis of large intestine without perforation or abscess without bleeding: Secondary | ICD-10-CM | POA: Diagnosis not present

## 2016-12-03 DIAGNOSIS — E119 Type 2 diabetes mellitus without complications: Secondary | ICD-10-CM | POA: Diagnosis not present

## 2016-12-03 DIAGNOSIS — I10 Essential (primary) hypertension: Secondary | ICD-10-CM | POA: Diagnosis not present

## 2016-12-03 DIAGNOSIS — R269 Unspecified abnormalities of gait and mobility: Secondary | ICD-10-CM | POA: Diagnosis not present

## 2016-12-03 DIAGNOSIS — Z79899 Other long term (current) drug therapy: Secondary | ICD-10-CM | POA: Diagnosis not present

## 2017-01-06 DIAGNOSIS — H52223 Regular astigmatism, bilateral: Secondary | ICD-10-CM | POA: Diagnosis not present

## 2017-02-23 DIAGNOSIS — M5137 Other intervertebral disc degeneration, lumbosacral region: Secondary | ICD-10-CM | POA: Diagnosis not present

## 2017-03-10 ENCOUNTER — Encounter: Payer: Self-pay | Admitting: Allergy and Immunology

## 2017-03-10 ENCOUNTER — Ambulatory Visit (INDEPENDENT_AMBULATORY_CARE_PROVIDER_SITE_OTHER): Payer: Medicare Other | Admitting: Allergy and Immunology

## 2017-03-10 VITALS — BP 122/70 | HR 68 | Resp 19 | Ht 61.5 in | Wt 162.2 lb

## 2017-03-10 DIAGNOSIS — J4530 Mild persistent asthma, uncomplicated: Secondary | ICD-10-CM

## 2017-03-10 DIAGNOSIS — K219 Gastro-esophageal reflux disease without esophagitis: Secondary | ICD-10-CM

## 2017-03-10 DIAGNOSIS — J3089 Other allergic rhinitis: Secondary | ICD-10-CM

## 2017-03-10 MED ORDER — ALBUTEROL SULFATE HFA 108 (90 BASE) MCG/ACT IN AERS: INHALATION_SPRAY | RESPIRATORY_TRACT | 1 refills | Status: DC

## 2017-03-10 NOTE — Progress Notes (Signed)
Follow-up Note  Referring Provider: Merlene LaughterStoneking, Hal, MD Primary Provider: Merlene LaughterStoneking, Hal, MD Date of Office Visit: 03/10/2017  Subjective:   Rhonda Watkins (DOB: Apr 13, 1932) is a 83 y.o. female [facility] in re-evaluation of the following:  HPI: Rhonda Watkins returns to this clinic in reevaluation of her asthma and allergic rhinoconjunctivitis and LPR. Her last visit to this clinic was January 2018.  During the interval she has really done very well and has not required a systemic steroid or an antibiotic to treat any Respiratory tract issue and rarely uses a short acting bronchodilator. She cannot really exercise to any significant degree because of musculoskeletal issues involving her back and knees. She has had no issues with her nose or throat or reflux.  In May 2018 she did develop a little more shortness of breath and increased her Asmanex to twice a day which has resulted in resolution of this issue and she is doing quite well at this point.  Allergies as of 03/10/2017      Reactions   Amlodipine    Avelox [moxifloxacin Hcl In Nacl]    Crestor [rosuvastatin]    Dynacirc [isradipine]    Enablex [darifenacin Hydrobromide Er]    Latex    Lescol [fluvastatin Sodium]    Lipitor [atorvastatin]    Lovastatin    Mobic [meloxicam]    Norvasc [amlodipine Besylate]    Novocain [procaine]    Penicillins    Pravachol [pravastatin Sodium]    Questran [cholestyramine]    Toprol Xl [metoprolol Tartrate]    Ultracet [tramadol-acetaminophen]    Welchol [colesevelam Hcl]    Zetia [ezetimibe]    Zocor [simvastatin]       Medication List      albuterol 108 (90 Base) MCG/ACT inhaler Commonly known as:  PROAIR HFA Inhale two puffs every four to six hours as needed for cough or wheeze.   aspirin 81 MG tablet Take 81 mg by mouth daily.   atorvastatin 10 MG tablet Commonly known as:  LIPITOR Take 10 mg by mouth daily.   Biotin 2.5 MG  Caps Take 2.5 mg by mouth daily.   diltiazem 180 MG 24 hr capsule Commonly known as:  DILACOR XR Take 180 mg by mouth daily.   furosemide 20 MG tablet Commonly known as:  LASIX Take 20 mg by mouth daily.   furosemide 40 MG tablet Commonly known as:  LASIX Take 40 mg by mouth every other day.   MICRO-K 8 MEQ Cpcr capsule CR Generic drug:  Potassium Chloride CR Take 8 mEq by mouth daily.   mometasone 220 MCG/INH inhaler Commonly known as:  ASMANEX 120 METERED DOSES Inhale one puff once daily to prevent cough or wheeze   MULTI COMPLETE PO Take 1 tablet by mouth daily.   omeprazole 20 MG capsule Commonly known as:  PRILOSEC Take 1 capsule (20 mg total) by mouth daily.   telmisartan 80 MG tablet Commonly known as:  MICARDIS Take 80 mg by mouth daily.   Vitamin B 12 100 MCG Lozg Take 100 mcg by mouth daily.   vitamin C 1000 MG tablet Take 1,000 mg by mouth daily.   VITAMIN D PO Take 3,000 Units by mouth daily.       Past Medical History:  Diagnosis Date  . Arthritis   . Asthma   . GERD (gastroesophageal reflux disease)   . Hyperlipidemia   . Hypertension   . Mitral regurgitation   .  Shortness of breath dyspnea    occ  . UTI (lower urinary tract infection)   . Vitamin D deficiency     Past Surgical History:  Procedure Laterality Date  . BACK SURGERY  85,04  . BLADDER REPAIR  95,05  . BUNIONECTOMY Left 15  . cholecyctectomy    . CHOLECYSTECTOMY  80  . COLON SURGERY  05  . EXPLORATION MIDDLE EAR Left    vertigo- no hearing now- 83 yrs old  . HERNIA REPAIR  06,08,11  . KNEE ARTHROSCOPY     lft 02 rt 09    Review of systems negative except as noted in HPI / PMHx or noted below:  Review of Systems  Constitutional: Negative.   HENT: Negative.   Eyes: Negative.   Respiratory: Negative.   Cardiovascular: Negative.   Gastrointestinal: Negative.   Genitourinary: Negative.   Musculoskeletal: Negative.   Skin: Negative.   Neurological: Negative.     Endo/Heme/Allergies: Negative.   Psychiatric/Behavioral: Negative.      Objective:   Vitals:   03/10/17 1054  BP: 122/70  Pulse: 68  Resp: 19   Height: 5' 1.5" (156.2 cm)  Weight: 162 lb 3.2 oz (73.6 kg)   Physical Exam  Constitutional: She is well-developed, well-nourished, and in no distress.  HENT:  Head: Normocephalic.  Right Ear: Tympanic membrane, external ear and ear canal normal.  Left Ear: Tympanic membrane, external ear and ear canal normal.  Nose: Nose normal. No mucosal edema or rhinorrhea.  Mouth/Throat: Uvula is midline, oropharynx is clear and moist and mucous membranes are normal. No oropharyngeal exudate.  Eyes: Conjunctivae are normal.  Neck: Trachea normal. No tracheal tenderness present. No tracheal deviation present. No thyromegaly present.  Cardiovascular: Normal rate, regular rhythm, S1 normal, S2 normal and normal heart sounds.   No murmur heard. Pulmonary/Chest: Breath sounds normal. No stridor. No respiratory distress. She has no wheezes. She has no rales.  Lymphadenopathy:       Head (right side): No tonsillar adenopathy present.       Head (left side): No tonsillar adenopathy present.    She has no cervical adenopathy.  Neurological: She is alert. Gait normal.  Skin: No rash noted. She is not diaphoretic. No erythema. Nails show no clubbing.  Psychiatric: Mood and affect normal.    Diagnostics:    Spirometry was performed and demonstrated an FEV1 of 2.03 at 128 % of predicted.  The patient had an Asthma Control Test with the following results: ACT Total Score: 21.    Assessment and Plan:   1. Asthma, well controlled, mild persistent   2. Other allergic rhinitis   3. LPRD (laryngopharyngeal reflux disease)     1. Continue Asmanex 220 one inhalation 1-2 time per day  2. Continue Prilosec 20 mg in a.m. plus ranitidine 300 mg in PM  3. Continue ProAir HFA 2 puffs every 4-6 hours if needed  4. Return to clinic in 6 months or earlier if  problem  5. Obtain fall flu vaccine  Rhonda Watkins appears to be doing very well on her current plan and I will continue to have her use anti-inflammatory agents for her lungs and therapy directed against reflux and she can return to see me in 6 months or earlier if there is a problem.  Laurette SchimkeEric Kozlow, MD Allergy / Immunology [facility]

## 2017-03-10 NOTE — Patient Instructions (Signed)
  1. Continue Asmanex 220 one inhalation 1-2 time per day  2. Continue Prilosec 20 mg in a.m. plus ranitidine 300 mg in PM  3. Continue ProAir HFA 2 puffs every 4-6 hours if needed  4. Return to clinic in 6 months or earlier if problem  5. Obtain fall flu vaccine 

## 2017-03-18 DIAGNOSIS — H9313 Tinnitus, bilateral: Secondary | ICD-10-CM | POA: Diagnosis not present

## 2017-03-18 DIAGNOSIS — H6123 Impacted cerumen, bilateral: Secondary | ICD-10-CM | POA: Diagnosis not present

## 2017-03-18 DIAGNOSIS — H903 Sensorineural hearing loss, bilateral: Secondary | ICD-10-CM | POA: Diagnosis not present

## 2017-03-18 DIAGNOSIS — J310 Chronic rhinitis: Secondary | ICD-10-CM | POA: Diagnosis not present

## 2017-03-26 ENCOUNTER — Other Ambulatory Visit: Payer: Self-pay | Admitting: *Deleted

## 2017-03-26 MED ORDER — RANITIDINE HCL 300 MG PO TABS: 300.0000 mg | ORAL_TABLET | Freq: Every day | ORAL | 5 refills | Status: DC

## 2017-04-20 ENCOUNTER — Other Ambulatory Visit: Payer: Self-pay | Admitting: *Deleted

## 2017-04-20 MED ORDER — MOMETASONE FUROATE 220 MCG/INH IN AEPB: INHALATION_SPRAY | RESPIRATORY_TRACT | 3 refills | Status: DC

## 2017-04-21 DIAGNOSIS — Z1231 Encounter for screening mammogram for malignant neoplasm of breast: Secondary | ICD-10-CM | POA: Diagnosis not present

## 2017-05-13 DIAGNOSIS — L720 Epidermal cyst: Secondary | ICD-10-CM | POA: Diagnosis not present

## 2017-05-13 DIAGNOSIS — L603 Nail dystrophy: Secondary | ICD-10-CM | POA: Diagnosis not present

## 2017-05-13 DIAGNOSIS — D1801 Hemangioma of skin and subcutaneous tissue: Secondary | ICD-10-CM | POA: Diagnosis not present

## 2017-05-13 DIAGNOSIS — I788 Other diseases of capillaries: Secondary | ICD-10-CM | POA: Diagnosis not present

## 2017-05-14 DIAGNOSIS — Z23 Encounter for immunization: Secondary | ICD-10-CM | POA: Diagnosis not present

## 2017-05-26 DIAGNOSIS — M5137 Other intervertebral disc degeneration, lumbosacral region: Secondary | ICD-10-CM | POA: Diagnosis not present

## 2017-06-26 DIAGNOSIS — M65341 Trigger finger, right ring finger: Secondary | ICD-10-CM | POA: Insufficient documentation

## 2017-06-26 DIAGNOSIS — M79642 Pain in left hand: Secondary | ICD-10-CM | POA: Diagnosis not present

## 2017-06-26 DIAGNOSIS — M79641 Pain in right hand: Secondary | ICD-10-CM | POA: Diagnosis not present

## 2017-06-26 DIAGNOSIS — M19042 Primary osteoarthritis, left hand: Secondary | ICD-10-CM

## 2017-06-26 DIAGNOSIS — M67441 Ganglion, right hand: Secondary | ICD-10-CM | POA: Insufficient documentation

## 2017-06-26 DIAGNOSIS — M19041 Primary osteoarthritis, right hand: Secondary | ICD-10-CM | POA: Insufficient documentation

## 2017-06-26 DIAGNOSIS — M65331 Trigger finger, right middle finger: Secondary | ICD-10-CM | POA: Diagnosis not present

## 2017-07-27 DIAGNOSIS — M65331 Trigger finger, right middle finger: Secondary | ICD-10-CM | POA: Diagnosis not present

## 2017-07-27 DIAGNOSIS — M65341 Trigger finger, right ring finger: Secondary | ICD-10-CM | POA: Diagnosis not present

## 2017-08-12 DIAGNOSIS — Z6829 Body mass index (BMI) 29.0-29.9, adult: Secondary | ICD-10-CM | POA: Diagnosis not present

## 2017-08-12 DIAGNOSIS — M4807 Spinal stenosis, lumbosacral region: Secondary | ICD-10-CM | POA: Diagnosis not present

## 2017-08-12 DIAGNOSIS — I10 Essential (primary) hypertension: Secondary | ICD-10-CM | POA: Diagnosis not present

## 2017-08-20 DIAGNOSIS — N390 Urinary tract infection, site not specified: Secondary | ICD-10-CM | POA: Diagnosis not present

## 2017-08-26 DIAGNOSIS — I10 Essential (primary) hypertension: Secondary | ICD-10-CM | POA: Diagnosis not present

## 2017-08-26 DIAGNOSIS — E119 Type 2 diabetes mellitus without complications: Secondary | ICD-10-CM | POA: Diagnosis not present

## 2017-08-26 DIAGNOSIS — K219 Gastro-esophageal reflux disease without esophagitis: Secondary | ICD-10-CM | POA: Diagnosis not present

## 2017-08-26 DIAGNOSIS — Z0000 Encounter for general adult medical examination without abnormal findings: Secondary | ICD-10-CM | POA: Diagnosis not present

## 2017-09-02 DIAGNOSIS — M8588 Other specified disorders of bone density and structure, other site: Secondary | ICD-10-CM | POA: Diagnosis not present

## 2017-09-08 DIAGNOSIS — M1711 Unilateral primary osteoarthritis, right knee: Secondary | ICD-10-CM | POA: Diagnosis not present

## 2017-09-21 DIAGNOSIS — M85851 Other specified disorders of bone density and structure, right thigh: Secondary | ICD-10-CM | POA: Diagnosis not present

## 2017-09-21 DIAGNOSIS — M85852 Other specified disorders of bone density and structure, left thigh: Secondary | ICD-10-CM | POA: Diagnosis not present

## 2017-09-29 ENCOUNTER — Ambulatory Visit: Payer: Medicare Other | Admitting: Allergy and Immunology

## 2017-09-29 ENCOUNTER — Encounter: Payer: Self-pay | Admitting: Allergy and Immunology

## 2017-09-29 VITALS — BP 120/72 | HR 68 | Resp 16

## 2017-09-29 DIAGNOSIS — J4530 Mild persistent asthma, uncomplicated: Secondary | ICD-10-CM

## 2017-09-29 DIAGNOSIS — K219 Gastro-esophageal reflux disease without esophagitis: Secondary | ICD-10-CM | POA: Diagnosis not present

## 2017-09-29 DIAGNOSIS — J3089 Other allergic rhinitis: Secondary | ICD-10-CM | POA: Diagnosis not present

## 2017-09-29 NOTE — Progress Notes (Signed)
Follow-up Note  Referring Provider: Merlene Laughter, MD Primary Provider: Merlene Laughter, MD Date of Office Visit: 09/29/2017  Subjective:   Rhonda Watkins (DOB: January 09, 1932) is a 84 y.o. female who [facility] in re-evaluation of the following:  HPI: Rhonda Watkins presents to this clinic in evaluation of asthma and allergic rhinoconjunctivitis and LPR.  Her last visit to this clinic was 10 March 2017.  Overall she has had an excellent interval.  She has not required a systemic steroid or an antibiotic to treat a respiratory tract issue.  She rarely uses a short acting bronchodilator.  She does not exercise secondary to a musculoskeletal issue especially involving her right lower extremity.  She continues to use Asmanex mostly 1 time per day.  She believes that her reflux is under very good control with ranitidine at nighttime.  She will add in a Prilosec if she has more than 1 cup of coffee per day as that does result in some reflux.  She did obtain the flu vaccine this year.  Since November she has been dealing with the death of her 90 year old grandson secondary to drug overdose.  Allergies as of 09/29/2017      Reactions   Amlodipine    Avelox [moxifloxacin Hcl In Nacl]    Crestor [rosuvastatin]    Dynacirc [isradipine]    Enablex [darifenacin Hydrobromide Er]    Latex    Lescol [fluvastatin Sodium]    Lipitor [atorvastatin]    Lovastatin    Mobic [meloxicam]    Norvasc [amlodipine Besylate]    Novocain [procaine]    Penicillins    Pravachol [pravastatin Sodium]    Questran [cholestyramine]    Toprol Xl [metoprolol Tartrate]    Ultracet [tramadol-acetaminophen]    Welchol [colesevelam Hcl]    Zetia [ezetimibe]    Zocor [simvastatin]       Medication List      albuterol 108 (90 Base) MCG/ACT inhaler Commonly known as:  PROAIR HFA Inhale two puffs every four to six hours as needed for cough or wheeze.   aspirin 81 MG  tablet Take 81 mg by mouth daily.   diltiazem 180 MG 24 hr capsule Commonly known as:  DILACOR XR Take 180 mg by mouth daily.   furosemide 20 MG tablet Commonly known as:  LASIX Take 20 mg by mouth daily.   furosemide 40 MG tablet Commonly known as:  LASIX Take 40 mg by mouth every other day.   MICRO-K 8 MEQ Cpcr capsule CR Generic drug:  Potassium Chloride CR Take 8 mEq by mouth daily.   mometasone 220 MCG/INH inhaler Commonly known as:  ASMANEX 120 METERED DOSES Inhale two puffs once daily to prevent cough or wheeze   MULTI COMPLETE PO Take 1 tablet by mouth daily.   omeprazole 20 MG capsule Commonly known as:  PRILOSEC Take 1 capsule (20 mg total) by mouth daily.   ranitidine 300 MG tablet Commonly known as:  ZANTAC Take 1 tablet (300 mg total) by mouth at bedtime.   telmisartan 80 MG tablet Commonly known as:  MICARDIS Take 80 mg by mouth daily.   vitamin C 1000 MG tablet Take 1,000 mg by mouth daily.   VITAMIN D PO Take 3,000 Units by mouth daily.       Past Medical History:  Diagnosis Date  . Arthritis   . Asthma   . GERD (gastroesophageal reflux disease)   . Hyperlipidemia   . Hypertension   .  Mitral regurgitation   . Shortness of breath dyspnea    occ  . UTI (lower urinary tract infection)   . Vitamin D deficiency     Past Surgical History:  Procedure Laterality Date  . BACK SURGERY  85,04  . BLADDER REPAIR  95,05  . BUNIONECTOMY Left 15  . cholecyctectomy    . CHOLECYSTECTOMY  80  . COLON SURGERY  05  . EXPLORATION MIDDLE EAR Left    vertigo- no hearing now- [age] yrs old  . HERNIA REPAIR  06,08,11  . KNEE ARTHROSCOPY     lft 02 rt 09    Review of systems negative except as noted in HPI / PMHx or noted below:  Review of Systems  Constitutional: Negative.   HENT: Negative.   Eyes: Negative.   Respiratory: Negative.   Cardiovascular: Negative.   Gastrointestinal: Negative.   Genitourinary: Negative.   Musculoskeletal: Negative.    Skin: Negative.   Neurological: Negative.   Endo/Heme/Allergies: Negative.   Psychiatric/Behavioral: Negative.      Objective:   Vitals:   09/29/17 1104  BP: 120/72  Pulse: 68  Resp: 16          Physical Exam  Constitutional: She is well-developed, well-nourished, and in no distress.  HENT:  Head: Normocephalic.  Right Ear: External ear and ear canal normal. Tympanic membrane is scarred.  Left Ear: External ear and ear canal normal. Tympanic membrane is scarred.  Nose: Nose normal. No mucosal edema or rhinorrhea.  Mouth/Throat: Uvula is midline, oropharynx is clear and moist and mucous membranes are normal. No oropharyngeal exudate.  Right hearing aid  Eyes: Conjunctivae are normal.  Neck: Trachea normal. No tracheal tenderness present. No tracheal deviation present. No thyromegaly present.  Cardiovascular: Normal rate, regular rhythm, S1 normal and S2 normal.  Murmur (Systolic murmur) heard. Pulmonary/Chest: Breath sounds normal. No stridor. No respiratory distress. She has no wheezes. She has no rales.  Musculoskeletal: She exhibits no edema.  Lymphadenopathy:       Head (right side): No tonsillar adenopathy present.       Head (left side): No tonsillar adenopathy present.    She has no cervical adenopathy.  Neurological: She is alert. Gait normal.  Skin: No rash noted. She is not diaphoretic. No erythema. Nails show no clubbing.  Psychiatric: Mood and affect normal.    Diagnostics:    Spirometry was performed and demonstrated an FEV1 of 1.83 at 112 % of predicted.  The patient had an Asthma Control Test with the following results: ACT Total Score: 24.    Assessment and Plan:   1. Asthma, well controlled, mild persistent   2. Other allergic rhinitis   3. LPRD (laryngopharyngeal reflux disease)     1. Continue Asmanex 220 one inhalation 1-2 time per day  2. Continue Prilosec 20 mg in a.m. plus ranitidine 300 mg in PM  3. Continue ProAir HFA 2 puffs every  4-6 hours if needed  4. Return to clinic in 6 months or earlier if problem  5. Obtain fall flu vaccine  Camry appears to be doing quite well on her current medical therapy directed against respiratory tract inflammation and reflux and she will continue to utilize this plan and I will see her back in this clinic in 6 months or earlier if there is a problem.  Laurette SchimkeEric Denham Mose, MD Allergy / Immunology  [facility]

## 2017-09-29 NOTE — Patient Instructions (Signed)
  1. Continue Asmanex 220 one inhalation 1-2 time per day  2. Continue Prilosec 20 mg in a.m. plus ranitidine 300 mg in PM  3. Continue ProAir HFA 2 puffs every 4-6 hours if needed  4. Return to clinic in 6 months or earlier if problem  5. Obtain fall flu vaccine 

## 2017-09-30 ENCOUNTER — Encounter: Payer: Self-pay | Admitting: Allergy and Immunology

## 2017-10-06 DIAGNOSIS — H1132 Conjunctival hemorrhage, left eye: Secondary | ICD-10-CM | POA: Diagnosis not present

## 2017-10-08 DIAGNOSIS — N390 Urinary tract infection, site not specified: Secondary | ICD-10-CM | POA: Diagnosis not present

## 2017-11-09 ENCOUNTER — Telehealth: Payer: Self-pay

## 2017-11-09 DIAGNOSIS — M1711 Unilateral primary osteoarthritis, right knee: Secondary | ICD-10-CM | POA: Diagnosis not present

## 2017-11-09 NOTE — Telephone Encounter (Signed)
   [facility] Pre-operative Risk Assessment    Request for surgical clearance:  1. What type of surgery is being performed? Right total knee replacement   2. When is this surgery scheduled? pending   3. What type of clearance is required (medical clearance vs. Pharmacy clearance to hold med vs. Both)? medical  4. Are there any medications that need to be held prior to surgery and how long?n/a   5. Practice name and name of physician performing surgery? [facility]   6. What is your office phone and fax number? [phone]   7. Anesthesia type (None, local, MAC, general) ? MAC   Frederik Schmidt 11/09/2017, 1:13 PM  _________________________________________________________________   (provider comments below)

## 2017-11-10 NOTE — Telephone Encounter (Signed)
   Primary Cardiologist:  None   Chart reviewed as part of pre-operative protocol coverage. The patient is not followed in our office.  Therefore, she will need an appointment as a new patient.  Pre-op covering staff: - Please schedule new patient appointment and call patient to inform them. - Please contact requesting surgeon's office via preferred method (i.e, phone, fax) to inform them of need for appointment prior to surgery.  Tereso NewcomerScott Tashauna Caisse, PA-C  11/10/2017, 2:27 PM

## 2017-11-10 NOTE — Telephone Encounter (Signed)
PT IS NEEDING A NEW PT APPT BEFOR CLEARANCE CAN BE GIVEN . I lmom for Delbert HarnessMurphy Wainer surgery scheduler that pt will need a new Pt appt in our office in order to have cardiac clearance. Sent message to our schedulers to call the pt to schedule as a New Pt appt for surgery clearance.

## 2017-11-10 NOTE — Telephone Encounter (Signed)
Per Tereso NewcomerScott Weaver, PA per Pre Op Protocol this is a new pt and will need a New Pt appt before she can be cleared for surgery. I s/w Lorne SkeensGesila Davis Scheduling Supervisor who advised me to [facility] so that the pt may be scheduled with a provider.

## 2017-11-11 NOTE — Telephone Encounter (Signed)
I will route the surgery information to Dr. Elease HashimotoNahser as he will be seeing pt as a New Pt on 11/12/17

## 2017-11-12 ENCOUNTER — Encounter: Payer: Self-pay | Admitting: Cardiovascular Disease

## 2017-11-12 ENCOUNTER — Encounter (INDEPENDENT_AMBULATORY_CARE_PROVIDER_SITE_OTHER): Payer: Self-pay

## 2017-11-12 ENCOUNTER — Ambulatory Visit: Payer: Medicare Other | Admitting: Cardiovascular Disease

## 2017-11-12 VITALS — BP 128/70 | HR 67 | Ht 62.0 in | Wt 160.4 lb

## 2017-11-12 DIAGNOSIS — I10 Essential (primary) hypertension: Secondary | ICD-10-CM | POA: Diagnosis not present

## 2017-11-12 DIAGNOSIS — R002 Palpitations: Secondary | ICD-10-CM | POA: Diagnosis not present

## 2017-11-12 NOTE — Telephone Encounter (Signed)
    I have seen and examined Rhonda Watkins for preoperative evaluation prior to her right knee replacement.  She does not have any active cardiology problems.  She is at low risk for her upcoming knee replacement.  We will see her on an as-needed basis.  Please call for any questions or concerns.    Kristeen MissPhilip Nahser, MD  11/12/2017 11:09 AM    [facility] [address],  [address] [place], [place]  [address] Pager [phone] Phone: [phone]; Fax: [phone]

## 2017-11-12 NOTE — Patient Instructions (Signed)
Medication Instructions:  Your physician recommends that you continue on your current medications as directed. Please refer to the Current Medication list given to you today.   Labwork: None Ordered   Testing/Procedures: None Ordered   Follow-Up: Your physician recommends that you schedule a follow-up appointment in: as needed with Dr. Elease HashimotoNahser for cardiac issues   If you need a refill on your cardiac medications before your next appointment, please call your pharmacy.   Thank you for choosing [facility]! Eligha BridegroomMichelle Thania Woodlief, RN [phone]

## 2017-11-12 NOTE — Progress Notes (Signed)
Cardiology Office Note:    Date:  11/12/2017   ID:  Rhonda Watkins, DOB 07-05-1932, MRN [id number]  PCP:  Merlene Laughter, MD  Cardiologist:  Kristeen Miss, MD   Referring MD: Merlene Laughter, MD   Problem list 1.  Shortness of breath 2.  Mitral regurgitation 3.  Hypertension 4.  Hyperlipidemia  Chief Complaint  Patient presents with  . Shortness of Breath    History of Present Illness:    Rhonda Watkins is a 84 y.o. female with a hx of hypertension, hyperlipidemia, mitral regurgitation.  We are asked today to see her for preoperative evaluation by Dr. Evangeline Dakin for further evaluation of her shortness of breath   She needs a right knee replacement for the past 2 years.   Knee gave way this past week after she did lots of cleaning. Told Dr.  Evangeline Dakin that her chest was shaking and she was referred here. She takes an inhaler for asthma and had not been using her inhaler for the past several days.  Sees Dr. Pete Glatter   Denies any CP or dyspnea.   Has occasional episodes of shortness of breath if she forgets to take her inhaler.  She sees the allergist as well.  She does all of her normal daily activities without any significant problems.  She has never had any episodes of chest pain or shortness of breath.   Past Medical History:  Diagnosis Date  . Arthritis   . Asthma   . GERD (gastroesophageal reflux disease)   . Hyperlipidemia   . Hypertension   . Mitral regurgitation   . Shortness of breath dyspnea    occ  . UTI (lower urinary tract infection)   . Vitamin D deficiency     Past Surgical History:  Procedure Laterality Date  . BACK SURGERY  85,04  . BLADDER REPAIR  95,05  . BUNIONECTOMY Left 15  . cholecyctectomy    . CHOLECYSTECTOMY  80  . COLON SURGERY  05  . EXPLORATION MIDDLE EAR Left    vertigo- no hearing now- 84 yrs old  . HERNIA REPAIR  06,08,11  . KNEE ARTHROSCOPY     lft 02 rt 09    Current Medications: Current Meds  Medication Sig  . albuterol  (PROAIR HFA) 108 (90 Base) MCG/ACT inhaler Inhale two puffs every four to six hours as needed for cough or wheeze.  . Ascorbic Acid (VITAMIN C) 1000 MG tablet Take 1,000 mg by mouth daily.  Marland Kitchen aspirin 81 MG tablet Take 81 mg by mouth daily.  . Cholecalciferol (VITAMIN D PO) Take 3,000 Units by mouth daily.   Marland Kitchen diltiazem (DILACOR XR) 180 MG 24 hr capsule Take 180 mg by mouth daily.  . furosemide (LASIX) 20 MG tablet Take 20 mg by mouth daily.  . furosemide (LASIX) 40 MG tablet Take 40 mg by mouth every other day.  . mometasone (ASMANEX 120 METERED DOSES) 220 MCG/INH inhaler Inhale two puffs once daily to prevent cough or wheeze  . Multiple Vitamins-Minerals (MULTI COMPLETE PO) Take 1 tablet by mouth daily.   Marland Kitchen omeprazole (PRILOSEC) 20 MG capsule Take 1 capsule (20 mg total) by mouth daily.  . Potassium Chloride CR (MICRO-K) 8 MEQ CPCR capsule CR Take 8 mEq by mouth daily.   . ranitidine (ZANTAC) 300 MG tablet Take 1 tablet (300 mg total) by mouth at bedtime.  Marland Kitchen telmisartan (MICARDIS) 80 MG tablet Take 80 mg by mouth daily.     Allergies:   Amlodipine;  Avelox [moxifloxacin hcl in nacl]; Crestor [rosuvastatin]; Dynacirc [isradipine]; Enablex [darifenacin hydrobromide er]; Latex; Lescol [fluvastatin sodium]; Lipitor [atorvastatin]; Lovastatin; Mobic [meloxicam]; Norvasc [amlodipine besylate]; Novocain [procaine]; Penicillins; Pravachol [pravastatin sodium]; Questran [cholestyramine]; Toprol xl [metoprolol tartrate]; Ultracet [tramadol-acetaminophen]; Welchol [colesevelam hcl]; Zetia [ezetimibe]; and Zocor [simvastatin]   Social History   Socioeconomic History  . Marital status: Married    Spouse name: Not on file  . Number of children: Not on file  . Years of education: Not on file  . Highest education level: Not on file  Occupational History  . Not on file  Social Needs  . Financial resource strain: Not on file  . Food insecurity:    Worry: Not on file    Inability: Not on file  .  Transportation needs:    Medical: Not on file    Non-medical: Not on file  Tobacco Use  . Smoking status: Former Smoker    Last attempt to quit: 09/17/1958    Years since quitting: 59.1  . Smokeless tobacco: Never Used  Substance and Sexual Activity  . Alcohol use: Yes    Comment: occ  . Drug use: No  . Sexual activity: Not on file  Lifestyle  . Physical activity:    Days per week: Not on file    Minutes per session: Not on file  . Stress: Not on file  Relationships  . Social connections:    Talks on phone: Not on file    Gets together: Not on file    Attends religious service: Not on file    Active member of club or organization: Not on file    Attends meetings of clubs or organizations: Not on file    Relationship status: Not on file  Other Topics Concern  . Not on file  Social History Narrative  . Not on file     Family History: The patient's family history includes Cancer in her mother; Coronary artery disease in her father; Heart attack in her father.  ROS:   Please see the history of present illness.     All other systems reviewed and are negative.  EKGs/Labs/Other Studies Reviewed:    The following studies were reviewed today: recoreds from Ortho     Recent Labs: No results found for requested labs within last 8760 hours.  Recent Lipid Panel No results found for: CHOL, TRIG, HDL, CHOLHDL, VLDL, LDLCALC, LDLDIRECT  Physical Exam:    VS:  BP 128/70   Pulse 67   Ht 5\' 2"  (1.575 m)   Wt 160 lb 6.4 oz (72.8 kg)   SpO2 98%   BMI 29.34 kg/m     Wt Readings from Last 3 Encounters:  11/12/17 160 lb 6.4 oz (72.8 kg)  03/10/17 162 lb 3.2 oz (73.6 kg)  01/23/16 165 lb 6.4 oz (75 kg)     GEN:  Well nourished, well developed elderly female  in no acute distress HEENT: Normal NECK: No JVD; No carotid bruits LYMPHATICS: No lymphadenopathy CARDIAC:  RR, no murmurs  RESPIRATORY:  Clear to auscultation without rales, wheezing or rhonchi  ABDOMEN: Soft,  non-tender, non-distended MUSCULOSKELETAL:  No edema; No deformity  SKIN: Warm and dry NEUROLOGIC:  Alert and oriented x 3 PSYCHIATRIC:  Normal affect   EKG: November 12, 2017: Normal sinus rhythm at 67.  The EKG is normal.  ASSESSMENT:    No diagnosis found. PLAN:    In order of problems listed above:  1. Palpitations: The patient was recently seen by  orthopedics.  She is never had any heart problems.  She has had quite a bit of pain with her right knee and felt that she had to exert a great effort and walking into the office.  She stated that her heart was shaking she got into the office.  It is possible that she had some benign premature atrial contractions or perhaps premature ventricular contractions.  There is no evidence of any serious arrhythmias today. Her exam is normal.  I reassured her that she is very stable and is at low risk for her upcoming knee surgery.  We will see heron an as-needed basis.   Medication Adjustments/Labs and Tests Ordered: Current medicines are reviewed at length with the patient today.  Concerns regarding medicines are outlined above.  No orders of the defined types were placed in this encounter.  No orders of the defined types were placed in this encounter.   Signed, Kristeen Miss, MD  11/12/2017 10:55 AM    [facility]

## 2017-11-19 ENCOUNTER — Other Ambulatory Visit (HOSPITAL_COMMUNITY): Payer: Self-pay | Admitting: Orthopedic Surgery

## 2017-11-19 DIAGNOSIS — M79604 Pain in right leg: Secondary | ICD-10-CM

## 2017-11-19 DIAGNOSIS — M1711 Unilateral primary osteoarthritis, right knee: Secondary | ICD-10-CM | POA: Diagnosis not present

## 2017-11-19 DIAGNOSIS — M7989 Other specified soft tissue disorders: Secondary | ICD-10-CM

## 2017-11-20 ENCOUNTER — Ambulatory Visit (HOSPITAL_COMMUNITY)
Admission: RE | Admit: 2017-11-20 | Discharge: 2017-11-20 | Disposition: A | Discharge status: Home / Self Care | Payer: Medicare Other | Source: Ambulatory Visit | Attending: Orthopedic Surgery | Admitting: Orthopedic Surgery

## 2017-11-20 DIAGNOSIS — M7989 Other specified soft tissue disorders: Secondary | ICD-10-CM | POA: Diagnosis not present

## 2017-11-20 DIAGNOSIS — M79604 Pain in right leg: Secondary | ICD-10-CM

## 2017-11-20 NOTE — Progress Notes (Signed)
Right lower extremity venous duplex completed. There is no evidence of a DVT or superficial thrombosis. There ids a small fluid filled area 2.5 cm x 0.8 cm in the popliteal fossa consistent with a Baker's cyst.  Toma DeitersVirginia Eman Rynders, RVS  11/20/2017, 11:20 AM

## 2017-12-03 ENCOUNTER — Other Ambulatory Visit: Payer: Self-pay | Admitting: Allergy and Immunology

## 2017-12-10 ENCOUNTER — Ambulatory Visit: Payer: Self-pay | Admitting: Physician Assistant

## 2017-12-14 ENCOUNTER — Other Ambulatory Visit (HOSPITAL_COMMUNITY): Payer: Medicare Other

## 2017-12-25 ENCOUNTER — Inpatient Hospital Stay (HOSPITAL_COMMUNITY): Admission: RE | Admit: 2017-12-25 | Payer: Medicare Other | Source: Ambulatory Visit | Admitting: Orthopedic Surgery

## 2017-12-25 ENCOUNTER — Encounter (HOSPITAL_COMMUNITY): Admission: RE | Payer: Self-pay | Source: Ambulatory Visit

## 2017-12-25 SURGERY — ARTHROPLASTY, KNEE, TOTAL
Anesthesia: Choice | Laterality: Right

## 2018-01-05 DIAGNOSIS — H905 Unspecified sensorineural hearing loss: Secondary | ICD-10-CM | POA: Diagnosis not present

## 2018-01-05 DIAGNOSIS — Z79899 Other long term (current) drug therapy: Secondary | ICD-10-CM | POA: Diagnosis not present

## 2018-01-05 DIAGNOSIS — R600 Localized edema: Secondary | ICD-10-CM | POA: Diagnosis not present

## 2018-01-05 DIAGNOSIS — I10 Essential (primary) hypertension: Secondary | ICD-10-CM | POA: Diagnosis not present

## 2018-01-08 ENCOUNTER — Ambulatory Visit (INDEPENDENT_AMBULATORY_CARE_PROVIDER_SITE_OTHER): Payer: Medicare Other | Admitting: Podiatry

## 2018-01-08 ENCOUNTER — Encounter: Payer: Self-pay | Admitting: Podiatry

## 2018-01-08 DIAGNOSIS — L600 Ingrowing nail: Secondary | ICD-10-CM | POA: Diagnosis not present

## 2018-01-08 NOTE — Patient Instructions (Signed)

## 2018-01-11 NOTE — Progress Notes (Signed)
Subjective:   Patient ID: Rhonda Watkins, female   DOB: 84 y.o.   MRN: [id number]   HPI Patient presents with ingrown toenail deformity of the right hallux lateral border that is been painful and make shoe gear difficult.  Patient states is been present for a number of weeks and she is tried to soak it trim it herself and cannot get it out   Review of Systems  All other systems reviewed and are negative.       Objective:  Physical Exam  Constitutional: She appears well-developed and well-nourished.  Cardiovascular: Intact distal pulses.  Pulmonary/Chest: Effort normal.  Musculoskeletal: Normal range of motion.  Neurological: She is alert.  Skin: Skin is warm.  Nursing note and vitals reviewed.   Neurovascular status found to be intact muscle strength is adequate with incurvated nail bed right hallux lateral border that is painful when pressed and make shoe gear difficult.  Patient's found to have good digital perfusion well oriented x3     Assessment:  Ingrown toenail deformity of the right hallux lateral border with no indication of infection but quite painful     Plan:  H&P condition reviewed and recommended removal of the nail border.  Explained procedure and risk and patient wants surgery and today I infiltrated the right hallux 60 mg like Marcaine mixture and under sterile prep using sterile instrumentation I remove the lateral border exposed matrix and applied phenol 3 applications 30 seconds followed by alcohol lavage and sterile dressing.  Gave instructions on soaks and reappoint

## 2018-01-15 DIAGNOSIS — H52223 Regular astigmatism, bilateral: Secondary | ICD-10-CM | POA: Diagnosis not present

## 2018-02-16 DIAGNOSIS — M5440 Lumbago with sciatica, unspecified side: Secondary | ICD-10-CM | POA: Diagnosis not present

## 2018-02-16 DIAGNOSIS — I10 Essential (primary) hypertension: Secondary | ICD-10-CM | POA: Diagnosis not present

## 2018-02-16 DIAGNOSIS — Z6829 Body mass index (BMI) 29.0-29.9, adult: Secondary | ICD-10-CM | POA: Diagnosis not present

## 2018-02-18 ENCOUNTER — Other Ambulatory Visit: Payer: Self-pay | Admitting: Neurosurgery

## 2018-02-18 DIAGNOSIS — M5440 Lumbago with sciatica, unspecified side: Secondary | ICD-10-CM

## 2018-02-23 ENCOUNTER — Other Ambulatory Visit: Payer: Self-pay | Admitting: Allergy and Immunology

## 2018-03-01 ENCOUNTER — Ambulatory Visit
Admission: RE | Admit: 2018-03-01 | Discharge: 2018-03-01 | Disposition: A | Discharge status: Home / Self Care | Payer: Medicare Other | Source: Ambulatory Visit | Attending: Neurosurgery | Admitting: Neurosurgery

## 2018-03-01 DIAGNOSIS — M5440 Lumbago with sciatica, unspecified side: Secondary | ICD-10-CM

## 2018-03-01 DIAGNOSIS — M47817 Spondylosis without myelopathy or radiculopathy, lumbosacral region: Secondary | ICD-10-CM | POA: Diagnosis not present

## 2018-03-01 MED ORDER — METHYLPREDNISOLONE ACETATE 40 MG/ML INJ SUSP (RADIOLOG: 120.0000 mg | Freq: Once | INTRAMUSCULAR | Status: DC

## 2018-03-01 MED ORDER — IOPAMIDOL (ISOVUE-M 200) INJECTION 41%: 1.0000 mL | Freq: Once | INTRAMUSCULAR | Status: DC

## 2018-03-01 NOTE — Discharge Instructions (Signed)

## 2018-03-10 DIAGNOSIS — E1169 Type 2 diabetes mellitus with other specified complication: Secondary | ICD-10-CM | POA: Diagnosis not present

## 2018-03-10 DIAGNOSIS — S61511A Laceration without foreign body of right wrist, initial encounter: Secondary | ICD-10-CM | POA: Diagnosis not present

## 2018-03-10 DIAGNOSIS — E7800 Pure hypercholesterolemia, unspecified: Secondary | ICD-10-CM | POA: Diagnosis not present

## 2018-03-10 DIAGNOSIS — I10 Essential (primary) hypertension: Secondary | ICD-10-CM | POA: Diagnosis not present

## 2018-03-16 ENCOUNTER — Ambulatory Visit: Payer: Medicare Other | Admitting: Allergy and Immunology

## 2018-03-16 DIAGNOSIS — M5126 Other intervertebral disc displacement, lumbar region: Secondary | ICD-10-CM | POA: Diagnosis not present

## 2018-03-19 ENCOUNTER — Other Ambulatory Visit: Payer: Self-pay | Admitting: Allergy and Immunology

## 2018-03-23 ENCOUNTER — Ambulatory Visit: Payer: Medicare Other | Admitting: Allergy and Immunology

## 2018-03-23 ENCOUNTER — Encounter: Payer: Self-pay | Admitting: Allergy and Immunology

## 2018-03-23 VITALS — BP 130/72 | HR 72 | Temp 97.7°F | Resp 16 | Ht 61.0 in | Wt 158.8 lb

## 2018-03-23 DIAGNOSIS — K219 Gastro-esophageal reflux disease without esophagitis: Secondary | ICD-10-CM | POA: Diagnosis not present

## 2018-03-23 DIAGNOSIS — J4530 Mild persistent asthma, uncomplicated: Secondary | ICD-10-CM | POA: Diagnosis not present

## 2018-03-23 DIAGNOSIS — J3089 Other allergic rhinitis: Secondary | ICD-10-CM

## 2018-03-23 MED ORDER — OMEPRAZOLE 20 MG PO CPDR: 20.0000 mg | DELAYED_RELEASE_CAPSULE | Freq: Every day | ORAL | 5 refills | Status: DC

## 2018-03-23 NOTE — Patient Instructions (Signed)
  1. Continue Asmanex 220 one inhalation 1-2 time per day  2. Continue Prilosec 20 mg in a.m. plus ranitidine 300 mg in PM  3. Continue ProAir HFA 2 puffs every 4-6 hours if needed  4. Return to clinic in 6 months or earlier if problem  5. Obtain fall flu vaccine

## 2018-03-23 NOTE — Progress Notes (Signed)
Follow-up Note  Referring Provider: Merlene LaughterStoneking, Hal, MD Primary Provider: Merlene LaughterStoneking, Hal, MD Date of Office Visit: 03/23/2018  Subjective:   Rhonda Watkins (DOB: 06-22-1932) is a 84 y.o. female [facility] in re-evaluation of the following:  HPI: Rhonda Watkins presents to this clinic in evaluation of asthma and allergic rhinoconjunctivitis and LPR.  Her last visit to this clinic was 29 September 2017.  She has really done well since her last visit with me and has not had any significant issues involving her respiratory tract while consistently using relatively low-dose Asmanex at 220 mcg/day and continuing to treat her reflux with ranitidine and occasional omeprazole.  She has not required a systemic steroid or antibiotic to treat any type of respiratory tract issue.  She can exert herself to the extent that her musculoskeletal issues allow her to do so and she does not use a short acting bronchodilator.  She did cut her left thumb on a pill cutter this morning.  She shows me the size of her cut with her right hand and it looks like this cut was 1/2 inch long.  Rhonda Watkins did wash this cut with alcohol and placed a dab of Neosporin and bandaged up this cut.  She does not have any pain at this point in time.  Allergies as of 03/23/2018      Reactions   Amlodipine    DO NOT RECALL ANY REACTIONS TO ANY OF THE MEDICATIONS   Avelox [moxifloxacin Hcl In Nacl]    Crestor [rosuvastatin]    Dynacirc [isradipine]    Enablex [darifenacin Hydrobromide Er]    Latex    Lescol [fluvastatin Sodium]    Lipitor [atorvastatin]    Lovastatin    Mobic [meloxicam]    Norvasc [amlodipine Besylate]    Novocain [procaine]    Penicillins    Pravachol [pravastatin Sodium]    Questran [cholestyramine]    Toprol Xl [metoprolol Tartrate]    Ultracet [tramadol-acetaminophen]    Welchol [colesevelam Hcl]    Zetia [ezetimibe]    Zocor [simvastatin]       Medication List        albuterol 108 (90 Base) MCG/ACT inhaler Commonly known as:  PROVENTIL HFA;VENTOLIN HFA Inhale two puffs every four to six hours as needed for cough or wheeze.   aspirin 81 MG tablet Take 81 mg by mouth daily.   atorvastatin 10 MG tablet Commonly known as:  LIPITOR   diltiazem 180 MG 24 hr capsule Commonly known as:  CARDIZEM CD   diltiazem 180 MG 24 hr capsule Commonly known as:  DILACOR XR Take 180 mg by mouth daily.   furosemide 20 MG tablet Commonly known as:  LASIX Take 20 mg by mouth daily.   furosemide 40 MG tablet Commonly known as:  LASIX Take 40 mg by mouth every other day.   JANUVIA 50 MG tablet Generic drug:  sitaGLIPtin   LIPO-FLAVONOID PLUS PO Take 1 capsule by mouth 2 (two) times daily.   MICRO-K 8 MEQ Cpcr capsule CR Generic drug:  Potassium Chloride CR Take 8 mEq by mouth daily.   mometasone 220 MCG/INH inhaler Commonly known as:  ASMANEX INHALE 2 PUFFS INTO LUNGS ONCE A DAY.   MULTI COMPLETE PO Take 1 tablet by mouth daily.   omeprazole 20 MG capsule Commonly known as:  PRILOSEC Take 1 capsule (20 mg total) by mouth daily.   ranitidine 300 MG tablet Commonly known as:  ZANTAC  TAKE ONE TABLET AT BEDTIME.   telmisartan 80 MG tablet Commonly known as:  MICARDIS Take 80 mg by mouth daily.   vitamin C 1000 MG tablet Take 1,000 mg by mouth daily.   VITAMIN D PO Take 3,000 Units by mouth daily.       Past Medical History:  Diagnosis Date  . Arthritis   . Asthma   . GERD (gastroesophageal reflux disease)   . Hyperlipidemia   . Hypertension   . Mitral regurgitation   . Shortness of breath dyspnea    occ  . UTI (lower urinary tract infection)   . Vitamin D deficiency     Past Surgical History:  Procedure Laterality Date  . BACK SURGERY  85,04  . BLADDER REPAIR  95,05  . BUNIONECTOMY Left 15  . cholecyctectomy    . CHOLECYSTECTOMY  80  . COLON SURGERY  05  . EXPLORATION MIDDLE EAR Left    vertigo- no hearing now- 84  yrs old  . HERNIA REPAIR  06,08,11  . KNEE ARTHROSCOPY     lft 02 rt 09    Review of systems negative except as noted in HPI / PMHx or noted below:  Review of Systems  Constitutional: Negative.   HENT: Negative.   Eyes: Negative.   Respiratory: Negative.   Cardiovascular: Negative.   Gastrointestinal: Negative.   Genitourinary: Negative.   Musculoskeletal: Negative.   Skin: Negative.   Neurological: Negative.   Endo/Heme/Allergies: Negative.   Psychiatric/Behavioral: Negative.      Objective:   Vitals:   03/23/18 1052  BP: 130/72  Pulse: 72  Resp: 16  Temp: 97.7 F (36.5 C)  SpO2: 98%   Height: 5\' 1"  (154.9 cm)  Weight: 158 lb 12.8 oz (72 kg)   Physical Exam  HENT:  Head: Normocephalic.  Right Ear: External ear normal.  Left Ear: External ear normal.  Nose: Nose normal. No mucosal edema or rhinorrhea.  Mouth/Throat: Uvula is midline, oropharynx is clear and moist and mucous membranes are normal. No oropharyngeal exudate.  Hearing aids  Eyes: Conjunctivae are normal.  Neck: Trachea normal. No tracheal tenderness present. No tracheal deviation present. No thyromegaly present.  Cardiovascular: Normal rate, regular rhythm, S1 normal, S2 normal and normal heart sounds.  No murmur heard. Pulmonary/Chest: Breath sounds normal. No stridor. No respiratory distress. She has no wheezes. She has no rales.  Musculoskeletal: She exhibits no edema.  Bandaged left thumb  Lymphadenopathy:       Head (right side): No tonsillar adenopathy present.       Head (left side): No tonsillar adenopathy present.    She has no cervical adenopathy.  Neurological: She is alert.  Skin: No rash noted. She is not diaphoretic. No erythema. Nails show no clubbing.    Diagnostics:    Spirometry was performed and demonstrated an FEV1 of 2.02 at 129 % of predicted.  The patient had an Asthma Control Test with the following results: ACT Total Score: 24.    Assessment and Plan:   1.  Asthma, well controlled, mild persistent   2. Other allergic rhinitis   3. LPRD (laryngopharyngeal reflux disease)     1. Continue Asmanex 220 one inhalation 1-2 time per day  2. Continue Prilosec 20 mg in a.m. plus ranitidine 300 mg in PM  3. Continue ProAir HFA 2 puffs every 4-6 hours if needed  4. Return to clinic in 6 months or earlier if problem  5. Obtain fall flu vaccine  Overall Aliyah  had another very good interval of time regarding her respiratory tract issues and she will continue to utilize anti-inflammatory agents for her airway and therapy directed against reflux as noted above and I will see her back in this clinic in 6 months or earlier if there is a problem.  Laurette Schimke, MD Allergy / Immunology [facility]

## 2018-03-24 ENCOUNTER — Encounter: Payer: Self-pay | Admitting: Allergy and Immunology

## 2018-03-24 NOTE — Addendum Note (Signed)
Addended by: Mliss FritzBLACK, Ande Therrell I on: 03/24/2018 07:36 AM   Modules accepted: Orders

## 2018-03-26 DIAGNOSIS — H26492 Other secondary cataract, left eye: Secondary | ICD-10-CM | POA: Diagnosis not present

## 2018-03-28 DIAGNOSIS — R319 Hematuria, unspecified: Secondary | ICD-10-CM | POA: Diagnosis not present

## 2018-03-28 DIAGNOSIS — R300 Dysuria: Secondary | ICD-10-CM | POA: Diagnosis not present

## 2018-03-28 DIAGNOSIS — N390 Urinary tract infection, site not specified: Secondary | ICD-10-CM | POA: Diagnosis not present

## 2018-04-08 DIAGNOSIS — H26491 Other secondary cataract, right eye: Secondary | ICD-10-CM | POA: Diagnosis not present

## 2018-04-26 DIAGNOSIS — H52223 Regular astigmatism, bilateral: Secondary | ICD-10-CM | POA: Diagnosis not present

## 2018-05-07 DIAGNOSIS — M180 Bilateral primary osteoarthritis of first carpometacarpal joints: Secondary | ICD-10-CM | POA: Diagnosis not present

## 2018-05-11 ENCOUNTER — Other Ambulatory Visit: Payer: Self-pay | Admitting: Allergy and Immunology

## 2018-05-11 ENCOUNTER — Other Ambulatory Visit: Payer: Self-pay | Admitting: *Deleted

## 2018-05-11 MED ORDER — MOMETASONE FUROATE 220 MCG/INH IN AEPB: 2.0000 | INHALATION_SPRAY | Freq: Every day | RESPIRATORY_TRACT | 5 refills | Status: DC

## 2018-05-12 DIAGNOSIS — M180 Bilateral primary osteoarthritis of first carpometacarpal joints: Secondary | ICD-10-CM | POA: Diagnosis not present

## 2018-05-14 DIAGNOSIS — E1169 Type 2 diabetes mellitus with other specified complication: Secondary | ICD-10-CM | POA: Diagnosis not present

## 2018-05-14 DIAGNOSIS — M79644 Pain in right finger(s): Secondary | ICD-10-CM | POA: Diagnosis not present

## 2018-05-14 DIAGNOSIS — I10 Essential (primary) hypertension: Secondary | ICD-10-CM | POA: Diagnosis not present

## 2018-05-14 DIAGNOSIS — R202 Paresthesia of skin: Secondary | ICD-10-CM | POA: Diagnosis not present

## 2018-05-17 DIAGNOSIS — N390 Urinary tract infection, site not specified: Secondary | ICD-10-CM | POA: Diagnosis not present

## 2018-06-09 DIAGNOSIS — N390 Urinary tract infection, site not specified: Secondary | ICD-10-CM | POA: Diagnosis not present

## 2018-06-14 DIAGNOSIS — M1811 Unilateral primary osteoarthritis of first carpometacarpal joint, right hand: Secondary | ICD-10-CM | POA: Diagnosis not present

## 2018-06-23 DIAGNOSIS — N390 Urinary tract infection, site not specified: Secondary | ICD-10-CM | POA: Diagnosis not present

## 2018-06-25 DIAGNOSIS — E1169 Type 2 diabetes mellitus with other specified complication: Secondary | ICD-10-CM | POA: Diagnosis not present

## 2018-06-25 DIAGNOSIS — I10 Essential (primary) hypertension: Secondary | ICD-10-CM | POA: Diagnosis not present

## 2018-06-25 DIAGNOSIS — E7800 Pure hypercholesterolemia, unspecified: Secondary | ICD-10-CM | POA: Diagnosis not present

## 2018-07-04 ENCOUNTER — Other Ambulatory Visit: Payer: Self-pay | Admitting: Allergy and Immunology

## 2018-07-15 DIAGNOSIS — R39198 Other difficulties with micturition: Secondary | ICD-10-CM | POA: Diagnosis not present

## 2018-07-26 DIAGNOSIS — M1811 Unilateral primary osteoarthritis of first carpometacarpal joint, right hand: Secondary | ICD-10-CM | POA: Diagnosis not present

## 2018-08-26 DIAGNOSIS — N390 Urinary tract infection, site not specified: Secondary | ICD-10-CM | POA: Diagnosis not present

## 2018-08-31 DIAGNOSIS — Z1389 Encounter for screening for other disorder: Secondary | ICD-10-CM | POA: Diagnosis not present

## 2018-08-31 DIAGNOSIS — K219 Gastro-esophageal reflux disease without esophagitis: Secondary | ICD-10-CM | POA: Diagnosis not present

## 2018-08-31 DIAGNOSIS — I10 Essential (primary) hypertension: Secondary | ICD-10-CM | POA: Diagnosis not present

## 2018-08-31 DIAGNOSIS — Z0000 Encounter for general adult medical examination without abnormal findings: Secondary | ICD-10-CM | POA: Diagnosis not present

## 2018-08-31 DIAGNOSIS — J4530 Mild persistent asthma, uncomplicated: Secondary | ICD-10-CM | POA: Diagnosis not present

## 2018-09-07 ENCOUNTER — Telehealth: Payer: Self-pay

## 2018-09-07 ENCOUNTER — Other Ambulatory Visit: Payer: Self-pay | Admitting: *Deleted

## 2018-09-07 MED ORDER — FAMOTIDINE 40 MG PO TABS: 40.0000 mg | ORAL_TABLET | Freq: Every day | ORAL | 1 refills | Status: DC

## 2018-09-07 NOTE — Telephone Encounter (Signed)
Spoke with the patient and informed of switching the Zantac to Pepcid. A prescription has been sent in to [facility]. Patient verbalized understanding.

## 2018-09-07 NOTE — Telephone Encounter (Signed)
Patient is currently taking ranitidine and was wondering what else can she take since its recalled.  Please Advise.

## 2018-09-08 DIAGNOSIS — E1169 Type 2 diabetes mellitus with other specified complication: Secondary | ICD-10-CM | POA: Diagnosis not present

## 2018-09-08 DIAGNOSIS — N390 Urinary tract infection, site not specified: Secondary | ICD-10-CM | POA: Diagnosis not present

## 2018-09-27 DIAGNOSIS — N390 Urinary tract infection, site not specified: Secondary | ICD-10-CM | POA: Diagnosis not present

## 2018-09-28 DIAGNOSIS — L218 Other seborrheic dermatitis: Secondary | ICD-10-CM | POA: Diagnosis not present

## 2018-09-28 DIAGNOSIS — L570 Actinic keratosis: Secondary | ICD-10-CM | POA: Diagnosis not present

## 2018-09-29 ENCOUNTER — Telehealth: Payer: Self-pay | Admitting: Allergy and Immunology

## 2018-09-29 NOTE — Telephone Encounter (Signed)
Patient is c/o runny nose, sneezing, slight cough x3 days. She is currently taking sulfa antibiotic for UTI treatment. She did started Tradjenta for her diabetes. She wanted to know if you thought she could take mucinex with these medications as well as any other recommendations you may have. Please advise and thank you.

## 2018-09-29 NOTE — Telephone Encounter (Signed)
Called and advised patient of instructions 

## 2018-09-29 NOTE — Telephone Encounter (Signed)
Please inform patient that for this most recent event she can use nasal saline twice a day, Mucinex DM twice a day, and Claritin twice a day

## 2018-09-29 NOTE — Telephone Encounter (Signed)
Pt called and said she could not come in but she sick and nose is running all the time and needs to know what to take [facility] . [phone].

## 2018-10-14 DIAGNOSIS — R300 Dysuria: Secondary | ICD-10-CM | POA: Diagnosis not present

## 2018-10-27 DIAGNOSIS — M180 Bilateral primary osteoarthritis of first carpometacarpal joints: Secondary | ICD-10-CM | POA: Diagnosis not present

## 2018-11-09 ENCOUNTER — Ambulatory Visit: Payer: Medicare Other | Admitting: Allergy and Immunology

## 2018-11-13 ENCOUNTER — Other Ambulatory Visit: Payer: Self-pay | Admitting: Allergy and Immunology

## 2018-12-07 ENCOUNTER — Ambulatory Visit: Payer: Medicare Other | Admitting: Allergy and Immunology

## 2018-12-09 ENCOUNTER — Telehealth: Payer: Self-pay | Admitting: Allergy and Immunology

## 2018-12-09 MED ORDER — BUDESONIDE 180 MCG/ACT IN AEPB: 2.0000 | INHALATION_SPRAY | Freq: Every day | RESPIRATORY_TRACT | 5 refills | Status: DC

## 2018-12-09 NOTE — Telephone Encounter (Signed)
Replace Asmanex with Pulmicort 180-same instructions

## 2018-12-09 NOTE — Telephone Encounter (Signed)
Prescription has been changed as instructed. Patient is aware and verbalized understanding.

## 2018-12-09 NOTE — Telephone Encounter (Signed)
Patient has been on asmanex for years Patient states the past few inhalers are having issues with counting the puffs The counting is not accurate Is there an alternate that she ca use - that functions the same?? Please call patient to answer any questions  Patient uses [facility]

## 2018-12-09 NOTE — Telephone Encounter (Signed)
Please provide alternative medication of Asmanex

## 2018-12-20 ENCOUNTER — Telehealth: Payer: Self-pay | Admitting: *Deleted

## 2018-12-20 MED ORDER — FLUTICASONE FUROATE 100 MCG/ACT IN AEPB: 1.0000 | INHALATION_SPRAY | Freq: Every day | RESPIRATORY_TRACT | 3 refills | Status: DC

## 2018-12-20 NOTE — Addendum Note (Signed)
Addended by: Mariane Duval on: 12/20/2018 02:53 PM   Modules accepted: Orders

## 2018-12-20 NOTE — Telephone Encounter (Signed)
Please explain the situation to the patient regarding insurance blocking her Pulmicort and have her try Arnuity 100-1 inhalation daily

## 2018-12-20 NOTE — Telephone Encounter (Signed)
Informed patient of insurance denial. She understood and also told her to make sure the pharmacist went over how to use the new inhaler. She will call with an update.

## 2018-12-20 NOTE — Telephone Encounter (Signed)
PA has been submitted for Pulmicort 180 through CoverMyMeds. Currently waiting for approval or denial.

## 2018-12-20 NOTE — Telephone Encounter (Signed)
Patient's insurance denied Pulmicort. Insurance prefers Asmanex, Arnuity, Optometrist. Patient has tried and failed Asmanex and she needs to try and fail at least two. Please advise.

## 2019-01-11 ENCOUNTER — Ambulatory Visit: Payer: Medicare Other | Admitting: Allergy and Immunology

## 2019-01-11 ENCOUNTER — Encounter: Payer: Self-pay | Admitting: Allergy and Immunology

## 2019-01-11 ENCOUNTER — Other Ambulatory Visit: Payer: Self-pay

## 2019-01-11 VITALS — BP 130/84 | HR 76 | Resp 16 | Ht 60.5 in | Wt 161.2 lb

## 2019-01-11 DIAGNOSIS — J3089 Other allergic rhinitis: Secondary | ICD-10-CM | POA: Diagnosis not present

## 2019-01-11 DIAGNOSIS — J4530 Mild persistent asthma, uncomplicated: Secondary | ICD-10-CM | POA: Diagnosis not present

## 2019-01-11 DIAGNOSIS — K219 Gastro-esophageal reflux disease without esophagitis: Secondary | ICD-10-CM

## 2019-01-11 NOTE — Patient Instructions (Addendum)
  1. Continue Arnuity 100 one inhalation 1 time per day  2. Continue Prilosec 20 mg in a.m. + famotidine 40 mg in p.m.  3. Continue Albuterol HFA 2 puffs every 4-6 hours if needed  4. Return to clinic in 12 months or earlier if problem  5. Obtain fall flu vaccine

## 2019-01-11 NOTE — Progress Notes (Signed)
[place]   Follow-up Note  Referring Provider: Merlene LaughterStoneking, Hal, MD Primary Provider: Merlene LaughterStoneking, Hal, MD Date of Office Visit: 01/11/2019  Subjective:   Rhonda Watkins (DOB: [birth year]) is a 85 y.o. female who [facility] in re-evaluation of the following:  HPI: Rhonda Watkins presents to this clinic in evaluation of asthma and allergic rhinoconjunctivitis and LPR.  Her last visit to this clinic was 23 March 2018.  Overall she has really done well with her respiratory tract and has not required a systemic steroid or an antibiotic for any type of respiratory tract issue.  She rarely uses any short acting bronchodilator.  She continues on an inhaled steroid on a daily basis.  Because of an insurance issue she has tried multiple inhaled steroids and we have now settled on insurance approved Arnuity at 100 mcg daily.  She has had no issues with her nose.  She has had no issues with reflux in her throat while maintaining therapy with a proton pump inhibitor and H2 receptor blocker.  She did receive the flu vaccine last year.  Allergies as of 01/11/2019   No Known Allergies     Medication List      albuterol 108 (90 Base) MCG/ACT inhaler Commonly known as:  ProAir HFA USE 2 PUFFS EVERY 4 TO 6 HOURS AS NEEDED FOR COUGH/WHEEZING.   aspirin 81 MG tablet Take 81 mg by mouth daily.   atorvastatin 10 MG tablet Commonly known as:  LIPITOR   diltiazem 180 MG 24 hr capsule Commonly known as:  CARDIZEM CD   diltiazem 180 MG 24 hr capsule Commonly known as:  DILACOR XR Take 180 mg by mouth daily.   famotidine 40 MG tablet Commonly known as:  PEPCID Take 1 tablet (40 mg total) by mouth at bedtime.   famotidine 20 MG tablet Commonly known as:  PEPCID   Fluticasone Furoate 100 MCG/ACT Aepb Commonly known as:  Arnuity Ellipta Inhale 1 puff into the lungs daily.   furosemide 20 MG tablet Commonly  known as:  LASIX Take 20 mg by mouth daily.   furosemide 40 MG tablet Commonly known as:  LASIX Take 40 mg by mouth every other day.   LIPO-FLAVONOID PLUS PO Take 1 capsule by mouth 2 (two) times daily.   Micro-K 8 MEQ Cpcr capsule CR Generic drug:  Potassium Chloride CR Take 8 mEq by mouth daily.   MULTI COMPLETE PO Take 1 tablet by mouth daily.   omeprazole 20 MG capsule Commonly known as:  PRILOSEC Take 1 capsule (20 mg total) by mouth daily.   telmisartan 80 MG tablet Commonly known as:  MICARDIS Take 80 mg by mouth daily.   Tradjenta 5 MG Tabs tablet Generic drug:  linagliptin   VITAMIN D PO Take 3,000 Units by mouth daily.       Past Medical History:  Diagnosis Date  . Arthritis   . Asthma   . GERD (gastroesophageal reflux disease)   . Hyperlipidemia   . Hypertension   . Mitral regurgitation   . Shortness of breath dyspnea    occ  . UTI (lower urinary tract infection)   . Vitamin D deficiency     Past Surgical History:  Procedure Laterality Date  . BACK SURGERY  85,04  . BLADDER REPAIR  95,05  . BUNIONECTOMY Left 15  . cholecyctectomy    . CHOLECYSTECTOMY  80  . COLON SURGERY  05  . EXPLORATION MIDDLE EAR Left    vertigo- no hearing now- 85 yrs old  . HERNIA REPAIR  06,08,11  . KNEE ARTHROSCOPY     lft 02 rt 09    Review of systems negative except as noted in HPI / PMHx or noted below:  Review of Systems  Constitutional: Negative.   HENT: Negative.   Eyes: Negative.   Respiratory: Negative.   Cardiovascular: Negative.   Gastrointestinal: Negative.   Genitourinary: Negative.   Musculoskeletal: Negative.   Skin: Negative.   Neurological: Negative.   Endo/Heme/Allergies: Negative.   Psychiatric/Behavioral: Negative.      Objective:   Vitals:   01/11/19 0959  BP: 130/84  Pulse: 76  Resp: 16  SpO2: 97%   Height: 5' 0.5" (153.7 cm)  Weight: 161 lb 3.2 oz (73.1 kg)   Physical Exam Constitutional:      Appearance: She is not  diaphoretic.  HENT:     Head: Normocephalic.     Comments: Right hearing aid    Right Ear: Tympanic membrane, ear canal and external ear normal.     Left Ear: Tympanic membrane, ear canal and external ear normal.     Nose: Nose normal. No mucosal edema or rhinorrhea.     Mouth/Throat:     Pharynx: Uvula midline. No oropharyngeal exudate.  Eyes:     Conjunctiva/sclera: Conjunctivae normal.  Neck:     Thyroid: No thyromegaly.     Trachea: Trachea normal. No tracheal tenderness or tracheal deviation.  Cardiovascular:     Rate and Rhythm: Normal rate and regular rhythm.     Heart sounds: S1 normal and S2 normal. Murmur (Systolic) present.  Pulmonary:     Effort: No respiratory distress.     Breath sounds: Normal breath sounds. No stridor. No wheezing or rales.  Lymphadenopathy:     Head:     Right side of head: No tonsillar adenopathy.     Left side of head: No tonsillar adenopathy.     Cervical: No cervical adenopathy.  Skin:    Findings: No erythema or rash.     Nails: There is no clubbing.   Neurological:     Mental Status: She is alert.     Diagnostics:    Spirometry was performed and demonstrated an FEV1 of 1.85 at 122 % of predicted.   Assessment and Plan:   1. Asthma, well controlled, mild persistent   2. Other allergic rhinitis   3. LPRD (laryngopharyngeal reflux disease)     1. Continue Arnuity 100 one inhalation 1 time per day  2. Continue Prilosec 20 mg in a.m. + famotidine 40 mg in p.m.  3. Continue Albuterol HFA 2 puffs every 4-6 hours if needed  4. Return to clinic in 12 months or earlier if problem  5. Obtain fall flu vaccine  Rhonda Watkins is really doing very well on her current plan of therapy directed against respiratory tract inflammation and reflux and she will continue to utilize this therapy as specified above.  I will see her back in this clinic in 1 year or earlier if there is a problem.  Rhonda Schimke, MD Allergy / Immunology [facility]

## 2019-01-12 ENCOUNTER — Encounter: Payer: Self-pay | Admitting: Allergy and Immunology

## 2019-01-19 DIAGNOSIS — H52223 Regular astigmatism, bilateral: Secondary | ICD-10-CM | POA: Diagnosis not present

## 2019-03-01 DIAGNOSIS — I10 Essential (primary) hypertension: Secondary | ICD-10-CM | POA: Diagnosis not present

## 2019-03-01 DIAGNOSIS — E1169 Type 2 diabetes mellitus with other specified complication: Secondary | ICD-10-CM | POA: Diagnosis not present

## 2019-03-07 DIAGNOSIS — M180 Bilateral primary osteoarthritis of first carpometacarpal joints: Secondary | ICD-10-CM | POA: Diagnosis not present

## 2019-03-07 DIAGNOSIS — M65332 Trigger finger, left middle finger: Secondary | ICD-10-CM | POA: Diagnosis not present

## 2019-03-11 ENCOUNTER — Other Ambulatory Visit: Payer: Self-pay | Admitting: *Deleted

## 2019-03-11 NOTE — Telephone Encounter (Signed)
Pt started a new inhaler, Arnuity around 4-5 weeks ago. Patient states that she is short of breath about once every day in the evening for a short period. This is a new issue and the only symptom. Patient is wondering if she should keep using the inhaler or switch to something different. She said it's not a huge issue but she wanted to let us know.Marland Kitchen

## 2019-03-14 ENCOUNTER — Other Ambulatory Visit: Payer: Self-pay | Admitting: Allergy and Immunology

## 2019-03-14 MED ORDER — ASMANEX HFA 200 MCG/ACT IN AERO: 2.0000 | INHALATION_SPRAY | Freq: Every day | RESPIRATORY_TRACT | 5 refills | Status: DC

## 2019-03-14 NOTE — Telephone Encounter (Signed)
Please inform patient that we will try and get her back on Asmanex 200-1-2 inhalations 1 time per day pending insurance approval.  This will require a prior authorization for failure of other ICS inhalers including Pulmicort and Arnuity and Flovent.

## 2019-03-14 NOTE — Telephone Encounter (Signed)
Please advise 

## 2019-03-14 NOTE — Telephone Encounter (Signed)
Sent in new rx for Asmanex . Called patient left voicemail to return call we need to inform patient of change

## 2019-03-14 NOTE — Telephone Encounter (Signed)
Patient called back advised medication was changed and sent to pharmacy. Patient verbalized understanding.

## 2019-03-16 ENCOUNTER — Telehealth: Payer: Self-pay | Admitting: Allergy and Immunology

## 2019-03-16 MED ORDER — ASMANEX HFA 200 MCG/ACT IN AERO: 2.0000 | INHALATION_SPRAY | Freq: Every day | RESPIRATORY_TRACT | 5 refills | Status: DC

## 2019-03-16 NOTE — Telephone Encounter (Signed)
Pt came in yesterday and was seen by dr Neldon Mc and she said that they were going to call in asmanex 200 or 220 and did have none at pharmacy.  She uses [facility] [phone].

## 2019-03-16 NOTE — Telephone Encounter (Signed)
Resent in rx

## 2019-04-12 DIAGNOSIS — R39198 Other difficulties with micturition: Secondary | ICD-10-CM | POA: Diagnosis not present

## 2019-05-23 DIAGNOSIS — Z23 Encounter for immunization: Secondary | ICD-10-CM | POA: Diagnosis not present

## 2019-05-23 DIAGNOSIS — N390 Urinary tract infection, site not specified: Secondary | ICD-10-CM | POA: Diagnosis not present

## 2019-06-01 ENCOUNTER — Telehealth: Payer: Self-pay | Admitting: Allergy and Immunology

## 2019-06-01 ENCOUNTER — Other Ambulatory Visit: Payer: Self-pay | Admitting: Allergy and Immunology

## 2019-06-01 MED ORDER — ASMANEX (30 METERED DOSES) 220 MCG/INH IN AEPB: INHALATION_SPRAY | RESPIRATORY_TRACT | 5 refills | Status: DC

## 2019-06-01 NOTE — Telephone Encounter (Signed)
Asmanex 220 sent to patient pharmacy.

## 2019-06-01 NOTE — Telephone Encounter (Signed)
PA for Asmanex inhaler initiated through covermymeds.

## 2019-06-01 NOTE — Telephone Encounter (Signed)
Please see if we can get her back on her "old" Asmanex delivery system.  Please let me know.

## 2019-06-01 NOTE — Telephone Encounter (Signed)
Pt called and said that she did not think the asmanex 200 was doing as good as the old asmanex 220 mc did. [place] [facility] [phone].

## 2019-06-01 NOTE — Telephone Encounter (Signed)
Patient stated she has been taking Asthma for several years and stated it helped in the past. Patient is was given a pump inhaler for SOB and is not noticing any difference. She is still having SOB and wanted to know if there are any suggestions Dr. Neldon Mc can give her.

## 2019-06-02 NOTE — Telephone Encounter (Signed)
PA for Asmanex was cancelled due to it being under patient formulary

## 2019-06-03 ENCOUNTER — Encounter (INDEPENDENT_AMBULATORY_CARE_PROVIDER_SITE_OTHER): Payer: Self-pay

## 2019-06-16 ENCOUNTER — Other Ambulatory Visit: Payer: Self-pay | Admitting: *Deleted

## 2019-06-16 MED ORDER — ASMANEX (60 METERED DOSES) 220 MCG/INH IN AEPB
1.0000 | INHALATION_SPRAY | Freq: Every day | RESPIRATORY_TRACT | 5 refills | Status: DC
Start: 2019-06-16 — End: 2020-01-17

## 2019-07-14 DIAGNOSIS — M5440 Lumbago with sciatica, unspecified side: Secondary | ICD-10-CM | POA: Insufficient documentation

## 2019-07-21 ENCOUNTER — Other Ambulatory Visit: Payer: Self-pay | Admitting: Neurosurgery

## 2019-07-21 DIAGNOSIS — M5440 Lumbago with sciatica, unspecified side: Secondary | ICD-10-CM

## 2019-08-03 ENCOUNTER — Inpatient Hospital Stay: Admission: RE | Admit: 2019-08-03 | Payer: Medicare Other | Source: Ambulatory Visit

## 2019-08-09 ENCOUNTER — Ambulatory Visit
Admission: RE | Admit: 2019-08-09 | Discharge: 2019-08-09 | Disposition: A | Discharge status: Home / Self Care | Payer: Medicare Other | Source: Ambulatory Visit | Attending: Neurosurgery | Admitting: Neurosurgery

## 2019-08-09 DIAGNOSIS — M5440 Lumbago with sciatica, unspecified side: Secondary | ICD-10-CM

## 2019-08-09 DIAGNOSIS — M5416 Radiculopathy, lumbar region: Secondary | ICD-10-CM | POA: Diagnosis not present

## 2019-08-09 MED ORDER — METHYLPREDNISOLONE ACETATE 40 MG/ML INJ SUSP (RADIOLOG
120.0000 mg | Freq: Once | INTRAMUSCULAR | Status: AC
  Administered 2019-08-09: 120 mg via EPIDURAL

## 2019-08-09 MED ORDER — IOPAMIDOL (ISOVUE-M 200) INJECTION 41%
1.0000 mL | Freq: Once | INTRAMUSCULAR | Status: AC
  Administered 2019-08-09: 1 mL via EPIDURAL

## 2019-08-09 NOTE — Discharge Instructions (Signed)

## 2019-08-10 ENCOUNTER — Other Ambulatory Visit: Payer: Medicare Other

## 2019-08-26 DIAGNOSIS — N390 Urinary tract infection, site not specified: Secondary | ICD-10-CM | POA: Diagnosis not present

## 2019-08-30 DIAGNOSIS — H90A21 Sensorineural hearing loss, unilateral, right ear, with restricted hearing on the contralateral side: Secondary | ICD-10-CM | POA: Diagnosis not present

## 2019-09-06 DIAGNOSIS — R300 Dysuria: Secondary | ICD-10-CM | POA: Diagnosis not present

## 2019-09-09 DIAGNOSIS — Z23 Encounter for immunization: Secondary | ICD-10-CM | POA: Diagnosis not present

## 2019-09-09 DIAGNOSIS — Z0000 Encounter for general adult medical examination without abnormal findings: Secondary | ICD-10-CM | POA: Diagnosis not present

## 2019-09-09 DIAGNOSIS — I10 Essential (primary) hypertension: Secondary | ICD-10-CM | POA: Diagnosis not present

## 2019-09-09 DIAGNOSIS — K219 Gastro-esophageal reflux disease without esophagitis: Secondary | ICD-10-CM | POA: Diagnosis not present

## 2019-09-09 DIAGNOSIS — E7800 Pure hypercholesterolemia, unspecified: Secondary | ICD-10-CM | POA: Diagnosis not present

## 2019-09-09 DIAGNOSIS — Z1389 Encounter for screening for other disorder: Secondary | ICD-10-CM | POA: Diagnosis not present

## 2019-09-14 DIAGNOSIS — H6121 Impacted cerumen, right ear: Secondary | ICD-10-CM | POA: Diagnosis not present

## 2019-09-14 DIAGNOSIS — H9193 Unspecified hearing loss, bilateral: Secondary | ICD-10-CM | POA: Diagnosis not present

## 2019-10-05 ENCOUNTER — Other Ambulatory Visit: Payer: Self-pay

## 2019-10-05 ENCOUNTER — Ambulatory Visit (INDEPENDENT_AMBULATORY_CARE_PROVIDER_SITE_OTHER): Payer: Medicare Other | Admitting: Otolaryngology

## 2019-10-05 VITALS — Temp 97.3°F

## 2019-10-05 DIAGNOSIS — H6123 Impacted cerumen, bilateral: Secondary | ICD-10-CM

## 2019-10-05 DIAGNOSIS — H903 Sensorineural hearing loss, bilateral: Secondary | ICD-10-CM | POA: Diagnosis not present

## 2019-10-05 NOTE — Progress Notes (Signed)
HPI: Rhonda Watkins is a 86 y.o. female who presents for evaluation of ears.  Someone in her PCPs office was cleaning her ears and caused some bleeding in the right ear.  She wanted to the ear canal and TM checked.  She wears a hearing aid in her right ear..  Past Medical History:  Diagnosis Date  . Arthritis   . Asthma   . GERD (gastroesophageal reflux disease)   . Hyperlipidemia   . Hypertension   . Mitral regurgitation   . Shortness of breath dyspnea    occ  . UTI (lower urinary tract infection)   . Vitamin D deficiency    Past Surgical History:  Procedure Laterality Date  . BACK SURGERY  85,04  . BLADDER REPAIR  95,05  . BUNIONECTOMY Left 15  . cholecyctectomy    . CHOLECYSTECTOMY  80  . COLON SURGERY  05  . EXPLORATION MIDDLE EAR Left    vertigo- no hearing now- 86 yrs old  . HERNIA REPAIR  06,08,11  . KNEE ARTHROSCOPY     lft 02 rt 09   Social History   Socioeconomic History  . Marital status: Married    Spouse name: Not on file  . Number of children: Not on file  . Years of education: Not on file  . Highest education level: Not on file  Occupational History  . Not on file  Tobacco Use  . Smoking status: Former Smoker    Quit date: 09/17/1958    Years since quitting: 61.0  . Smokeless tobacco: Never Used  Substance and Sexual Activity  . Alcohol use: Yes    Comment: occ  . Drug use: No  . Sexual activity: Not on file  Other Topics Concern  . Not on file  Social History Narrative  . Not on file   Social Determinants of Health   Financial Resource Strain:   . Difficulty of Paying Living Expenses: Not on file  Food Insecurity:   . Worried About Charity fundraiser in the Last Year: Not on file  . Ran Out of Food in the Last Year: Not on file  Transportation Needs:   . Lack of Transportation (Medical): Not on file  . Lack of Transportation (Non-Medical): Not on file  Physical Activity:   . Days of Exercise per Week: Not on file  . Minutes of Exercise  per Session: Not on file  Stress:   . Feeling of Stress : Not on file  Social Connections:   . Frequency of Communication with Friends and Family: Not on file  . Frequency of Social Gatherings with Friends and Family: Not on file  . Attends Religious Services: Not on file  . Active Member of Clubs or Organizations: Not on file  . Attends Archivist Meetings: Not on file  . Marital Status: Not on file   Family History  Problem Relation Age of Onset  . Cancer Mother   . Heart attack Father   . Coronary artery disease Father    No Known Allergies Prior to Admission medications   Medication Sig Start Date End Date Taking? Authorizing Provider  albuterol (PROAIR HFA) 108 (90 Base) MCG/ACT inhaler USE 2 PUFFS EVERY 4 TO 6 HOURS AS NEEDED FOR COUGH/WHEEZING. 11/15/18  Yes Kozlow, Donnamarie Poag, MD  aspirin 81 MG tablet Take 81 mg by mouth daily.   Yes [provider]  atorvastatin (LIPITOR) 10 MG tablet  12/27/17  Yes [provider]  Cholecalciferol (  VITAMIN D PO) Take 3,000 Units by mouth daily.    Yes [provider]  diltiazem (CARDIZEM CD) 180 MG 24 hr capsule  12/28/17  Yes [provider]  diltiazem (DILACOR XR) 180 MG 24 hr capsule Take 180 mg by mouth daily.   Yes [provider]  famotidine (PEPCID) 20 MG tablet  09/07/18  Yes [provider]  famotidine (PEPCID) 40 MG tablet TAKE ONE TABLET AT BEDTIME. 03/14/19  Yes Kozlow, Alvira Philips, MD  Fluticasone Furoate (ARNUITY ELLIPTA) 100 MCG/ACT AEPB Inhale 1 puff into the lungs daily. 12/20/18  Yes Kozlow, Alvira Philips, MD  furosemide (LASIX) 20 MG tablet Take 20 mg by mouth daily.   Yes [provider]  furosemide (LASIX) 40 MG tablet Take 40 mg by mouth every other day. 08/31/16  Yes [provider]  mometasone (ASMANEX, 30 METERED DOSES,) 220 MCG/INH inhaler one inhalation 1-2 time per day 06/01/19  Yes Kozlow, Alvira Philips, MD  mometasone (ASMANEX, 60 METERED DOSES,) 220 MCG/INH  inhaler Inhale 1-2 puffs into the lungs daily. 06/16/19  Yes Kozlow, Alvira Philips, MD  Multiple Vitamins-Minerals (MULTI COMPLETE PO) Take 1 tablet by mouth daily.    Yes [provider]  omeprazole (PRILOSEC) 20 MG capsule Take 1 capsule (20 mg total) by mouth daily. 03/23/18  Yes Kozlow, Alvira Philips, MD  Potassium Chloride CR (MICRO-K) 8 MEQ CPCR capsule CR Take 8 mEq by mouth daily.    Yes [provider]  telmisartan (MICARDIS) 80 MG tablet Take 80 mg by mouth daily.   Yes [provider]  TRADJENTA 5 MG TABS tablet  11/15/18  Yes [provider]  Vitamins-Lipotropics (LIPO-FLAVONOID PLUS PO) Take 1 capsule by mouth 2 (two) times daily.   Yes [provider]     Positive ROS: Otherwise negative  All other systems have been reviewed and were otherwise negative with the exception of those mentioned in the HPI and as above.  Physical Exam: Constitutional: Alert, well-appearing, no acute distress Ears: External ears without lesions or tenderness. Ear canals she has little bit of wax in the left side that was cleaned with curette and forceps.  Left TM and left ear canal are otherwise clear.  Right ear canal reveals a minimal amount of cerumen that was removed with forceps.  She has a small scab of dried blood on the inferior aspect of the ear canal but this is nonobstructing.  The TM is clear with good mobility on pneumatic otoscopy.. Nasal: External nose without lesions. Clear nasal passages Oral: Oropharynx clear. Neck: No palpable adenopathy or masses Respiratory: Breathing comfortably  Skin: No facial/neck lesions or rash noted.  Cerumen impaction removal  Date/Time: 10/05/2019 12:12 PM Performed by: Drema Halon, MD Authorized by: Drema Halon, MD   Consent:    Consent obtained:  Verbal   Consent given by:  Patient   Risks discussed:  Pain and bleeding Procedure details:    Location:  L ear and R ear   Procedure type: forceps    Post-procedure details:    Inspection:  TM intact and canal normal   Hearing quality:  Improved   Patient tolerance of procedure:  Tolerated well, no immediate complications Comments:     TMs are clear bilaterally.  She has a small scab on the inferior aspect of the right ear canal which is nonobstructing and should not cause any problems.    Assessment: Minimal cerumen buildup. Right ear canal trauma should not cause any  problems.  Plan: She will follow up here as needed any further wax problems.  Narda Bonds, MD

## 2019-10-29 ENCOUNTER — Other Ambulatory Visit: Payer: Self-pay | Admitting: Allergy and Immunology

## 2019-11-14 ENCOUNTER — Other Ambulatory Visit: Payer: Self-pay | Admitting: Neurosurgery

## 2019-11-14 DIAGNOSIS — M5440 Lumbago with sciatica, unspecified side: Secondary | ICD-10-CM

## 2019-11-16 ENCOUNTER — Ambulatory Visit
Admission: RE | Admit: 2019-11-16 | Discharge: 2019-11-16 | Disposition: A | Discharge status: Home / Self Care | Payer: Medicare Other | Source: Ambulatory Visit | Attending: Neurosurgery | Admitting: Neurosurgery

## 2019-11-16 ENCOUNTER — Other Ambulatory Visit: Payer: Self-pay

## 2019-11-16 DIAGNOSIS — M545 Low back pain: Secondary | ICD-10-CM | POA: Diagnosis not present

## 2019-11-16 DIAGNOSIS — M5440 Lumbago with sciatica, unspecified side: Secondary | ICD-10-CM

## 2019-11-16 MED ORDER — IOPAMIDOL (ISOVUE-M 200) INJECTION 41%
1.0000 mL | Freq: Once | INTRAMUSCULAR | Status: AC
  Administered 2019-11-16: 13:00:00 1 mL via EPIDURAL

## 2019-11-16 MED ORDER — METHYLPREDNISOLONE ACETATE 40 MG/ML INJ SUSP (RADIOLOG
120.0000 mg | Freq: Once | INTRAMUSCULAR | Status: AC
  Administered 2019-11-16: 120 mg via EPIDURAL

## 2019-11-16 NOTE — Discharge Instructions (Signed)

## 2020-01-17 ENCOUNTER — Ambulatory Visit: Payer: Medicare Other | Admitting: Allergy and Immunology

## 2020-01-17 ENCOUNTER — Other Ambulatory Visit: Payer: Self-pay

## 2020-01-17 ENCOUNTER — Encounter: Payer: Self-pay | Admitting: Allergy and Immunology

## 2020-01-17 VITALS — BP 118/60 | HR 70 | Resp 18 | Ht 62.5 in | Wt 137.1 lb

## 2020-01-17 DIAGNOSIS — J3089 Other allergic rhinitis: Secondary | ICD-10-CM

## 2020-01-17 DIAGNOSIS — K219 Gastro-esophageal reflux disease without esophagitis: Secondary | ICD-10-CM | POA: Diagnosis not present

## 2020-01-17 DIAGNOSIS — J4530 Mild persistent asthma, uncomplicated: Secondary | ICD-10-CM | POA: Diagnosis not present

## 2020-01-17 MED ORDER — ALBUTEROL SULFATE HFA 108 (90 BASE) MCG/ACT IN AERS: INHALATION_SPRAY | RESPIRATORY_TRACT | 1 refills | Status: DC

## 2020-01-17 MED ORDER — ASMANEX (60 METERED DOSES) 220 MCG/INH IN AEPB: 1.0000 | INHALATION_SPRAY | Freq: Every day | RESPIRATORY_TRACT | 5 refills | Status: DC

## 2020-01-17 MED ORDER — FAMOTIDINE 40 MG PO TABS: 40.0000 mg | ORAL_TABLET | Freq: Every day | ORAL | 2 refills | Status: DC

## 2020-01-17 NOTE — Patient Instructions (Addendum)
°  1. Continue Asmanex 220 1 inhalation 1 time per day  2. Continue Prilosec 20 mg in a.m. + famotidine 40 mg in p.m.  3. Continue Albuterol HFA 2 puffs every 4-6 hours if needed  4. Return to clinic in 12 months or earlier if problem      

## 2020-01-17 NOTE — Progress Notes (Signed)
- [place] [facility] [place] [facility] [place] [facility] [place]   Follow-up Note  Referring Provider: Merlene Laughter, MD Primary Provider: Merlene Laughter, MD Date of Office Visit: 01/17/2020  Subjective:   Rhonda Watkins (DOB: [birth year]) is a 86 y.o. female who [facility] in re-evaluation of the following:  HPI: Rhonda Watkins returns to this clinic in evaluation of asthma and allergic rhinoconjunctivitis and LPR.  Her last visit to this clinic was 11 January 2019.   Overall she has had a wonderful interval of time without the need for systemic steroid or an antibiotic to treat any type of airway issue and she rarely uses a short acting bronchodilator.  She has not been exercising very much because of some musculoskeletal issues and balance issues.  She has had no problems with her nose.  She has had no problems with her reflux while using famotidine on a daily basis and occasionally adding an Prilosec.  She has received 2 Pfizer Covid vaccinations.  Her husband has been ill lately with musculoskeletal issues and blood clots and she has been invested heavily in time and effort in the care of her husband recently  Allergies as of 01/17/2020   No Known Allergies     Medication List      albuterol 108 (90 Base) MCG/ACT inhaler Commonly known as: ProAir HFA USE 2 PUFFS EVERY 4 TO 6 HOURS AS NEEDED FOR COUGH/WHEEZING.   ascorbic acid 1000 MG tablet Commonly known as: VITAMIN C Take by mouth.   Asmanex (60 Metered Doses) 220 MCG/INH inhaler Generic drug: mometasone   aspirin 81 MG tablet Take 81 mg by mouth daily.   atorvastatin 10 MG tablet Commonly known as: LIPITOR   diltiazem 180 MG 24 hr capsule Commonly known as: CARDIZEM CD   diltiazem 180 MG 24 hr capsule Commonly known as: DILACOR XR Take 180 mg by mouth daily.   famotidine 40 MG tablet Commonly known as: PEPCID Take 1 tablet (40 mg total) by mouth at bedtime.   Fluticasone  Furoate 100 MCG/ACT Aepb Commonly known as: Arnuity Ellipta Inhale 1 puff into the lungs daily.   furosemide 20 MG tablet Commonly known as: LASIX Take 20 mg by mouth daily.   furosemide 40 MG tablet Commonly known as: LASIX Take 40 mg by mouth every other day.   LIPO-FLAVONOID PLUS PO Take 1 capsule by mouth 2 (two) times daily.   Micro-K 8 MEQ Cpcr capsule CR Generic drug: Potassium Chloride CR Take 8 mEq by mouth daily.   MULTI COMPLETE PO Take 1 tablet by mouth daily.   omeprazole 20 MG capsule Commonly known as: PRILOSEC TAKE 1 CAPSULE DAILY.   telmisartan 80 MG tablet Commonly known as: MICARDIS Take 80 mg by mouth daily.   Tradjenta 5 MG Tabs tablet Generic drug: linagliptin   VITAMIN D PO Take 3,000 Units by mouth daily.       Past Medical History:  Diagnosis Date  . Arthritis   . Asthma   . GERD (gastroesophageal reflux disease)   . Hyperlipidemia   . Hypertension   . Mitral regurgitation   . Shortness of breath dyspnea    occ  . UTI (lower urinary tract infection)   . Vitamin D deficiency     Past Surgical History:  Procedure Laterality Date  . BACK SURGERY  85,04  . BLADDER REPAIR  95,05  . BUNIONECTOMY Left 15  . cholecyctectomy    . CHOLECYSTECTOMY  80  . COLON SURGERY  05  . EXPLORATION MIDDLE EAR Left    vertigo- no hearing now- 86 yrs old  . HERNIA REPAIR  06,08,11  . KNEE ARTHROSCOPY     lft 02 rt 09    Review of systems negative except as noted in HPI / PMHx or noted below:  Review of Systems  Constitutional: Negative.   HENT: Negative.   Eyes: Negative.   Respiratory: Negative.   Cardiovascular: Negative.   Gastrointestinal: Negative.   Genitourinary: Negative.   Musculoskeletal: Negative.   Skin: Negative.   Neurological: Negative.   Endo/Heme/Allergies: Negative.   Psychiatric/Behavioral: Negative.      Objective:   Vitals:   01/17/20 1123  BP: 118/60  Pulse: 70  Resp: 18  SpO2: 97%   Height: 5' 2.5"  (158.8 cm)  Weight: 137 lb 1 oz (62.2 kg)   Physical Exam Constitutional:      Appearance: She is not diaphoretic.  HENT:     Head: Normocephalic.     Right Ear: Ear canal and external ear normal.     Left Ear: Ear canal and external ear normal.     Nose: Nose normal. No mucosal edema or rhinorrhea.     Mouth/Throat:     Pharynx: Uvula midline. No oropharyngeal exudate.  Eyes:     Conjunctiva/sclera: Conjunctivae normal.  Neck:     Thyroid: No thyromegaly.     Trachea: Trachea normal. No tracheal tenderness or tracheal deviation.  Cardiovascular:     Rate and Rhythm: Normal rate and regular rhythm.     Heart sounds: Normal heart sounds, S1 normal and S2 normal. No murmur.  Pulmonary:     Effort: No respiratory distress.     Breath sounds: Normal breath sounds. No stridor. No wheezing or rales.  Lymphadenopathy:     Head:     Right side of head: No tonsillar adenopathy.     Left side of head: No tonsillar adenopathy.     Cervical: No cervical adenopathy.  Skin:    Findings: No erythema or rash.     Nails: There is no clubbing.  Neurological:     Mental Status: She is alert.     Diagnostics:    Spirometry was performed and demonstrated an FEV1 of 2.15 at 138 % of predicted.  The patient had an Asthma Control Test with the following results: ACT Total Score: 25.    Assessment and Plan:   1. Asthma, well controlled, mild persistent   2. Other allergic rhinitis   3. LPRD (laryngopharyngeal reflux disease)     1. Continue Asmanex 220 1 inhalation 1 time per day  2. Continue Prilosec 20 mg in a.m. + famotidine 40 mg in p.m.  3. Continue Albuterol HFA 2 puffs every 4-6 hours if needed  4. Return to clinic in 12 months or earlier if problem  This has been an excellent interval of time regarding Rhonda Watkins's respiratory tract issue on her current medical therapy and I see no reason to change this therapy at this point in time.  Assuming she does well with this plan I will  see her back in this clinic in 1 year or earlier if there is a problem.  Allena Katz, MD Allergy / Immunology [place]

## 2020-01-18 ENCOUNTER — Encounter: Payer: Self-pay | Admitting: Allergy and Immunology

## 2020-01-24 DIAGNOSIS — I10 Essential (primary) hypertension: Secondary | ICD-10-CM | POA: Diagnosis not present

## 2020-01-24 DIAGNOSIS — R5383 Other fatigue: Secondary | ICD-10-CM | POA: Diagnosis not present

## 2020-01-24 DIAGNOSIS — E1169 Type 2 diabetes mellitus with other specified complication: Secondary | ICD-10-CM | POA: Diagnosis not present

## 2020-01-24 DIAGNOSIS — E7800 Pure hypercholesterolemia, unspecified: Secondary | ICD-10-CM | POA: Diagnosis not present

## 2020-01-25 DIAGNOSIS — N3000 Acute cystitis without hematuria: Secondary | ICD-10-CM | POA: Diagnosis not present

## 2020-01-25 DIAGNOSIS — E1165 Type 2 diabetes mellitus with hyperglycemia: Secondary | ICD-10-CM | POA: Diagnosis not present

## 2020-01-25 DIAGNOSIS — Z794 Long term (current) use of insulin: Secondary | ICD-10-CM | POA: Diagnosis not present

## 2020-01-25 DIAGNOSIS — D72829 Elevated white blood cell count, unspecified: Secondary | ICD-10-CM | POA: Diagnosis not present

## 2020-01-26 DIAGNOSIS — N390 Urinary tract infection, site not specified: Secondary | ICD-10-CM | POA: Diagnosis not present

## 2020-01-26 DIAGNOSIS — E1165 Type 2 diabetes mellitus with hyperglycemia: Secondary | ICD-10-CM | POA: Diagnosis not present

## 2020-01-31 DIAGNOSIS — H52223 Regular astigmatism, bilateral: Secondary | ICD-10-CM | POA: Diagnosis not present

## 2020-02-06 DIAGNOSIS — H52223 Regular astigmatism, bilateral: Secondary | ICD-10-CM | POA: Diagnosis not present

## 2020-02-10 ENCOUNTER — Other Ambulatory Visit: Payer: Self-pay

## 2020-02-10 ENCOUNTER — Ambulatory Visit (HOSPITAL_COMMUNITY)
Admission: RE | Admit: 2020-02-10 | Discharge: 2020-02-10 | Disposition: A | Discharge status: Home / Self Care | Payer: Medicare Other | Source: Ambulatory Visit | Attending: Cardiology | Admitting: Cardiology

## 2020-02-10 ENCOUNTER — Other Ambulatory Visit (HOSPITAL_COMMUNITY): Payer: Self-pay | Admitting: Internal Medicine

## 2020-02-10 DIAGNOSIS — R600 Localized edema: Secondary | ICD-10-CM | POA: Diagnosis not present

## 2020-02-10 DIAGNOSIS — E1169 Type 2 diabetes mellitus with other specified complication: Secondary | ICD-10-CM | POA: Diagnosis not present

## 2020-02-10 DIAGNOSIS — Z87440 Personal history of urinary (tract) infections: Secondary | ICD-10-CM | POA: Diagnosis not present

## 2020-03-06 DIAGNOSIS — E1169 Type 2 diabetes mellitus with other specified complication: Secondary | ICD-10-CM | POA: Diagnosis not present

## 2020-03-06 DIAGNOSIS — E1165 Type 2 diabetes mellitus with hyperglycemia: Secondary | ICD-10-CM | POA: Diagnosis not present

## 2020-03-06 DIAGNOSIS — I10 Essential (primary) hypertension: Secondary | ICD-10-CM | POA: Diagnosis not present

## 2020-03-06 DIAGNOSIS — Z794 Long term (current) use of insulin: Secondary | ICD-10-CM | POA: Diagnosis not present

## 2020-03-09 DIAGNOSIS — Z6825 Body mass index (BMI) 25.0-25.9, adult: Secondary | ICD-10-CM | POA: Insufficient documentation

## 2020-03-09 DIAGNOSIS — M5440 Lumbago with sciatica, unspecified side: Secondary | ICD-10-CM | POA: Diagnosis not present

## 2020-03-16 DIAGNOSIS — E1169 Type 2 diabetes mellitus with other specified complication: Secondary | ICD-10-CM | POA: Diagnosis not present

## 2020-03-16 DIAGNOSIS — Z7984 Long term (current) use of oral hypoglycemic drugs: Secondary | ICD-10-CM | POA: Diagnosis not present

## 2020-03-16 DIAGNOSIS — M25569 Pain in unspecified knee: Secondary | ICD-10-CM | POA: Diagnosis not present

## 2020-03-19 ENCOUNTER — Other Ambulatory Visit: Payer: Self-pay

## 2020-03-19 ENCOUNTER — Encounter: Payer: Medicare Other | Attending: Internal Medicine | Admitting: Dietician

## 2020-03-19 DIAGNOSIS — E118 Type 2 diabetes mellitus with unspecified complications: Secondary | ICD-10-CM | POA: Diagnosis not present

## 2020-03-19 DIAGNOSIS — IMO0002 Reserved for concepts with insufficient information to code with codable children: Secondary | ICD-10-CM

## 2020-03-19 DIAGNOSIS — E1165 Type 2 diabetes mellitus with hyperglycemia: Secondary | ICD-10-CM

## 2020-03-19 NOTE — Progress Notes (Signed)
Patient is here today alone.  She reports that she was newly diagnosed with diabetes 3 weeks ago. She reports a 40 lbs weight loss since March (174 lbs to 134 lbs and increased to 138 lbs now).  History includes:  Type 2 Diabetes, GERD, HLD, HTN, asthma, back issues, vitamin D deficiency.  She gets periodic steroid injections in her back (last dose in January 2021). She also has increased stomach problems with most recent concern of constipation.  She does not tolerate rice due to constipation.  She does not tolerate increased fruit and raw vegetables due to diarrhea.  A1C 13.3% 01/24/2020 Medications include:  Basaglar 12 units q HS and Januvia.  Patient is concerned that she is having increased fatigue on the Januvia.  Discussed for her to speak with her mD>  Patient lives with her husband.  He does the driving and she shops and cooks.  He has been in the hospital 5 times in the past year.  This has caused her increased amounts of stress.  She has 5 supportive children. They moved from [place]. Patient states that she has never exercised but has always stayed busy.  Diabetes Self-Management Education  Visit Type: First/Initial  Appt. Start Time: 1110 Appt. End Time: 1220  03/19/2020  [name], identified by name and date of birth, is a 86 y.o. female with a diagnosis of Diabetes: Type 2.   ASSESSMENT  Height 5' 2.5" (1.588 m), weight 138 lb (62.6 kg). Body mass index is 24.84 kg/m.   Diabetes Self-Management Education - 03/19/20 1520      Visit Information   Visit Type First/Initial      Initial Visit   Diabetes Type Type 2    Are you currently following a meal plan? Yes    What type of meal plan do you follow? low sugar less bread    Are you taking your medications as prescribed? Yes    Date Diagnosed 01/2020      Psychosocial Assessment   Patient Belief/Attitude about Diabetes Motivated to manage diabetes    Self-care barriers Hard of hearing    Self-management support  Doctor's office;Family    Other persons present Patient    Patient Concerns Nutrition/Meal planning;Glycemic Control    Special Needs None    Learning Readiness Ready    How often do you need to have someone help you when you read instructions, pamphlets, or other written materials from your doctor or pharmacy? 1 - Never    What is the last grade level you completed in school? college      Pre-Education Assessment   Patient understands the diabetes disease and treatment process. Needs Instruction    Patient understands incorporating nutritional management into lifestyle. Needs Instruction    Patient undertands incorporating physical activity into lifestyle. Needs Instruction    Patient understands using medications safely. Needs Instruction    Patient understands monitoring blood glucose, interpreting and using results Needs Instruction    Patient understands prevention, detection, and treatment of acute complications. Needs Instruction    Patient understands prevention, detection, and treatment of chronic complications. Needs Instruction    Patient understands how to develop strategies to address psychosocial issues. Needs Instruction    Patient understands how to develop strategies to promote health/change behavior. Needs Instruction      Complications   Last HgB A1C per patient/outside source 13.3 %   01/24/2020   How often do you check your blood sugar? 1-2 times/day    Fasting  Blood glucose range (mg/dL) 07-371;062-694    Postprandial Blood glucose range (mg/dL) 854-627;035-009;>381    Number of hypoglycemic episodes per month 0    Number of hyperglycemic episodes per week 3    Can you tell when your blood sugar is high? No    Have you had a dilated eye exam in the past 12 months? Yes    Have you had a dental exam in the past 12 months? Yes    Are you checking your feet? No      Dietary Intake   Breakfast toast with butter and sugar free jelly, occasional egg, coffee with splenda  and half and half    Lunch sandwich or [employer] or leftovers   eats out a lot.  often her largest meal   Dinner meat, potatoes or pasta, vegetables OR soup and crackers    Snack (evening) milk and occasional cookies    Beverage(s) water, milk, unsweetened tea with splenda, 2 oz juice diluted with water, milk   used to be hooked on [employer]     Exercise   Exercise Type ADL's   Patient has never exercised.   How many days per week to you exercise? 0    How many minutes per day do you exercise? 0    Total minutes per week of exercise 0      Patient Education   Previous Diabetes Education No    Disease state  Definition of diabetes, type 1 and 2, and the diagnosis of diabetes;Factors that contribute to the development of diabetes    Nutrition management  Role of diet in the treatment of diabetes and the relationship between the three main macronutrients and blood glucose level;Food label reading, portion sizes and measuring food.;Meal options for control of blood glucose level and chronic complications.;Information on hints to eating out and maintain blood glucose control.    Physical activity and exercise  Role of exercise on diabetes management, blood pressure control and cardiac health.;Helped patient identify appropriate exercises in relation to his/her diabetes, diabetes complications and other health issue.    Medications Reviewed patients medication for diabetes, action, purpose, timing of dose and side effects.;Taught/reviewed insulin injection, site rotation, insulin storage and needle disposal.    Monitoring Purpose and frequency of SMBG.;Identified appropriate SMBG and/or A1C goals.    Acute complications Taught treatment of hypoglycemia - the 15 rule.    Chronic complications Relationship between chronic complications and blood glucose control;Dental care;Assessed and discussed foot care and prevention of foot problems    Psychosocial adjustment Role of stress on diabetes;Worked with  patient to identify barriers to care and solutions;Identified and addressed patients feelings and concerns about diabetes      Individualized Goals (developed by patient)   Nutrition General guidelines for healthy choices and portions discussed    Physical Activity Not Applicable    Medications take my medication as prescribed    Monitoring  test my blood glucose as discussed    Health Coping discuss diabetes with (comment)      Post-Education Assessment   Patient understands the diabetes disease and treatment process. Demonstrates understanding / competency    Patient understands incorporating nutritional management into lifestyle. Needs Review    Patient undertands incorporating physical activity into lifestyle. Demonstrates understanding / competency    Patient understands using medications safely. Demonstrates understanding / competency    Patient understands monitoring blood glucose, interpreting and using results Demonstrates understanding / competency    Patient understands prevention, detection, and treatment  of acute complications. Demonstrates understanding / competency    Patient understands prevention, detection, and treatment of chronic complications. Demonstrates understanding / competency    Patient understands how to develop strategies to address psychosocial issues. Demonstrates understanding / competency    Patient understands how to develop strategies to promote health/change behavior. Demonstrates understanding / competency      Outcomes   Expected Outcomes Demonstrated interest in learning. Expect positive outcomes    Future DMSE PRN    Program Status Completed           Individualized Plan for Diabetes Self-Management Training:   Learning Objective:  Patient will have a greater understanding of diabetes self-management. Patient education plan is to attend individual and/or group sessions per assessed needs and concerns.   Plan:   Patient Instructions    Insulin should be injected into your fat.  This can be on the back of the arm, stomach, top of hip, buttocks, or thighs.  Be sure to rotate your insulin injection sites. Be sure to take your medication as prescribed. Discuss any concerns with your MD.  Signs of low blood sugar are shakey, sweaty, confusion, and others.  If you feel these symptoms, check your blood sugar.  If it is above 70 have a meal or snack.  If it is less than 70, treat with 1/2 cup regular juice or soda or 3-4 glucose tabs or 5-6 hard candies (chew these).  If you are having a lot of numbers higher than 250 discuss with your MD. Stay hydrated and drink plenty of water.   Spread your carbohydrates throughout the day.  Choose a protein with each meal and snack.  Aim for 3 carbohydrate choices with each meal and 1 for snacks.  (See your yellow card for choices and portion sizes.)     Expected Outcomes:  Demonstrated interest in learning. Expect positive outcomes  Education material provided: ADA - How to Thrive: A Guide for Your Journey with Diabetes, Meal plan card and Snack sheet  If problems or questions, patient to contact team via:  Phone  Future DSME appointment: PRN

## 2020-03-19 NOTE — Patient Instructions (Signed)
Insulin should be injected into your fat.  This can be on the back of the arm, stomach, top of hip, buttocks, or thighs.  Be sure to rotate your insulin injection sites. Be sure to take your medication as prescribed. Discuss any concerns with your MD.  Signs of low blood sugar are shakey, sweaty, confusion, and others.  If you feel these symptoms, check your blood sugar.  If it is above 70 have a meal or snack.  If it is less than 70, treat with 1/2 cup regular juice or soda or 3-4 glucose tabs or 5-6 hard candies (chew these).  If you are having a lot of numbers higher than 250 discuss with your MD. Stay hydrated and drink plenty of water.   Spread your carbohydrates throughout the day.  Choose a protein with each meal and snack.  Aim for 3 carbohydrate choices with each meal and 1 for snacks.  (See your yellow card for choices and portion sizes.)

## 2020-03-30 DIAGNOSIS — I10 Essential (primary) hypertension: Secondary | ICD-10-CM | POA: Diagnosis not present

## 2020-03-30 DIAGNOSIS — E1169 Type 2 diabetes mellitus with other specified complication: Secondary | ICD-10-CM | POA: Diagnosis not present

## 2020-03-30 DIAGNOSIS — M25562 Pain in left knee: Secondary | ICD-10-CM | POA: Diagnosis not present

## 2020-03-30 DIAGNOSIS — M25561 Pain in right knee: Secondary | ICD-10-CM | POA: Diagnosis not present

## 2020-04-05 DIAGNOSIS — N390 Urinary tract infection, site not specified: Secondary | ICD-10-CM | POA: Diagnosis not present

## 2020-04-19 DIAGNOSIS — M170 Bilateral primary osteoarthritis of knee: Secondary | ICD-10-CM | POA: Diagnosis not present

## 2020-05-07 DIAGNOSIS — E1165 Type 2 diabetes mellitus with hyperglycemia: Secondary | ICD-10-CM | POA: Diagnosis not present

## 2020-05-07 DIAGNOSIS — E1169 Type 2 diabetes mellitus with other specified complication: Secondary | ICD-10-CM | POA: Diagnosis not present

## 2020-05-07 DIAGNOSIS — R1314 Dysphagia, pharyngoesophageal phase: Secondary | ICD-10-CM | POA: Diagnosis not present

## 2020-05-07 DIAGNOSIS — I10 Essential (primary) hypertension: Secondary | ICD-10-CM | POA: Diagnosis not present

## 2020-05-15 ENCOUNTER — Emergency Department (HOSPITAL_COMMUNITY): Payer: Medicare Other

## 2020-05-15 ENCOUNTER — Other Ambulatory Visit: Payer: Self-pay

## 2020-05-15 ENCOUNTER — Encounter (HOSPITAL_COMMUNITY): Payer: Self-pay | Admitting: Emergency Medicine

## 2020-05-15 ENCOUNTER — Emergency Department (HOSPITAL_COMMUNITY)
Admission: EM | Admit: 2020-05-15 | Discharge: 2020-05-15 | Disposition: A | Discharge status: Home / Self Care | Payer: Medicare Other | Attending: Emergency Medicine | Admitting: Emergency Medicine

## 2020-05-15 DIAGNOSIS — Z7984 Long term (current) use of oral hypoglycemic drugs: Secondary | ICD-10-CM | POA: Insufficient documentation

## 2020-05-15 DIAGNOSIS — K219 Gastro-esophageal reflux disease without esophagitis: Secondary | ICD-10-CM | POA: Diagnosis not present

## 2020-05-15 DIAGNOSIS — Z87891 Personal history of nicotine dependence: Secondary | ICD-10-CM | POA: Insufficient documentation

## 2020-05-15 DIAGNOSIS — R109 Unspecified abdominal pain: Secondary | ICD-10-CM

## 2020-05-15 DIAGNOSIS — N390 Urinary tract infection, site not specified: Secondary | ICD-10-CM

## 2020-05-15 DIAGNOSIS — R1110 Vomiting, unspecified: Secondary | ICD-10-CM | POA: Insufficient documentation

## 2020-05-15 DIAGNOSIS — N281 Cyst of kidney, acquired: Secondary | ICD-10-CM | POA: Diagnosis not present

## 2020-05-15 DIAGNOSIS — K5730 Diverticulosis of large intestine without perforation or abscess without bleeding: Secondary | ICD-10-CM | POA: Diagnosis not present

## 2020-05-15 DIAGNOSIS — Z79899 Other long term (current) drug therapy: Secondary | ICD-10-CM | POA: Insufficient documentation

## 2020-05-15 DIAGNOSIS — K8689 Other specified diseases of pancreas: Secondary | ICD-10-CM | POA: Diagnosis not present

## 2020-05-15 DIAGNOSIS — R140 Abdominal distension (gaseous): Secondary | ICD-10-CM | POA: Diagnosis not present

## 2020-05-15 DIAGNOSIS — R52 Pain, unspecified: Secondary | ICD-10-CM | POA: Diagnosis not present

## 2020-05-15 DIAGNOSIS — K5900 Constipation, unspecified: Secondary | ICD-10-CM | POA: Insufficient documentation

## 2020-05-15 DIAGNOSIS — I10 Essential (primary) hypertension: Secondary | ICD-10-CM | POA: Diagnosis not present

## 2020-05-15 DIAGNOSIS — Z7982 Long term (current) use of aspirin: Secondary | ICD-10-CM | POA: Insufficient documentation

## 2020-05-15 DIAGNOSIS — G4489 Other headache syndrome: Secondary | ICD-10-CM | POA: Diagnosis not present

## 2020-05-15 DIAGNOSIS — K7689 Other specified diseases of liver: Secondary | ICD-10-CM | POA: Diagnosis not present

## 2020-05-15 DIAGNOSIS — R1032 Left lower quadrant pain: Secondary | ICD-10-CM | POA: Diagnosis not present

## 2020-05-15 DIAGNOSIS — J45909 Unspecified asthma, uncomplicated: Secondary | ICD-10-CM | POA: Diagnosis not present

## 2020-05-15 DIAGNOSIS — R1084 Generalized abdominal pain: Secondary | ICD-10-CM | POA: Diagnosis not present

## 2020-05-15 LAB — URINALYSIS, ROUTINE W REFLEX MICROSCOPIC
Bilirubin Urine: NEGATIVE
Glucose, UA: NEGATIVE mg/dL
Hgb urine dipstick: NEGATIVE
Ketones, ur: 5 mg/dL — AB
Nitrite: NEGATIVE
Protein, ur: NEGATIVE mg/dL
Specific Gravity, Urine: 1.014 (ref 1.005–1.030)
WBC, UA: >50 WBC/hpf — ABNORMAL HIGH (ref 0–5)
pH: 5 (ref 5.0–8.0)

## 2020-05-15 LAB — CBC
HCT: 44.7 % (ref 36.0–46.0)
Hemoglobin: 14.6 g/dL (ref 12.0–15.0)
MCH: 30.2 pg (ref 26.0–34.0)
MCHC: 32.7 g/dL (ref 30.0–36.0)
MCV: 92.5 fL (ref 80.0–100.0)
Platelets: 215 10*3/uL (ref 150–400)
RBC: 4.83 MIL/uL (ref 3.87–5.11)
RDW: 13.4 % (ref 11.5–15.5)
WBC: 11.3 10*3/uL — ABNORMAL HIGH (ref 4.0–10.5)
nRBC: 0 % (ref 0.0–0.2)

## 2020-05-15 LAB — COMPREHENSIVE METABOLIC PANEL
ALT: 17 U/L (ref 0–44)
AST: 18 U/L (ref 15–41)
Albumin: 3.9 g/dL (ref 3.5–5.0)
Alkaline Phosphatase: 94 U/L (ref 38–126)
Anion gap: 13 (ref 5–15)
BUN: 14 mg/dL (ref 8–23)
CO2: 26 mmol/L (ref 22–32)
Calcium: 9.7 mg/dL (ref 8.9–10.3)
Chloride: 99 mmol/L (ref 98–111)
Creatinine, Ser: 0.77 mg/dL (ref 0.44–1.00)
GFR calc Af Amer: >60 mL/min (ref >60)
GFR calc non Af Amer: >60 mL/min (ref >60)
Glucose, Bld: 196 mg/dL — ABNORMAL HIGH (ref 70–99)
Potassium: 3.3 mmol/L — ABNORMAL LOW (ref 3.5–5.1)
Sodium: 138 mmol/L (ref 135–145)
Total Bilirubin: 0.6 mg/dL (ref 0.3–1.2)
Total Protein: 7.1 g/dL (ref 6.5–8.1)

## 2020-05-15 LAB — LIPASE, BLOOD: Lipase: 27 U/L (ref 11–51)

## 2020-05-15 LAB — CBG MONITORING, ED: Glucose-Capillary: 91 mg/dL (ref 70–99)

## 2020-05-15 MED ORDER — IOHEXOL 300 MG/ML  SOLN
100.0000 mL | Freq: Once | INTRAMUSCULAR | Status: AC | PRN
  Administered 2020-05-15: 100 mL via INTRAVENOUS

## 2020-05-15 MED ORDER — POLYETHYLENE GLYCOL 3350 17 GM/SCOOP PO POWD: ORAL | 0 refills | Status: DC

## 2020-05-15 MED ORDER — CEPHALEXIN 500 MG PO CAPS: 500.0000 mg | ORAL_CAPSULE | Freq: Two times a day (BID) | ORAL | 0 refills | Status: AC

## 2020-05-15 NOTE — ED Triage Notes (Addendum)
Pt transported from home by [facility] for upper abd pain and distention onset yesterday @ 1630 last normal BM yesterday.  VSS, denies n/v/d Pt states used both suppository and drank bottle of mag citrate PTA

## 2020-05-15 NOTE — ED Provider Notes (Signed)
[facility] EMERGENCY DEPARTMENT Provider Note   CSN: [id number] Arrival date & time: 05/15/20  3646     History Chief Complaint  Patient presents with  . Bloated    Rhonda Watkins is a 86 y.o. female presenting for evaluation of abdominal cramping.  Patient states her symptoms began yesterday around 1 PM.  She reports intermittent abdominal cramping, occurring every few minutes.  She called her PCP, who told her to take Tylenol mag citrate.  She vomited about 20 minutes afterwards.  She reports no nausea or vomiting since.  She continued to have intermittent abdominal pain for several hours before resolving without intervention.  She reports her last bowel movement yesterday, she has been having to strain a little bit more than normal, however her BM yesterday was normal.  She denies fevers, chills, chest pain, shortness of breath.  She reports mildly decreased urination.  She reports abdominal pain has almost completely resolved, she is feeling comfortable at this time.  She has not had anything to eat since the pain.  She reports multiple hernia repairs, cholecystectomy, and bladder repair.  No other abdominal surgeries. She was recently started on insulin for DM, no other medication changes.    Additional history taken from chart review.  Patient with a history of asthma, GERD, hypertension, hyperlipidemia, mitral regurg, diabetes  HPI     Past Medical History:  Diagnosis Date  . Arthritis   . Asthma   . GERD (gastroesophageal reflux disease)   . Hyperlipidemia   . Hypertension   . Mitral regurgitation   . Shortness of breath dyspnea    occ  . UTI (lower urinary tract infection)   . Vitamin D deficiency     Patient Active Problem List   Diagnosis Date Noted  . Bilateral hand pain 06/26/2017  . Ganglion cyst of flexor tendon sheath of finger of right hand 06/26/2017  . Primary osteoarthritis of both hands 06/26/2017  . Trigger ring finger of right hand  06/26/2017  . Ingrown right big toenail 05/08/2016  . HNP (herniated nucleus pulposus), lumbar 01/12/2015  . Essential hypertension 09/17/2013  . Mitral regurgitation 09/17/2013  . GERD (gastroesophageal reflux disease) 09/17/2013  . Spinal stenosis 09/17/2013  . Hypercholesterolemia 09/17/2013    Past Surgical History:  Procedure Laterality Date  . BACK SURGERY  85,04  . BLADDER REPAIR  95,05  . BUNIONECTOMY Left 15  . cholecyctectomy    . CHOLECYSTECTOMY  80  . COLON SURGERY  05  . EXPLORATION MIDDLE EAR Left    vertigo- no hearing now- 86 yrs old  . HERNIA REPAIR  06,08,11  . KNEE ARTHROSCOPY     lft 02 rt 09     OB History   No obstetric history on file.     Family History  Problem Relation Age of Onset  . Cancer Mother   . Heart attack Father   . Coronary artery disease Father     Social History   Tobacco Use  . Smoking status: Former Smoker    Quit date: 09/17/1958    Years since quitting: 61.7  . Smokeless tobacco: Never Used  Vaping Use  . Vaping Use: Never used  Substance Use Topics  . Alcohol use: Yes    Comment: occ  . Drug use: No    Home Medications Prior to Admission medications   Medication Sig Start Date End Date Taking? Authorizing Provider  albuterol (PROAIR HFA) 108 (90 Base) MCG/ACT inhaler USE 2 PUFFS  EVERY 4 TO 6 HOURS AS NEEDED FOR COUGH/WHEEZING. 01/17/20   Kozlow, Alvira Philips, MD  ascorbic acid (VITAMIN C) 1000 MG tablet Take by mouth.    [provider]  aspirin 81 MG tablet Take 81 mg by mouth daily.    [provider]  atorvastatin (LIPITOR) 10 MG tablet  12/27/17   [provider]  Biotin 1 MG CAPS Take by mouth.    [provider]  celecoxib (CELEBREX) 200 MG capsule Take 200 mg by mouth 2 (two) times daily.    [provider]  cephALEXin (KEFLEX) 500 MG capsule Take 1 capsule (500 mg total) by mouth 2 (two) times daily for 7 days. 05/15/20 05/22/20  Zeva Leber, PA-C  Cholecalciferol  (VITAMIN D PO) Take 3,000 Units by mouth daily.     [provider]  diltiazem (CARDIZEM CD) 180 MG 24 hr capsule  12/28/17   [provider]  diltiazem (DILACOR XR) 180 MG 24 hr capsule Take 180 mg by mouth daily.    [provider]  docusate sodium (COLACE) 100 MG capsule Take 100 mg by mouth 2 (two) times daily.    [provider]  famotidine (PEPCID) 40 MG tablet Take 1 tablet (40 mg total) by mouth at bedtime. 01/17/20   Kozlow, Alvira Philips, MD  Fluticasone Furoate (ARNUITY ELLIPTA) 100 MCG/ACT AEPB Inhale 1 puff into the lungs daily. 12/20/18   Kozlow, Alvira Philips, MD  furosemide (LASIX) 20 MG tablet Take 20 mg by mouth daily.    [provider]  furosemide (LASIX) 40 MG tablet Take 40 mg by mouth every other day. 08/31/16   [provider]  guaiFENesin (MUCINEX) 600 MG 12 hr tablet Take by mouth 2 (two) times daily as needed.    [provider]  Insulin Glargine (BASAGLAR KWIKPEN) 100 UNIT/ML Inject 12 Units into the skin daily.    [provider]  mometasone (ASMANEX, 60 METERED DOSES,) 220 MCG/INH inhaler Inhale 1-2 puffs into the lungs daily. 01/17/20   Kozlow, Alvira Philips, MD  Multiple Vitamins-Minerals (MULTI COMPLETE PO) Take 1 tablet by mouth daily.     [provider]  omeprazole (PRILOSEC) 20 MG capsule TAKE 1 CAPSULE DAILY. 10/31/19   Kozlow, Alvira Philips, MD  polyethylene glycol powder (MIRALAX) 17 GM/SCOOP powder Use 1-2 caps in 8 oz liquid up to 3 times a day as needed until you are having regular bowel movements. 05/15/20   Glyn Gerads, PA-C  Potassium Chloride CR (MICRO-K) 8 MEQ CPCR capsule CR Take 8 mEq by mouth daily.     [provider]  predniSONE (STERAPRED UNI-PAK 21 TAB) 10 MG (21) TBPK tablet Take by mouth as needed.    [provider]  sitaGLIPtin (JANUVIA) 50 MG tablet Take 50 mg by mouth daily.    [provider]  telmisartan (MICARDIS) 80 MG tablet Take 80 mg by mouth daily.     [provider]  TRADJENTA 5 MG TABS tablet  11/15/18   [provider]  Vitamins-Lipotropics (LIPO-FLAVONOID PLUS PO) Take 1 capsule by mouth 2 (two) times daily.    [provider]    Allergies    Patient has no known allergies.  Review of Systems   Review of Systems  Gastrointestinal: Positive for abdominal pain (resolved), constipation (straining) and vomiting (x1).  All other systems reviewed and are negative.   Physical Exam Updated Vital Signs BP (!) 134/56   Pulse 66   Temp 98.2 F (36.8  C) (Oral)   Resp 16   Ht 5\' 2"  (1.575 m)   Wt 62.6 kg   SpO2 97%   BMI 25.24 kg/m   Physical Exam Vitals and nursing note reviewed.  Constitutional:      General: She is not in acute distress.    Appearance: She is well-developed.     Comments: Resting comfortably in the bed in no acute distress  HENT:     Head: Normocephalic and atraumatic.  Eyes:     Conjunctiva/sclera: Conjunctivae normal.     Pupils: Pupils are equal, round, and reactive to light.  Cardiovascular:     Rate and Rhythm: Normal rate and regular rhythm.     Pulses: Normal pulses.  Pulmonary:     Effort: Pulmonary effort is normal. No respiratory distress.     Breath sounds: Normal breath sounds. No wheezing.  Abdominal:     General: There is no distension.     Palpations: Abdomen is soft. There is no mass.     Tenderness: There is abdominal tenderness. There is no guarding or rebound.     Comments: Mild tenderness palpation of left-sided abdomen, worse in the left lower quadrant.  No rigidity, guarding, distention.  Negative rebound.  No peritonitis.  No CVA tenderness.  Musculoskeletal:        General: Normal range of motion.     Cervical back: Normal range of motion and neck supple.  Skin:    General: Skin is warm and dry.     Capillary Refill: Capillary refill takes less than 2 seconds.  Neurological:     Mental Status: She is alert and oriented to person, place, and time.      ED Results / Procedures / Treatments   Labs (all labs ordered are listed, but only abnormal results are displayed) Labs Reviewed  COMPREHENSIVE METABOLIC PANEL - Abnormal; Notable for the following components:      Result Value   Potassium 3.3 (*)    Glucose, Bld 196 (*)    All other components within normal limits  CBC - Abnormal; Notable for the following components:   WBC 11.3 (*)    All other components within normal limits  URINALYSIS, ROUTINE W REFLEX MICROSCOPIC - Abnormal; Notable for the following components:   APPearance CLOUDY (*)    Ketones, ur 5 (*)    Leukocytes,Ua LARGE (*)    WBC, UA >50 (*)    Bacteria, UA MANY (*)    All other components within normal limits  LIPASE, BLOOD  CBG MONITORING, ED    EKG EKG Interpretation  Date/Time:  Tuesday May 15 2020 10:00:05 EDT Ventricular Rate:  70 PR Interval:    QRS Duration: 87 QT Interval:  445 QTC Calculation: 481 R Axis:   40 Text Interpretation: Sinus rhythm Borderline repolarization abnormality No significant change since last tracing Confirmed by [id number] [phone]) on 05/15/2020 10:02:52 AM   Radiology CT ABDOMEN PELVIS W CONTRAST  Result Date: 05/15/2020 CLINICAL DATA:  Bowel obstruction suspected EXAM: CT ABDOMEN AND PELVIS WITH CONTRAST TECHNIQUE: Multidetector CT imaging of the abdomen and pelvis was performed using the standard protocol following bolus administration of intravenous contrast. CONTRAST:  07/15/2020 OMNIPAQUE IOHEXOL 300 MG/ML  SOLN COMPARISON:  09/23/2013 FINDINGS: Lower chest: Lung bases are clear. Hepatobiliary: 5 mm cyst in the left hepatic lobe (series 3/image 16). Status post cholecystectomy. No intrahepatic or extrahepatic ductal dilatation. Pancreas: 5 mm ductal calcification in the pancreatic head. Associated main pancreatic ductal dilatation. Mild  parenchymal atrophy. No pancreatic mass. Spleen: Within normal limits. Adrenals/Urinary Tract: Adrenal glands are within normal  limits. 5.4 cm medial left upper pole renal cyst (series 3/image 22). Right kidney is within normal limits. No hydronephrosis. Bladder is within normal limits. Stomach/Bowel: Stomach is within normal limits. No evidence of bowel obstruction. Appendix is not discretely visualized. Mild scattered colonic diverticulosis, without evidence of diverticulitis. Vascular/Lymphatic: No evidence of abdominal aortic aneurysm. Atherosclerotic calcifications of the abdominal aorta and branch vessels. No suspicious abdominopelvic lymphadenopathy. Reproductive: Status post hysterectomy. No adnexal masses. Other: No abdominopelvic ascites. Musculoskeletal: Degenerative changes of the visualized thoracolumbar spine. Status post PLIF at L3-4. IMPRESSION: No evidence of bowel obstruction. 5 mm ductal calcification in the pancreatic head. Associated main pancreatic ductal dilatation. No pancreatic mass. Additional ancillary findings as above. Electronically Signed   By: Charline BillsSriyesh  Krishnan M.D.   On: 05/15/2020 13:35    Procedures Procedures (including critical care time)  Medications Ordered in ED Medications  iohexol (OMNIPAQUE) 300 MG/ML solution 100 mL (100 mLs Intravenous Contrast Given 05/15/20 1243)    ED Course  I have reviewed the triage vital signs and the nursing notes.  Pertinent labs & imaging results that were available during my care of the patient were reviewed by me and considered in my medical decision making (see chart for details).    MDM Rules/Calculators/A&P                           Patient presenting for evaluation of abdominal pain and one episode of emesis.  On exam, patient appears nontoxic.  She does have mild left-sided abdominal pain, although she does report that most of her pain has resolved.  Labs obtained from triage interpreted by me, overall reassuring.  No leukocytosis.  Kidney, liver, pancreatic function normal.  Urine is consistent with UTI.  As patient has left side abdominal  pain on exam, consider diverticulitis.  Also consider bowel obstruction as patient has been straining to have bowel movements.  Less likely kidney stone or Pilo as patient without CVA tenderness or fever.  Will order CT and reassess.  CT negative for acute findings.  On my interpretation of CT, patient does have moderate stool burden, in the setting of straining consider constipation.  This may be causing her abdominal spasm.  As such, have patient take Tylenol as needed for pain, use miralax, and stay well-hydrated.  Will treat UTI with Keflex, and have patient follow closely with PCP.  At this time, patient appears safe for discharge.  Return precautions given.  Patient states she understands and agrees to plan.  Final Clinical Impression(s) / ED Diagnoses Final diagnoses:  Acute abdominal pain  Constipation, unspecified constipation type  Urinary tract infection without hematuria, site unspecified    Rx / DC Orders ED Discharge Orders         Ordered    polyethylene glycol powder (MIRALAX) 17 GM/SCOOP powder        05/15/20 1345    cephALEXin (KEFLEX) 500 MG capsule  2 times daily        05/15/20 1345           Linn Clavin, PA-C 05/15/20 1400    Pollyann SavoySheldon, Charles B, MD 05/15/20 1451

## 2020-05-15 NOTE — Discharge Instructions (Addendum)
Your work-up today was overall reassuring.  Your CT scan was negative for acute findings including signs of infection or obstruction. If you have recurring abdominal pain, take Tylenol. Take antibiotics as prescribed for UTI.  Take the entire course, even if your symptoms improve. Make sure you are staying well-hydrated water. As you have been straining to have bowel movements, use MiraLAX as needed until you are having regular bowel movements. Follow-up with your primary care doctor as needed for recheck. Return to the emergency room if you develop high fevers, persistent vomiting, severe worsening abdominal pain, inability to have a bowel movement or pass gas, or with any new, worsening, concerning symptoms.

## 2020-05-28 DIAGNOSIS — N3000 Acute cystitis without hematuria: Secondary | ICD-10-CM | POA: Diagnosis not present

## 2020-05-28 DIAGNOSIS — K5901 Slow transit constipation: Secondary | ICD-10-CM | POA: Diagnosis not present

## 2020-05-28 DIAGNOSIS — I10 Essential (primary) hypertension: Secondary | ICD-10-CM | POA: Diagnosis not present

## 2020-05-28 DIAGNOSIS — R070 Pain in throat: Secondary | ICD-10-CM | POA: Diagnosis not present

## 2020-06-09 ENCOUNTER — Ambulatory Visit: Payer: Medicare Other | Attending: Internal Medicine

## 2020-06-09 DIAGNOSIS — Z23 Encounter for immunization: Secondary | ICD-10-CM

## 2020-06-09 NOTE — Progress Notes (Signed)
   Covid-19 Vaccination Clinic  Name:  Rhonda Watkins    MRN: [id number] DOB: Feb 20, 1932  06/09/2020  Rhonda Watkins was observed post Covid-19 immunization for 15 minutes without incident. She was provided with Vaccine Information Sheet and instruction to access the V-Safe system.   Rhonda Watkins was instructed to call 911 with any severe reactions post vaccine: Marland Kitchen Difficulty breathing  . Swelling of face and throat  . A fast heartbeat  . A bad rash all over body  . Dizziness and weakness

## 2020-08-17 ENCOUNTER — Other Ambulatory Visit: Payer: Self-pay | Admitting: Neurosurgery

## 2020-08-17 DIAGNOSIS — M5440 Lumbago with sciatica, unspecified side: Secondary | ICD-10-CM

## 2020-08-29 ENCOUNTER — Other Ambulatory Visit: Payer: Medicare Other

## 2020-09-04 ENCOUNTER — Ambulatory Visit
Admission: RE | Admit: 2020-09-04 | Discharge: 2020-09-04 | Disposition: A | Discharge status: Home / Self Care | Payer: Medicare Other | Source: Ambulatory Visit | Attending: Neurosurgery | Admitting: Neurosurgery

## 2020-09-04 ENCOUNTER — Other Ambulatory Visit: Payer: Self-pay

## 2020-09-04 DIAGNOSIS — M5440 Lumbago with sciatica, unspecified side: Secondary | ICD-10-CM

## 2020-09-04 DIAGNOSIS — M47817 Spondylosis without myelopathy or radiculopathy, lumbosacral region: Secondary | ICD-10-CM | POA: Diagnosis not present

## 2020-09-04 MED ORDER — METHYLPREDNISOLONE ACETATE 40 MG/ML INJ SUSP (RADIOLOG
120.0000 mg | Freq: Once | INTRAMUSCULAR | Status: AC
  Administered 2020-09-04: 120 mg via EPIDURAL

## 2020-09-04 MED ORDER — IOPAMIDOL (ISOVUE-M 200) INJECTION 41%
1.0000 mL | Freq: Once | INTRAMUSCULAR | Status: AC
  Administered 2020-09-04: 1 mL via EPIDURAL

## 2020-09-04 NOTE — Discharge Instructions (Signed)

## 2020-09-06 ENCOUNTER — Telehealth: Payer: Self-pay | Admitting: Podiatry

## 2020-09-06 NOTE — Telephone Encounter (Signed)
Patient got a spinal injection last week, and she wanted to know if it was ok to still get the corn removal on 09/11/2020. Please advise

## 2020-09-06 NOTE — Telephone Encounter (Signed)
Herbert Seta- can you please call her and let her know it is OK. Thanks.

## 2020-09-11 ENCOUNTER — Ambulatory Visit: Payer: Medicare Other | Admitting: Podiatry

## 2020-09-19 DIAGNOSIS — K219 Gastro-esophageal reflux disease without esophagitis: Secondary | ICD-10-CM | POA: Diagnosis not present

## 2020-09-19 DIAGNOSIS — J4530 Mild persistent asthma, uncomplicated: Secondary | ICD-10-CM | POA: Diagnosis not present

## 2020-09-19 DIAGNOSIS — Z1389 Encounter for screening for other disorder: Secondary | ICD-10-CM | POA: Diagnosis not present

## 2020-09-19 DIAGNOSIS — Z0000 Encounter for general adult medical examination without abnormal findings: Secondary | ICD-10-CM | POA: Diagnosis not present

## 2020-09-19 DIAGNOSIS — I10 Essential (primary) hypertension: Secondary | ICD-10-CM | POA: Diagnosis not present

## 2020-09-25 ENCOUNTER — Ambulatory Visit: Payer: Medicare Other | Admitting: Podiatry

## 2020-09-25 ENCOUNTER — Other Ambulatory Visit: Payer: Self-pay

## 2020-09-25 DIAGNOSIS — Q828 Other specified congenital malformations of skin: Secondary | ICD-10-CM

## 2020-09-25 DIAGNOSIS — M205X2 Other deformities of toe(s) (acquired), left foot: Secondary | ICD-10-CM | POA: Diagnosis not present

## 2020-09-25 DIAGNOSIS — L84 Corns and callosities: Secondary | ICD-10-CM

## 2020-09-25 DIAGNOSIS — M79675 Pain in left toe(s): Secondary | ICD-10-CM | POA: Diagnosis not present

## 2020-09-29 NOTE — Progress Notes (Signed)
Subjective: 87 year old female presents the office today for concerns of a corn to her left fifth toe.  This started after she were sitting on her foot that cause pressure.  Areas been tender.  Denies any redness or drainage or any swelling.  She said no recent treatment otherwise and no other concerns. Denies any systemic complaints such as fevers, chills, nausea, vomiting. No acute changes since last appointment.  Objective: AAO x3, NAD DP/PT pulses palpable bilaterally, CRT less than 3 seconds Adductovarus is present of the left fifth toe.  Hyperkeratotic lesion the dorsal lateral aspect of the fifth digit.  Upon debridement there is no underlying ulceration drainage or any signs of infection noted today. No pain with calf compression, swelling, warmth, erythema  Assessment: Left fifth toe digital deformity resulting hyperkeratotic lesion  Plan: -All treatment options discussed with the patient including all alternatives, risks, complications.  -Sharply debrided the hyperkeratotic lesion without any complications or bleeding.  Dispensed offloading pads and discussed shoe modifications as well. -Patient encouraged to call the office with any questions, concerns, change in symptoms.   Vivi Barrack DPM

## 2020-10-09 DIAGNOSIS — N390 Urinary tract infection, site not specified: Secondary | ICD-10-CM | POA: Diagnosis not present

## 2020-10-16 DIAGNOSIS — I10 Essential (primary) hypertension: Secondary | ICD-10-CM | POA: Diagnosis not present

## 2020-10-16 DIAGNOSIS — M79604 Pain in right leg: Secondary | ICD-10-CM | POA: Diagnosis not present

## 2020-10-19 DIAGNOSIS — M7121 Synovial cyst of popliteal space [Baker], right knee: Secondary | ICD-10-CM | POA: Diagnosis not present

## 2020-10-19 DIAGNOSIS — M79604 Pain in right leg: Secondary | ICD-10-CM | POA: Diagnosis not present

## 2020-10-19 DIAGNOSIS — R34 Anuria and oliguria: Secondary | ICD-10-CM | POA: Diagnosis not present

## 2020-11-13 DIAGNOSIS — N390 Urinary tract infection, site not specified: Secondary | ICD-10-CM | POA: Diagnosis not present

## 2020-11-22 DIAGNOSIS — I10 Essential (primary) hypertension: Secondary | ICD-10-CM | POA: Diagnosis not present

## 2020-11-22 DIAGNOSIS — M5440 Lumbago with sciatica, unspecified side: Secondary | ICD-10-CM | POA: Diagnosis not present

## 2020-11-22 DIAGNOSIS — Z6826 Body mass index (BMI) 26.0-26.9, adult: Secondary | ICD-10-CM | POA: Diagnosis not present

## 2020-11-23 ENCOUNTER — Other Ambulatory Visit: Payer: Self-pay | Admitting: Student

## 2020-11-23 DIAGNOSIS — M5440 Lumbago with sciatica, unspecified side: Secondary | ICD-10-CM

## 2020-11-26 DIAGNOSIS — M1711 Unilateral primary osteoarthritis, right knee: Secondary | ICD-10-CM | POA: Diagnosis not present

## 2020-11-28 ENCOUNTER — Other Ambulatory Visit: Payer: Medicare Other

## 2020-12-06 ENCOUNTER — Other Ambulatory Visit: Payer: Medicare Other

## 2020-12-17 ENCOUNTER — Other Ambulatory Visit: Payer: Self-pay | Admitting: Allergy and Immunology

## 2020-12-24 ENCOUNTER — Ambulatory Visit: Payer: Medicare Other | Admitting: Podiatry

## 2020-12-24 ENCOUNTER — Other Ambulatory Visit: Payer: Self-pay

## 2020-12-24 ENCOUNTER — Encounter: Payer: Self-pay | Admitting: Podiatry

## 2020-12-24 DIAGNOSIS — E1165 Type 2 diabetes mellitus with hyperglycemia: Secondary | ICD-10-CM

## 2020-12-24 DIAGNOSIS — Q828 Other specified congenital malformations of skin: Secondary | ICD-10-CM | POA: Diagnosis not present

## 2020-12-24 DIAGNOSIS — M79675 Pain in left toe(s): Secondary | ICD-10-CM

## 2020-12-24 DIAGNOSIS — J4530 Mild persistent asthma, uncomplicated: Secondary | ICD-10-CM | POA: Insufficient documentation

## 2020-12-24 DIAGNOSIS — E1169 Type 2 diabetes mellitus with other specified complication: Secondary | ICD-10-CM | POA: Insufficient documentation

## 2020-12-24 DIAGNOSIS — M810 Age-related osteoporosis without current pathological fracture: Secondary | ICD-10-CM | POA: Insufficient documentation

## 2020-12-25 ENCOUNTER — Ambulatory Visit
Admission: RE | Admit: 2020-12-25 | Discharge: 2020-12-25 | Disposition: A | Discharge status: Home / Self Care | Payer: Medicare Other | Source: Ambulatory Visit | Attending: Student | Admitting: Student

## 2020-12-25 DIAGNOSIS — M5440 Lumbago with sciatica, unspecified side: Secondary | ICD-10-CM

## 2020-12-25 DIAGNOSIS — M48061 Spinal stenosis, lumbar region without neurogenic claudication: Secondary | ICD-10-CM | POA: Diagnosis not present

## 2020-12-25 DIAGNOSIS — M47817 Spondylosis without myelopathy or radiculopathy, lumbosacral region: Secondary | ICD-10-CM | POA: Diagnosis not present

## 2020-12-25 MED ORDER — IOPAMIDOL (ISOVUE-M 200) INJECTION 41%
1.0000 mL | Freq: Once | INTRAMUSCULAR | Status: AC
  Administered 2020-12-25: 1 mL via EPIDURAL

## 2020-12-25 MED ORDER — METHYLPREDNISOLONE ACETATE 40 MG/ML INJ SUSP (RADIOLOG
120.0000 mg | Freq: Once | INTRAMUSCULAR | Status: AC
  Administered 2020-12-25: 120 mg via EPIDURAL

## 2020-12-25 NOTE — Discharge Instructions (Signed)

## 2020-12-26 NOTE — Progress Notes (Signed)
Subjective: 87 year old female presents the office today for concerns of a recurrent corn the left fifth toe causing discomfort.  Denies any ulceration, drainage or pus or any swelling.  It did feel better after last appointment for some time before the pain started to come back. Denies any systemic complaints such as fevers, chills, nausea, vomiting. No acute changes since last appointment.  Objective: AAO x3, NAD DP/PT pulses palpable bilaterally, CRT less than 3 seconds Adductovarus is present of the left fifth toe.  Hyperkeratotic lesion the dorsal lateral aspect of the fifth digit.  Upon debridement there is no underlying ulceration drainage or any signs of infection noted today. No pain with calf compression, swelling, warmth, erythema  Assessment: Left fifth toe digital deformity resulting hyperkeratotic lesion  Plan: -All treatment options discussed with the patient including all alternatives, risks, complications.  -Sharply debrided the hyperkeratotic lesion without any complications or bleeding.  I dispensed new various offloading pads to help peer discussed shoe modifications. -Patient encouraged to call the office with any questions, concerns, change in symptoms.   Vivi Barrack DPM

## 2021-01-02 DIAGNOSIS — I10 Essential (primary) hypertension: Secondary | ICD-10-CM | POA: Diagnosis not present

## 2021-01-02 DIAGNOSIS — K146 Glossodynia: Secondary | ICD-10-CM | POA: Diagnosis not present

## 2021-01-07 ENCOUNTER — Other Ambulatory Visit: Payer: Self-pay | Admitting: Allergy and Immunology

## 2021-01-17 DIAGNOSIS — S51812A Laceration without foreign body of left forearm, initial encounter: Secondary | ICD-10-CM | POA: Diagnosis not present

## 2021-01-17 DIAGNOSIS — I10 Essential (primary) hypertension: Secondary | ICD-10-CM | POA: Diagnosis not present

## 2021-01-17 DIAGNOSIS — E1169 Type 2 diabetes mellitus with other specified complication: Secondary | ICD-10-CM | POA: Diagnosis not present

## 2021-01-17 DIAGNOSIS — Z79899 Other long term (current) drug therapy: Secondary | ICD-10-CM | POA: Diagnosis not present

## 2021-01-17 DIAGNOSIS — J4530 Mild persistent asthma, uncomplicated: Secondary | ICD-10-CM | POA: Diagnosis not present

## 2021-01-22 ENCOUNTER — Other Ambulatory Visit: Payer: Self-pay

## 2021-01-22 ENCOUNTER — Ambulatory Visit: Payer: Medicare Other | Admitting: Allergy and Immunology

## 2021-01-22 VITALS — BP 136/62 | HR 70 | Temp 97.6°F | Resp 14 | Ht 62.0 in | Wt 147.0 lb

## 2021-01-22 DIAGNOSIS — J4530 Mild persistent asthma, uncomplicated: Secondary | ICD-10-CM | POA: Diagnosis not present

## 2021-01-22 DIAGNOSIS — J3089 Other allergic rhinitis: Secondary | ICD-10-CM

## 2021-01-22 DIAGNOSIS — K219 Gastro-esophageal reflux disease without esophagitis: Secondary | ICD-10-CM

## 2021-01-22 MED ORDER — ALBUTEROL SULFATE HFA 108 (90 BASE) MCG/ACT IN AERS: 1.0000 | INHALATION_SPRAY | Freq: Four times a day (QID) | RESPIRATORY_TRACT | 3 refills | Status: DC | PRN

## 2021-01-22 MED ORDER — ASMANEX (60 METERED DOSES) 220 MCG/INH IN AEPB: 1.0000 | INHALATION_SPRAY | Freq: Every day | RESPIRATORY_TRACT | 5 refills | Status: DC

## 2021-01-22 MED ORDER — OMEPRAZOLE MAGNESIUM 20 MG PO TBEC: 20.0000 mg | DELAYED_RELEASE_TABLET | Freq: Every day | ORAL | 5 refills | Status: DC

## 2021-01-22 MED ORDER — FAMOTIDINE 40 MG PO TABS: 40.0000 mg | ORAL_TABLET | Freq: Every day | ORAL | 5 refills | Status: DC

## 2021-01-22 NOTE — Progress Notes (Signed)
[place] [facility] [place] [facility] [place] [facility] [place] - [place]   Follow-up Note  Referring Provider: Merlene Laughter, MD Primary Provider: Merlene Laughter, MD Date of Office Visit: 01/22/2021  Subjective:   Rhonda Watkins (DOB: 1931/09/16) is a 87 y.o. female who [facility] in re-evaluation of the following:  HPI: [name] returns to this clinic in evaluation of asthma and allergic rhinoconjunctivitis and history of LPR.  Her last visit to this clinic was 17 January 2020.  She has once again had an excellent interval of time while consistently using Asmanex at relatively low dose and continuing on therapy for reflux including a proton pump inhibitor and H2 receptor blocker.  She has not required a systemic steroid or an antibiotic for any type of airway issue and she rarely if ever uses a short acting bronchodilator.  Because of various musculoskeletal issues she does not really exert herself to any significant degree.  She has not been have any reflux problems and she has not been having any throat problems.  But something new for her is the development of right tongue pain.  Apparently this developed several weeks ago.  She had a root canal about 6 weeks ago and she is not sure if she had this problem before the root canal or after the root canal.  She has seen her dentist and the oral surgeon who performed a root canal and has seen her primary care doctor and has a scheduled appointment with Dr. Ezzard Standing, ENT,.  Fortunately, her discomfort affecting the lateral aspect of her right tongue is now an intermittent issue and is less intense.  It was never associated with any systemic or constitutional symptoms.  She has had 3 Pfizer COVID vaccines.  Allergies as of 01/22/2021       Reactions   Jardiance [empagliflozin]    Linagliptin    tradjenta   Metformin And Related         Medication List      ascorbic acid 1000 MG tablet Commonly known as:  VITAMIN C Take by mouth.   Asmanex (60 Metered Doses) 220 MCG/INH inhaler Generic drug: mometasone Inhale 1-2 puffs into the lungs daily.   atorvastatin 10 MG tablet Commonly known as: LIPITOR   Basaglar KwikPen 100 UNIT/ML Inject 12 Units into the skin daily.   Biotin 1 MG Caps Take by mouth.   diltiazem 180 MG 24 hr capsule Commonly known as: CARDIZEM CD   diltiazem 180 MG 24 hr capsule Commonly known as: DILACOR XR Take 180 mg by mouth daily.   famotidine 40 MG tablet Commonly known as: PEPCID TAKE ONE TABLET AT BEDTIME.   furosemide 40 MG tablet Commonly known as: LASIX Take 40 mg by mouth every other day.   guaiFENesin 600 MG 12 hr tablet Commonly known as: MUCINEX Take by mouth 2 (two) times daily as needed.   Lancets Misc twice a day as directed   MULTI COMPLETE PO Take 1 tablet by mouth daily.   omeprazole 20 MG capsule Commonly known as: PRILOSEC TAKE 1 CAPSULE DAILY.   OneTouch Verio test strip Generic drug: glucose blood CHECK BLOOD SUGAR TWICE DAILY AS DIRECTED.   polyethylene glycol powder 17 GM/SCOOP powder Commonly known as: MiraLax Use 1-2 caps in 8 oz liquid up to 3 times a day as needed until you are having regular bowel movements.   Potassium Chloride CR 8 MEQ Cpcr capsule CR Commonly known as: MICRO-K Take 8  mEq by mouth daily.   predniSONE 10 MG (21) Tbpk tablet Commonly known as: STERAPRED UNI-PAK 21 TAB Take by mouth as needed.   sitaGLIPtin 50 MG tablet Commonly known as: JANUVIA Take 50 mg by mouth daily.   Sure Comfort Pen Needles 31G X 8 MM Misc Generic drug: Insulin Pen Needle SMARTSIG:Injection   telmisartan 80 MG tablet Commonly known as: MICARDIS Take 80 mg by mouth daily.   VITAMIN D PO Take 3,000 Units by mouth daily.        Past Medical History:  Diagnosis Date   Arthritis    Asthma    GERD (gastroesophageal reflux disease)    Hyperlipidemia    Hypertension    Mitral regurgitation    Shortness of  breath dyspnea    occ   UTI (lower urinary tract infection)    Vitamin D deficiency     Past Surgical History:  Procedure Laterality Date   BACK SURGERY  85,04   BLADDER REPAIR  95,05   BUNIONECTOMY Left 15   cholecyctectomy     CHOLECYSTECTOMY  80   COLON SURGERY  05   EXPLORATION MIDDLE EAR Left    vertigo- no hearing now- 87 yrs old   HERNIA REPAIR  [phone]   KNEE ARTHROSCOPY     lft 02 rt 09    Review of systems negative except as noted in HPI / PMHx or noted below:  Review of Systems  Constitutional: Negative.   HENT: Negative.    Eyes: Negative.   Respiratory: Negative.    Cardiovascular: Negative.   Gastrointestinal: Negative.   Genitourinary: Negative.   Musculoskeletal: Negative.   Skin: Negative.   Neurological: Negative.   Endo/Heme/Allergies: Negative.   Psychiatric/Behavioral: Negative.      Objective:   Vitals:   01/22/21 1126  BP: 136/62  Pulse: 70  Resp: 14  Temp: 97.6 F (36.4 C)  SpO2: 96%   Height: 5\' 2"  (157.5 cm)  Weight: 147 lb (66.7 kg)   Physical Exam Constitutional:      Appearance: She is not diaphoretic.  HENT:     Head: Normocephalic.     Right Ear: External ear normal.     Left Ear: External ear normal.     Ears:     Comments: Hearing aids    Nose: Nose normal. No mucosal edema or rhinorrhea.     Mouth/Throat:     Pharynx: Uvula midline. No oropharyngeal exudate.  Eyes:     Conjunctiva/sclera: Conjunctivae normal.  Neck:     Thyroid: No thyromegaly.     Trachea: Trachea normal. No tracheal tenderness or tracheal deviation.  Cardiovascular:     Rate and Rhythm: Normal rate and regular rhythm.     Heart sounds: Normal heart sounds, S1 normal and S2 normal. No murmur heard. Pulmonary:     Effort: No respiratory distress.     Breath sounds: Normal breath sounds. No stridor. No wheezing or rales.  Lymphadenopathy:     Head:     Right side of head: No tonsillar adenopathy.     Left side of head: No tonsillar  adenopathy.     Cervical: No cervical adenopathy.  Skin:    Findings: No erythema or rash.     Nails: There is no clubbing.  Neurological:     Mental Status: She is alert.    Diagnostics:    Spirometry was performed and demonstrated an FEV1 of 1.87 at 88 % of predicted.   Assessment and Plan:  1. Asthma, well controlled, mild persistent   2. Other allergic rhinitis   3. LPRD (laryngopharyngeal reflux disease)     1. Continue Asmanex 220 1 inhalation 1 time per day  2. Continue Prilosec 20 mg in a.m. + famotidine 40 mg in p.m.  3. Continue Albuterol HFA 2 puffs every 4-6 hours if needed  4. Return to clinic in 12 months or earlier if problem  Overall Suheily is doing quite well regarding her asthma and allergic rhinitis and her reflux.  I am not going to change any of her therapy at this point in time.  She does have this tongue discomfort and she will have that evaluated by Dr. Ezzard Standing, ENT, at some point in the near future.  Fortunately, this appears to be an issue that is slowly resolving.  I will see her back in his clinic in 1 year or earlier if there is a problem.     Laurette Schimke, MD Allergy / Immunology [facility]

## 2021-01-22 NOTE — Patient Instructions (Signed)
  1. Continue Asmanex 220 1 inhalation 1 time per day  2. Continue Prilosec 20 mg in a.m. + famotidine 40 mg in p.m.  3. Continue Albuterol HFA 2 puffs every 4-6 hours if needed  4. Return to clinic in 12 months or earlier if problem

## 2021-01-23 ENCOUNTER — Encounter: Payer: Self-pay | Admitting: Allergy and Immunology

## 2021-01-30 DIAGNOSIS — R300 Dysuria: Secondary | ICD-10-CM | POA: Diagnosis not present

## 2021-02-06 DIAGNOSIS — H52203 Unspecified astigmatism, bilateral: Secondary | ICD-10-CM | POA: Diagnosis not present

## 2021-02-14 DIAGNOSIS — H5000 Unspecified esotropia: Secondary | ICD-10-CM | POA: Diagnosis not present

## 2021-02-14 DIAGNOSIS — H532 Diplopia: Secondary | ICD-10-CM | POA: Diagnosis not present

## 2021-02-20 ENCOUNTER — Ambulatory Visit (INDEPENDENT_AMBULATORY_CARE_PROVIDER_SITE_OTHER): Payer: Medicare Other | Admitting: Otolaryngology

## 2021-02-20 DIAGNOSIS — H52203 Unspecified astigmatism, bilateral: Secondary | ICD-10-CM | POA: Diagnosis not present

## 2021-02-21 ENCOUNTER — Ambulatory Visit: Payer: Medicare Other | Admitting: Podiatry

## 2021-02-21 ENCOUNTER — Other Ambulatory Visit: Payer: Self-pay

## 2021-02-21 DIAGNOSIS — Q828 Other specified congenital malformations of skin: Secondary | ICD-10-CM | POA: Diagnosis not present

## 2021-02-21 DIAGNOSIS — E1165 Type 2 diabetes mellitus with hyperglycemia: Secondary | ICD-10-CM

## 2021-02-26 NOTE — Progress Notes (Signed)
Subjective: 87 year old female presents the office today for concerns of a recurrent corn the left fifth toe causing discomfort.  Denies any ulceration, drainage or pus or any swelling.  Denies any systemic complaints such as fevers, chills, nausea, vomiting. No acute changes since last appointment.  Objective: AAO x3, NAD DP/PT pulses palpable bilaterally, CRT less than 3 seconds Adductovarus is present of the left fifth toe.  Hyperkeratotic lesion the dorsal lateral aspect of the fifth digit.  Upon debridement there is no underlying ulceration drainage or any signs of infection noted today. No pain with calf compression, swelling, warmth, erythema  Assessment: Left fifth toe digital deformity resulting hyperkeratotic lesion  Plan: -All treatment options discussed with the patient including all alternatives, risks, complications.  -We discussed the etiology corn.  Raynelle Fanning does appear discomfort pressure is likely causing the irritation, pain. -Sharply debrided the hyperkeratotic lesion without any complications or bleeding.  I dispensed new various offloading pads and also discussed shoe modifications. -Patient encouraged to call the office with any questions, concerns, change in symptoms.   Vivi Barrack DPM

## 2021-03-04 ENCOUNTER — Ambulatory Visit (INDEPENDENT_AMBULATORY_CARE_PROVIDER_SITE_OTHER): Payer: Medicare Other | Admitting: Otolaryngology

## 2021-03-20 IMAGING — CT CT ABD-PELV W/ CM
2 of 5 series · 16 of 46 positions shown, 18 images · IV contrast (APPLIED)
Comparison: 09/23/2013

CLINICAL DATA: Bowel obstruction suspected

EXAM:
CT ABDOMEN AND PELVIS WITH CONTRAST
TECHNIQUE: Multidetector CT imaging of the abdomen and pelvis was performed
using the standard protocol following bolus administration of
intravenous contrast.
CONTRAST:  100mL OMNIPAQUE IOHEXOL 300 MG/ML  SOLN

[Series 3: abd/ pelvis 5.0 i30f 2 · axial · 0.79mm/px · z∈[+972,+1342]mm · 13 of 84 slices shown, 15 images]
[im 5/84  soft-tissue]
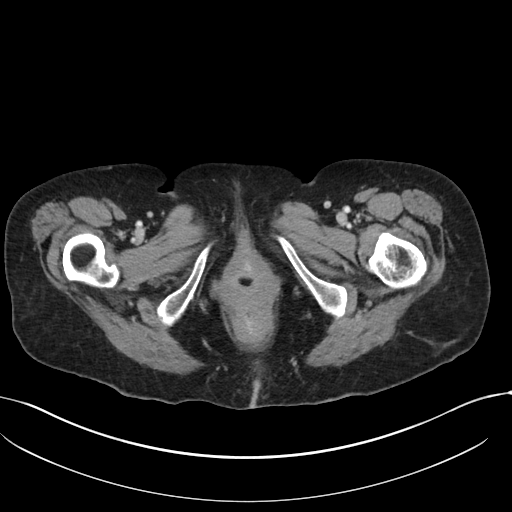
[im 5/84  bone]
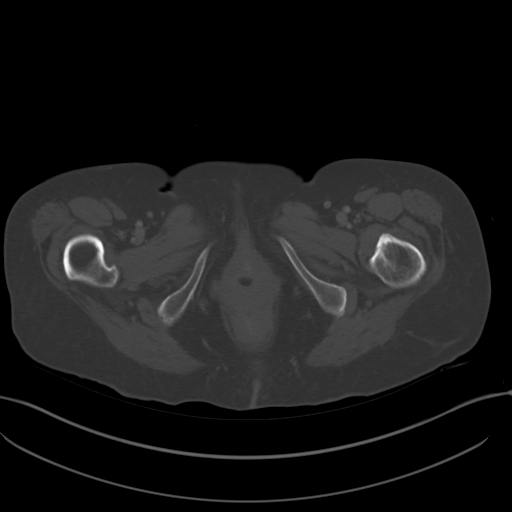
[im 14/84  soft-tissue]
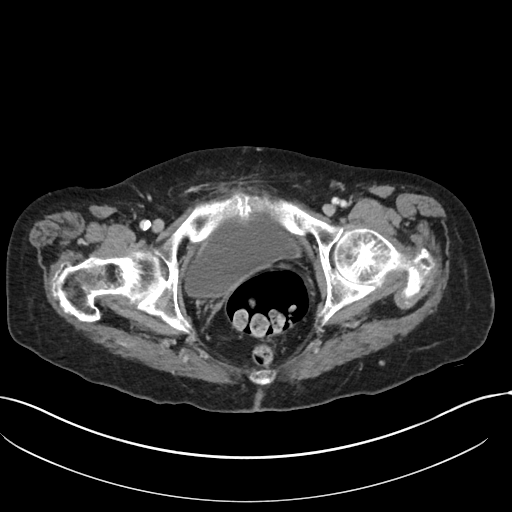
[im 18/84  soft-tissue]
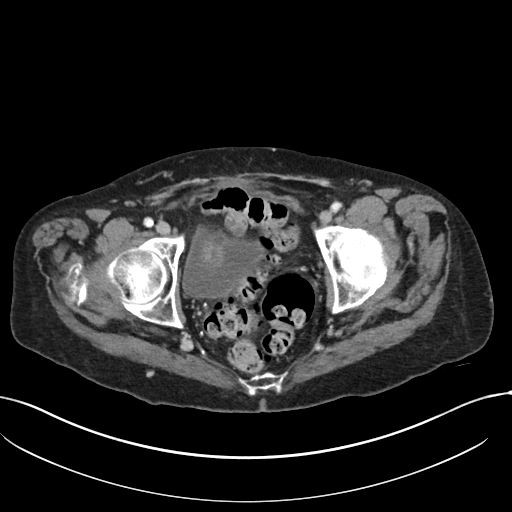
[im 22/84  soft-tissue]
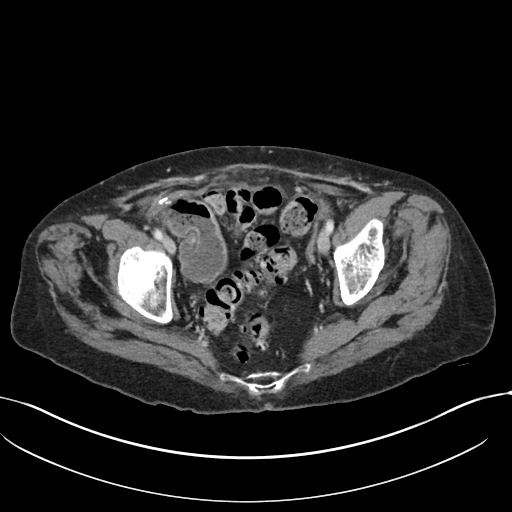
[im 31/84  soft-tissue]
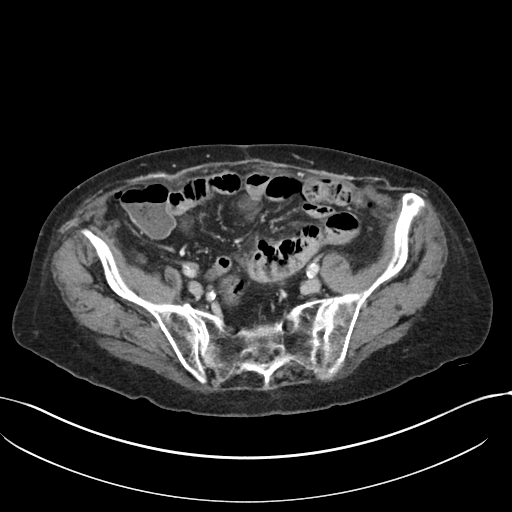
[im 35/84  soft-tissue]
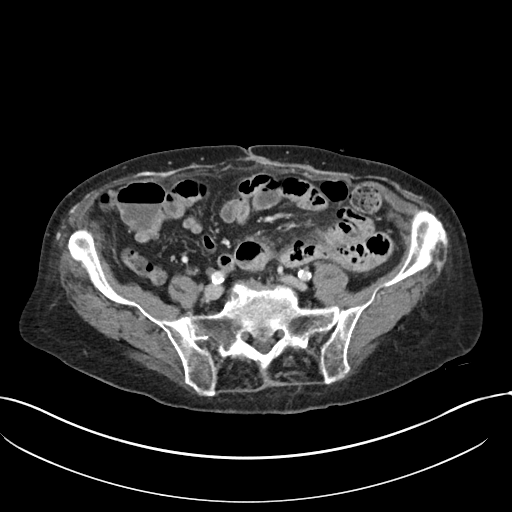
[im 44/84  soft-tissue]
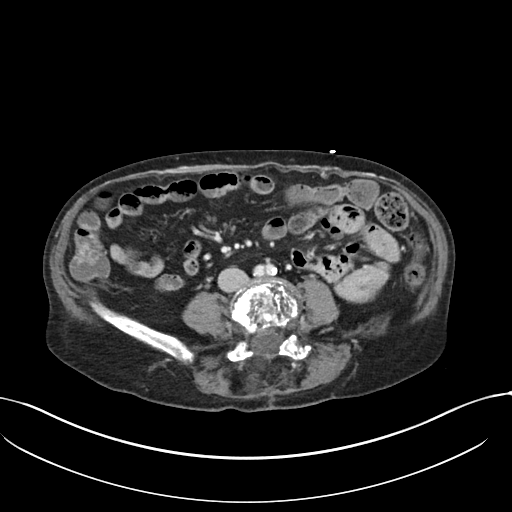
[im 49/84  soft-tissue]
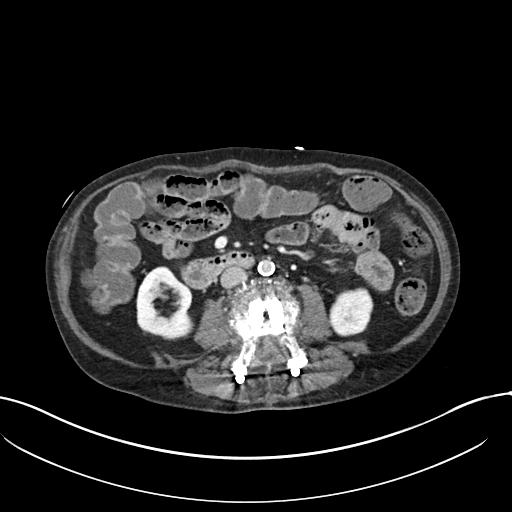
[im 53/84  soft-tissue]
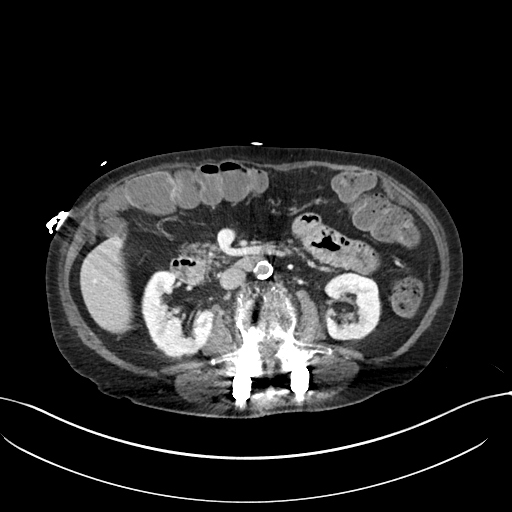
[im 53/84  bone]
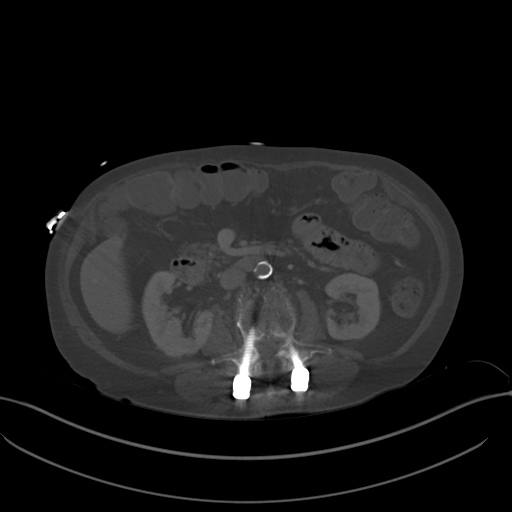
[im 62/84  soft-tissue]
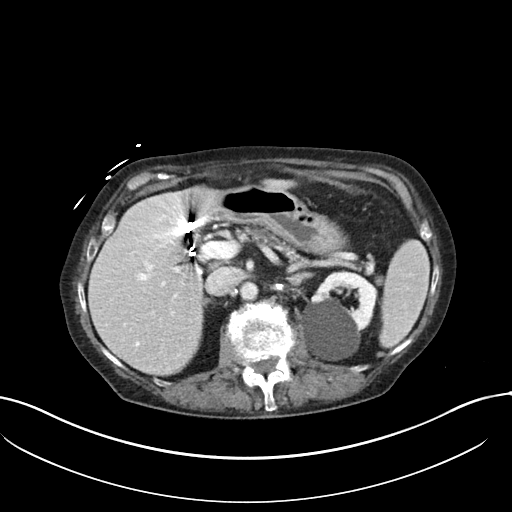
[im 66/84  soft-tissue]
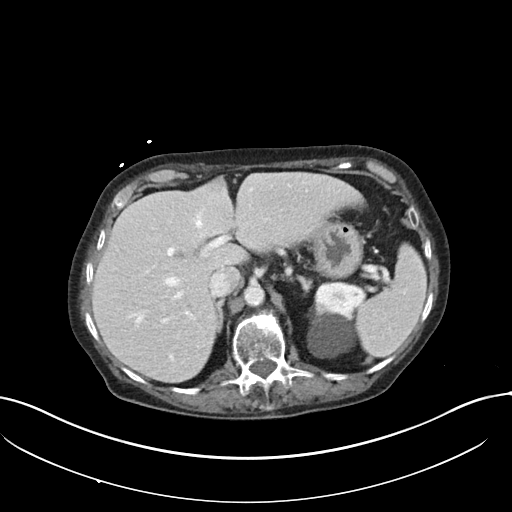
[im 70/84  soft-tissue]
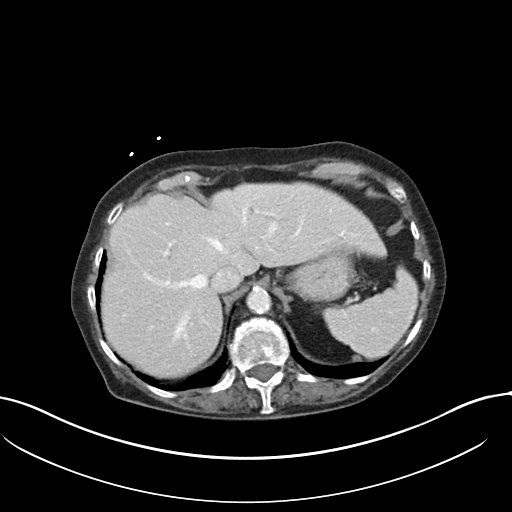
[im 79/84  soft-tissue]
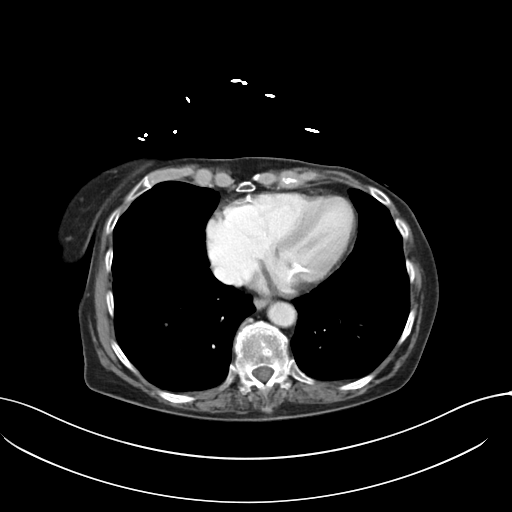

[Series 6: coronal soft tissue · coronal · 0.75mm/px · 3 of 81 slices shown]
[im 27/81  soft-tissue]
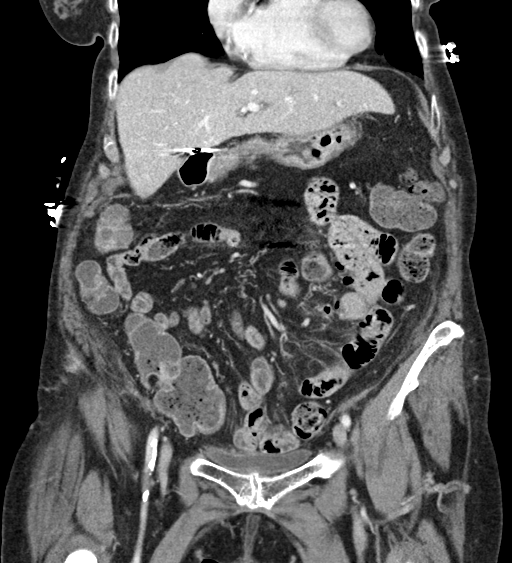
[im 36/81  soft-tissue]
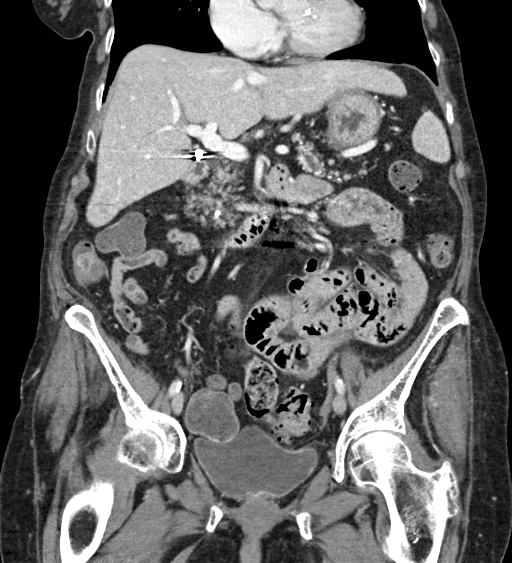
[im 45/81  soft-tissue]
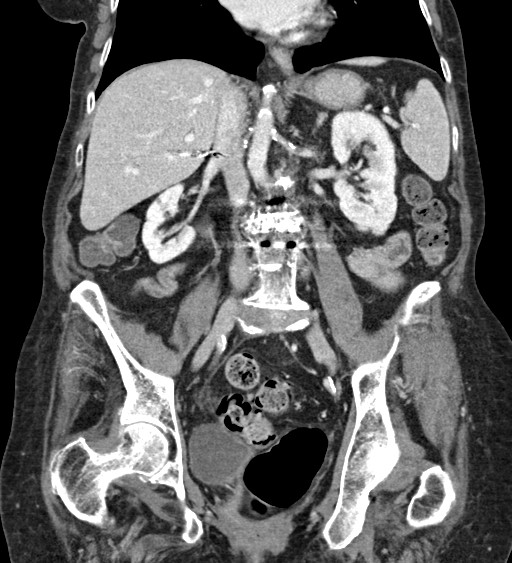

[16 of 46 positions shown; findings below may reference images not displayed]

FINDINGS: Lower chest: Lung bases are clear.

Hepatobiliary: 5 mm cyst in the left hepatic lobe (series 3/image
16).

Status post cholecystectomy. No intrahepatic or extrahepatic ductal
dilatation.

Pancreas: 5 mm ductal calcification in the pancreatic head.
Associated main pancreatic ductal dilatation. Mild parenchymal
atrophy. No pancreatic mass.

Spleen: Within normal limits.

Adrenals/Urinary Tract: Adrenal glands are within normal limits.

5.4 cm medial left upper pole renal cyst (series 3/image 22). Right
kidney is within normal limits. No hydronephrosis.

Bladder is within normal limits.

Stomach/Bowel: Stomach is within normal limits.

No evidence of bowel obstruction.

Appendix is not discretely visualized.

Mild scattered colonic diverticulosis, without evidence of
diverticulitis.

Vascular/Lymphatic: No evidence of abdominal aortic aneurysm.

Atherosclerotic calcifications of the abdominal aorta and branch
vessels.

No suspicious abdominopelvic lymphadenopathy.

Reproductive: Status post hysterectomy.

No adnexal masses.

Other: No abdominopelvic ascites.

Musculoskeletal: Degenerative changes of the visualized
thoracolumbar spine. Status post PLIF at L3-4.
IMPRESSION: No evidence of bowel obstruction.

5 mm ductal calcification in the pancreatic head. Associated main
pancreatic ductal dilatation. No pancreatic mass.

Additional ancillary findings as above.

## 2021-03-22 DIAGNOSIS — R300 Dysuria: Secondary | ICD-10-CM | POA: Diagnosis not present

## 2021-04-01 ENCOUNTER — Telehealth: Payer: Self-pay | Admitting: Allergy and Immunology

## 2021-04-01 NOTE — Telephone Encounter (Signed)
Patient uses Ventolin and said she lost it when visiting her husband in the hospital and would like to know if another one can be sent to her pharmacy? Pharmacy on file is the correct one.

## 2021-04-01 NOTE — Telephone Encounter (Signed)
Left a detailed message informing patient that Prescription was sent in on 01/22/2021 with 3 additional refills. Patient will need to contact pharmacy and have them refill it.

## 2021-04-10 DIAGNOSIS — R300 Dysuria: Secondary | ICD-10-CM | POA: Diagnosis not present

## 2021-04-16 DIAGNOSIS — L570 Actinic keratosis: Secondary | ICD-10-CM | POA: Diagnosis not present

## 2021-04-16 DIAGNOSIS — L298 Other pruritus: Secondary | ICD-10-CM | POA: Diagnosis not present

## 2021-04-18 ENCOUNTER — Ambulatory Visit (INDEPENDENT_AMBULATORY_CARE_PROVIDER_SITE_OTHER): Payer: Medicare Other | Admitting: Otolaryngology

## 2021-04-23 ENCOUNTER — Ambulatory Visit (INDEPENDENT_AMBULATORY_CARE_PROVIDER_SITE_OTHER): Payer: Medicare Other | Admitting: Otolaryngology

## 2021-04-23 ENCOUNTER — Other Ambulatory Visit: Payer: Self-pay

## 2021-04-23 DIAGNOSIS — K146 Glossodynia: Secondary | ICD-10-CM | POA: Diagnosis not present

## 2021-04-23 NOTE — Progress Notes (Signed)
HPI: Rhonda Watkins is a 87 y.o. female who presents is referred by her PCP for evaluation of intermittent tongue soreness.  She complained of some soreness on the right side of her tongue a couple months ago and saw her dentist who thought she needed further evaluation.  The soreness is much better today and she is does not have any soreness in the office today.  She states that food occasionally gets call on the right side of her tongue.  Apparently she had a root canal performed and this could have contributed some to the soreness of the tongue.  This is doing much better..  Past Medical History:  Diagnosis Date   Arthritis    Asthma    GERD (gastroesophageal reflux disease)    Hyperlipidemia    Hypertension    Mitral regurgitation    Shortness of breath dyspnea    occ   UTI (lower urinary tract infection)    Vitamin D deficiency    Past Surgical History:  Procedure Laterality Date   BACK SURGERY  85,04   BLADDER REPAIR  95,05   BUNIONECTOMY Left 17   cholecyctectomy     CHOLECYSTECTOMY  82   COLON SURGERY  05   EXPLORATION MIDDLE EAR Left    vertigo- no hearing now- 87 yrs old   HERNIA REPAIR  [phone]   KNEE ARTHROSCOPY     lft 02 rt 09   Social History   Socioeconomic History   Marital status: Married    Spouse name: Not on file   Number of children: Not on file   Years of education: Not on file   Highest education level: Not on file  Occupational History   Not on file  Tobacco Use   Smoking status: Former    Types: Cigarettes    Quit date: 09/17/1958    Years since quitting: 62.6   Smokeless tobacco: Never  Vaping Use   Vaping Use: Never used  Substance and Sexual Activity   Alcohol use: Yes    Comment: occ   Drug use: No   Sexual activity: Not on file  Other Topics Concern   Not on file  Social History Narrative   Not on file   Social Determinants of Health   Financial Resource Strain: Not on file  Food Insecurity: Not on file  Transportation Needs:  Not on file  Physical Activity: Not on file  Stress: Not on file  Social Connections: Not on file   Family History  Problem Relation Age of Onset   Cancer Mother    Heart attack Father    Coronary artery disease Father    Allergies  Allergen Reactions   Jardiance [Empagliflozin]    Linagliptin     tradjenta   Metformin And Related    Prior to Admission medications   Medication Sig Start Date End Date Taking? Authorizing Provider  albuterol (VENTOLIN HFA) 108 (90 Base) MCG/ACT inhaler Inhale 1-2 puffs into the lungs every 6 (six) hours as needed for wheezing or shortness of breath. 01/22/21   Kozlow, Alvira Philips, MD  ascorbic acid (VITAMIN C) 1000 MG tablet Take by mouth.    [provider]  atorvastatin (LIPITOR) 10 MG tablet  12/27/17   [provider]  Biotin 1 MG CAPS Take by mouth.    [provider]  Cholecalciferol (VITAMIN D PO) Take 3,000 Units by mouth daily.     [provider]  diltiazem (CARDIZEM CD) 180 MG 24 hr  capsule  12/28/17   [provider]  diltiazem (DILACOR XR) 180 MG 24 hr capsule Take 180 mg by mouth daily.    [provider]  famotidine (PEPCID) 40 MG tablet TAKE ONE TABLET AT BEDTIME. 01/08/21   Kozlow, Alvira Philips, MD  famotidine (PEPCID) 40 MG tablet Take 1 tablet (40 mg total) by mouth daily. 01/22/21   Kozlow, Alvira Philips, MD  furosemide (LASIX) 40 MG tablet Take 40 mg by mouth every other day. 08/31/16   [provider]  glucose blood (ONETOUCH VERIO) test strip CHECK BLOOD SUGAR TWICE DAILY AS DIRECTED.    [provider]  guaiFENesin (MUCINEX) 600 MG 12 hr tablet Take by mouth 2 (two) times daily as needed.    [provider]  Insulin Glargine (BASAGLAR KWIKPEN) 100 UNIT/ML Inject 12 Units into the skin daily.    [provider]  Lancets MISC twice a day as directed 03/16/20   [provider]  mometasone (ASMANEX, 60 METERED DOSES,) 220 MCG/INH inhaler Inhale 1-2 puffs into  the lungs daily. 01/22/21   Kozlow, Alvira Philips, MD  Multiple Vitamins-Minerals (MULTI COMPLETE PO) Take 1 tablet by mouth daily.     [provider]  omeprazole (PRILOSEC OTC) 20 MG tablet Take 1 tablet (20 mg total) by mouth daily. 01/22/21   Kozlow, Alvira Philips, MD  omeprazole (PRILOSEC) 20 MG capsule TAKE 1 CAPSULE DAILY. 10/31/19   Kozlow, Alvira Philips, MD  polyethylene glycol powder (MIRALAX) 17 GM/SCOOP powder Use 1-2 caps in 8 oz liquid up to 3 times a day as needed until you are having regular bowel movements. 05/15/20   Caccavale, Sophia, PA-C  Potassium Chloride CR (MICRO-K) 8 MEQ CPCR capsule CR Take 8 mEq by mouth daily.     [provider]  predniSONE (STERAPRED UNI-PAK 21 TAB) 10 MG (21) TBPK tablet Take by mouth as needed.    [provider]  sitaGLIPtin (JANUVIA) 50 MG tablet Take 50 mg by mouth daily.    [provider]  SURE COMFORT PEN NEEDLES 31G X 8 MM MISC SMARTSIG:Injection 11/21/20   [provider]  telmisartan (MICARDIS) 80 MG tablet Take 80 mg by mouth daily.    [provider]     Positive ROS: Otherwise negative  All other systems have been reviewed and were otherwise negative with the exception of those mentioned in the HPI and as above.  Physical Exam: Constitutional: Alert, well-appearing, no acute distress Ears: External ears without lesions or tenderness. Ear canals are clear bilaterally with intact, clear TMs.  Nasal: External nose without lesions.. Clear nasal passages Oral: Lips and gums without lesions. Tongue is normal to evaluation on both sides with no mucosal lesions noted.  Tongue is soft to palpation.  Retromolar trigone region as well as buccal mucosa was normal.  Palate mucosa was clear.  She is status post tonsillectomy and.  Indirect endoscopy revealed a clear base of tongue vallecula and epiglottis.  Vocal cords were clear and hypopharynx was clear. Neck: No palpable adenopathy or masses Respiratory: Breathing  comfortably  Skin: No facial/neck lesions or rash noted.  Procedures  Assessment: Tongue soreness which has resolved with normal examination in the office today with no mucosal lesions noted.  Plan: Reassured patient of normal oral examination and nothing else needs to be done at this point. She will follow-up as needed   Narda Bonds, MD   CC:

## 2021-05-02 ENCOUNTER — Other Ambulatory Visit: Payer: Self-pay | Admitting: Student

## 2021-05-02 DIAGNOSIS — M5440 Lumbago with sciatica, unspecified side: Secondary | ICD-10-CM

## 2021-05-09 ENCOUNTER — Ambulatory Visit
Admission: RE | Admit: 2021-05-09 | Discharge: 2021-05-09 | Disposition: A | Discharge status: Home / Self Care | Payer: Medicare Other | Source: Ambulatory Visit | Attending: Student | Admitting: Student

## 2021-05-09 DIAGNOSIS — M5440 Lumbago with sciatica, unspecified side: Secondary | ICD-10-CM

## 2021-05-09 DIAGNOSIS — M47817 Spondylosis without myelopathy or radiculopathy, lumbosacral region: Secondary | ICD-10-CM | POA: Diagnosis not present

## 2021-05-09 MED ORDER — METHYLPREDNISOLONE ACETATE 40 MG/ML INJ SUSP (RADIOLOG
80.0000 mg | Freq: Once | INTRAMUSCULAR | Status: AC
  Administered 2021-05-09: 80 mg via EPIDURAL

## 2021-05-09 MED ORDER — IOPAMIDOL (ISOVUE-M 200) INJECTION 41%
1.0000 mL | Freq: Once | INTRAMUSCULAR | Status: AC
  Administered 2021-05-09: 1 mL via EPIDURAL

## 2021-05-09 NOTE — Discharge Instructions (Signed)

## 2021-05-17 DIAGNOSIS — N764 Abscess of vulva: Secondary | ICD-10-CM | POA: Diagnosis not present

## 2021-06-18 DIAGNOSIS — R300 Dysuria: Secondary | ICD-10-CM | POA: Diagnosis not present

## 2021-08-15 ENCOUNTER — Other Ambulatory Visit: Payer: Self-pay | Admitting: Student

## 2021-08-15 DIAGNOSIS — M5440 Lumbago with sciatica, unspecified side: Secondary | ICD-10-CM

## 2021-08-16 DIAGNOSIS — L739 Follicular disorder, unspecified: Secondary | ICD-10-CM | POA: Diagnosis not present

## 2021-08-22 ENCOUNTER — Ambulatory Visit
Admission: RE | Admit: 2021-08-22 | Discharge: 2021-08-22 | Disposition: A | Discharge status: Home / Self Care | Payer: Medicare Other | Source: Ambulatory Visit | Attending: Student | Admitting: Student

## 2021-08-22 DIAGNOSIS — M47817 Spondylosis without myelopathy or radiculopathy, lumbosacral region: Secondary | ICD-10-CM | POA: Diagnosis not present

## 2021-08-22 DIAGNOSIS — M5440 Lumbago with sciatica, unspecified side: Secondary | ICD-10-CM

## 2021-08-22 MED ORDER — METHYLPREDNISOLONE ACETATE 40 MG/ML INJ SUSP (RADIOLOG
80.0000 mg | Freq: Once | INTRAMUSCULAR | Status: AC
  Administered 2021-08-22: 80 mg via EPIDURAL

## 2021-08-22 MED ORDER — IOPAMIDOL (ISOVUE-M 200) INJECTION 41%
1.0000 mL | Freq: Once | INTRAMUSCULAR | Status: AC
  Administered 2021-08-22: 1 mL via EPIDURAL

## 2021-08-22 NOTE — Discharge Instructions (Signed)

## 2021-09-07 ENCOUNTER — Other Ambulatory Visit: Payer: Self-pay | Admitting: Allergy and Immunology

## 2021-10-09 DIAGNOSIS — I10 Essential (primary) hypertension: Secondary | ICD-10-CM | POA: Diagnosis not present

## 2021-10-09 DIAGNOSIS — E1169 Type 2 diabetes mellitus with other specified complication: Secondary | ICD-10-CM | POA: Diagnosis not present

## 2021-10-09 DIAGNOSIS — J4530 Mild persistent asthma, uncomplicated: Secondary | ICD-10-CM | POA: Diagnosis not present

## 2021-10-09 DIAGNOSIS — Z0000 Encounter for general adult medical examination without abnormal findings: Secondary | ICD-10-CM | POA: Diagnosis not present

## 2021-10-09 DIAGNOSIS — E7800 Pure hypercholesterolemia, unspecified: Secondary | ICD-10-CM | POA: Diagnosis not present

## 2021-10-09 DIAGNOSIS — Z1389 Encounter for screening for other disorder: Secondary | ICD-10-CM | POA: Diagnosis not present

## 2021-10-09 DIAGNOSIS — K219 Gastro-esophageal reflux disease without esophagitis: Secondary | ICD-10-CM | POA: Diagnosis not present

## 2021-10-09 DIAGNOSIS — H579 Unspecified disorder of eye and adnexa: Secondary | ICD-10-CM | POA: Diagnosis not present

## 2021-10-15 DIAGNOSIS — H5711 Ocular pain, right eye: Secondary | ICD-10-CM | POA: Diagnosis not present

## 2021-10-16 ENCOUNTER — Other Ambulatory Visit: Payer: Self-pay | Admitting: Ophthalmology

## 2021-10-16 DIAGNOSIS — H532 Diplopia: Secondary | ICD-10-CM

## 2021-10-16 DIAGNOSIS — R519 Headache, unspecified: Secondary | ICD-10-CM

## 2021-11-02 ENCOUNTER — Ambulatory Visit
Admission: RE | Admit: 2021-11-02 | Discharge: 2021-11-02 | Disposition: A | Discharge status: Home / Self Care | Payer: Medicare Other | Source: Ambulatory Visit | Attending: Ophthalmology | Admitting: Ophthalmology

## 2021-11-02 ENCOUNTER — Other Ambulatory Visit: Payer: Self-pay

## 2021-11-02 DIAGNOSIS — R519 Headache, unspecified: Secondary | ICD-10-CM | POA: Diagnosis not present

## 2021-11-02 DIAGNOSIS — H532 Diplopia: Secondary | ICD-10-CM

## 2021-11-02 DIAGNOSIS — I771 Stricture of artery: Secondary | ICD-10-CM | POA: Diagnosis not present

## 2021-11-02 DIAGNOSIS — R9082 White matter disease, unspecified: Secondary | ICD-10-CM | POA: Diagnosis not present

## 2021-11-02 MED ORDER — GADOBENATE DIMEGLUMINE 529 MG/ML IV SOLN
13.0000 mL | Freq: Once | INTRAVENOUS | Status: AC | PRN
  Administered 2021-11-02: 13 mL via INTRAVENOUS

## 2022-02-04 DIAGNOSIS — K0889 Other specified disorders of teeth and supporting structures: Secondary | ICD-10-CM | POA: Diagnosis not present

## 2022-02-04 DIAGNOSIS — G518 Other disorders of facial nerve: Secondary | ICD-10-CM | POA: Diagnosis not present

## 2022-02-07 ENCOUNTER — Ambulatory Visit: Payer: Medicare Other | Admitting: Internal Medicine

## 2022-02-28 ENCOUNTER — Ambulatory Visit: Payer: Medicare Other | Admitting: Internal Medicine

## 2022-03-05 DIAGNOSIS — H903 Sensorineural hearing loss, bilateral: Secondary | ICD-10-CM | POA: Diagnosis not present

## 2022-03-08 ENCOUNTER — Other Ambulatory Visit: Payer: Self-pay | Admitting: Allergy and Immunology

## 2022-03-31 DIAGNOSIS — M26609 Unspecified temporomandibular joint disorder, unspecified side: Secondary | ICD-10-CM | POA: Diagnosis not present

## 2022-03-31 DIAGNOSIS — K0889 Other specified disorders of teeth and supporting structures: Secondary | ICD-10-CM | POA: Diagnosis not present

## 2022-04-04 ENCOUNTER — Ambulatory Visit: Payer: Medicare Other | Admitting: Internal Medicine

## 2022-04-06 ENCOUNTER — Other Ambulatory Visit: Payer: Self-pay | Admitting: Allergy and Immunology

## 2022-04-07 ENCOUNTER — Other Ambulatory Visit: Payer: Self-pay | Admitting: Allergy and Immunology

## 2022-04-15 ENCOUNTER — Ambulatory Visit: Payer: Medicare Other | Admitting: Allergy and Immunology

## 2022-04-23 DIAGNOSIS — H52203 Unspecified astigmatism, bilateral: Secondary | ICD-10-CM | POA: Diagnosis not present

## 2022-04-27 NOTE — Patient Instructions (Incomplete)
  1. Continue Asmanex 220 1 inhalation 1 time per day. Rinse mouth out after  2. Continue Prilosec 20 mg in a.m. as needed + famotidine 40 mg in p.m. Continue dietary and lifestyle modifications as below  3. Continue Albuterol HFA 2 puffs every 4-6 hours if needed for cough, wheeze, tightness in chest, or shortness of breath.  4. Start prednisone 10 mg taking 1 tablet twice a day for 4 days, then on the 5th day take 1 tablet and stop. Check your blood sugar while taking prednisone.   5. Recommend contacting your primary care physician about starting Paxlovid due your positive Covid 19 test today while in the office and there not being an up to date GFR in Epic (our computer system). Reviewed isolation protocols  6. Return to clinic in 12 months or earlier if problem     Lifestyle Changes for Controlling GERD When you have GERD, stomach acid feels as if it's backing up toward your mouth. Whether or not you take medication to control your GERD, your symptoms can often be improved with lifestyle changes.   Raise Your Head Reflux is more likely to strike when you're lying down flat, because stomach fluid can flow backward more easily. Raising the head of your bed 4-6 inches can help. To do this: Slide blocks or books under the legs at the head of your bed. Or, place a wedge under the mattress. Many foam stores can make a suitable wedge for you. The wedge should run from your waist to the top of your head. Don't just prop your head on several pillows. This increases pressure on your stomach. It can make GERD worse.  Watch Your Eating Habits Certain foods may increase the acid in your stomach or relax the lower esophageal sphincter, making GERD more likely. It's best to avoid the following: Coffee, tea, and carbonated drinks (with and without caffeine) Fatty, fried, or spicy food Mint, chocolate, onions, and tomatoes Any other foods that seem to irritate your stomach or cause you  pain  Relieve the Pressure Eat smaller meals, even if you have to eat more often. Don't lie down right after you eat. Wait a few hours for your stomach to empty. Avoid tight belts and tight-fitting clothes. Lose excess weight.  Tobacco and Alcohol Avoid smoking tobacco and drinking alcohol. They can make GERD symptoms worse.

## 2022-04-28 ENCOUNTER — Encounter: Payer: Self-pay | Admitting: Family

## 2022-04-28 ENCOUNTER — Ambulatory Visit: Payer: Medicare Other | Admitting: Family

## 2022-04-28 VITALS — BP 150/70 | HR 77 | Temp 98.0°F | Resp 12 | Ht 59.45 in | Wt 133.2 lb

## 2022-04-28 DIAGNOSIS — J4530 Mild persistent asthma, uncomplicated: Secondary | ICD-10-CM

## 2022-04-28 DIAGNOSIS — K219 Gastro-esophageal reflux disease without esophagitis: Secondary | ICD-10-CM

## 2022-04-28 DIAGNOSIS — R051 Acute cough: Secondary | ICD-10-CM | POA: Diagnosis not present

## 2022-04-28 DIAGNOSIS — J3089 Other allergic rhinitis: Secondary | ICD-10-CM

## 2022-04-28 MED ORDER — ASMANEX (30 METERED DOSES) 220 MCG/ACT IN AEPB
INHALATION_SPRAY | RESPIRATORY_TRACT | 5 refills | Status: DC
Start: 2022-04-28 — End: 2022-07-25

## 2022-04-28 MED ORDER — FAMOTIDINE 40 MG PO TABS
ORAL_TABLET | ORAL | 5 refills | Status: DC
Start: 2022-04-28 — End: 2022-11-13

## 2022-04-28 MED ORDER — ALBUTEROL SULFATE HFA 108 (90 BASE) MCG/ACT IN AERS: 1.0000 | INHALATION_SPRAY | Freq: Four times a day (QID) | RESPIRATORY_TRACT | 1 refills | Status: DC | PRN

## 2022-04-28 NOTE — Progress Notes (Signed)
[place] [address] Dept: [phone]  FOLLOW UP NOTE  Patient ID: Rhonda Watkins, female    DOB: April 23, 1932  Age: 88 y.o. MRN: [id number] Date of Office Visit: 04/28/2022  Assessment  Chief Complaint: Follow-up (No problem until now. Forgot to take her inhaler 3 or 4 days in a row.) and Cough (Greenish mucous)  HPI Rhonda Watkins is an 88 year old female who presents today for follow-up of well-controlled mild persistent asthma, allergic rhinitis, laryngopharyngeal reflux disease.  She was last seen on January 22, 2021 by Dr. Neldon Mc.  She denies any new diagnosis or surgeries since her last office visit.  She does mention that she just got a phone call from the hospital. where her husband is right now, stating that he  tested positive for COVID-19.  She wears a hearing aid that is not functioning well today. She has a new one ordered. The office visit was done by taking notes for her to read and answer the question.  Mild persistent asthma: she continues to take Asmanex 220 mcg 1 puff once a day and albuterol as needed.  She reports a cough since Thursday.  She denies wheezing, tightness in her chest, shortness of breath, fever, chills, and nocturnal awakenings due to breathing problems.  Just today the cough has been productive with green sputum.  She is asking to be tested for COVID-19 due to her husband's recent positive test.  Since her last office visit she has not made any trips to the emergency room or urgent care due to breathing problems.  She has received steroids for her back, but not for breathing.  She has not had to use her albuterol any since we last saw her.  She has not tried using her albuterol for the cough she is having right now.  Discussed that her test to COVID-19 was positive today while in the office.  Instructed her that she needed to remain in isolation for 5 days and for 5 days after that wear a mask when out and about in public.  Also instructed her that  we do not have any current lab work showing her GFR.  Her last lab work was from 2021.  If she is interested in starting Paxlovid due to her being on day 5 of symptoms she would need to contact her primary care physician who has her up-to-date lab work.  She does not feel like she needs anything at this time.  Instructed her to still call to make sure.  Allergic rhinitis: She denies rhinorrhea, nasal congestion and post nasal drip. She has not had any sinus infections since we last saw her.  Laryngopharyngeal reflux disease: She continues to take famotidine 40 mg at night and Prilosec as needed. She reports reflux symptoms only if she drinks too much coffee in the morning.   Drug Allergies:  Allergies  Allergen Reactions   Jardiance [Empagliflozin]    Linagliptin     tradjenta   Metformin And Related     Review of Systems: Review of Systems  Constitutional:  Negative for chills and fever.  HENT:         Denies rhinorrhea, nasal congestion, and post nasal drip  Respiratory:  Positive for cough. Negative for shortness of breath and wheezing.        Reports cough that started Thursday and is not getting any better. Cough was productive today with green sputum  Cardiovascular:  Positive for palpitations. Negative for chest pain.  Reports that it felt like her heart was racing earlier today. Instructed her to contact her primary care physician if this occurs again  Gastrointestinal:        Reports reflux symptoms only if she drinks too much coffee in the morning  Genitourinary:  Negative for frequency.  Skin:  Negative for itching and rash.  Neurological:  Negative for headaches.  Endo/Heme/Allergies:  Positive for environmental allergies.     Physical Exam: BP (!) 150/70   Pulse 77   Temp 98 F (36.7 C) (Temporal)   Resp 12   Ht 4' 11.45" (1.51 m)   Wt 133 lb 3.2 oz (60.4 kg)   SpO2 98%   BMI 26.50 kg/m    Physical Exam Constitutional:      Appearance: Normal  appearance.     Comments: Wearing hearing aid in right ear. Very hard of hearing  HENT:     Head: Normocephalic and atraumatic.     Right Ear: Tympanic membrane, ear canal and external ear normal.     Left Ear: Tympanic membrane, ear canal and external ear normal.     Nose: Nose normal.     Mouth/Throat:     Mouth: Mucous membranes are moist.     Pharynx: Oropharynx is clear.  Eyes:     Conjunctiva/sclera: Conjunctivae normal.  Cardiovascular:     Rate and Rhythm: Regular rhythm.     Heart sounds: Normal heart sounds.  Pulmonary:     Effort: Pulmonary effort is normal.     Breath sounds: Normal breath sounds.     Comments: Lungs clear to auscultation Musculoskeletal:     Cervical back: Neck supple.  Skin:    General: Skin is warm.  Neurological:     Mental Status: She is alert and oriented to person, place, and time.  Psychiatric:        Mood and Affect: Mood normal.        Behavior: Behavior normal.        Thought Content: Thought content normal.        Judgment: Judgment normal.     Diagnostics:  Your rapid Covid-19 test today was positive.  We will get spirometry at your next office visit.  Assessment and Plan: 1. Mild persistent asthma without complication   2. Acute cough   3. LPRD (laryngopharyngeal reflux disease)   4. Other allergic rhinitis     Meds ordered this encounter  Medications   albuterol (VENTOLIN HFA) 108 (90 Base) MCG/ACT inhaler    Sig: Inhale 1-2 puffs into the lungs every 6 (six) hours as needed for wheezing or shortness of breath.    Dispense:  18 g    Refill:  1    2 puffs every 4-6 hours if needed   mometasone (ASMANEX, 30 METERED DOSES,) 220 MCG/ACT inhaler    Sig: INHALE 1 PUFF INTO LUNGS DAILY. Rinse mouth out afterwards    Dispense:  1 each    Refill:  5   famotidine (PEPCID) 40 MG tablet    Sig: TAKE ONE TABLET IN THE EVENING    Dispense:  30 tablet    Refill:  5    Patient Instructions   1. Continue Asmanex 220 1  inhalation 1 time per day. Rinse mouth out after  2. Continue Prilosec 20 mg in a.m. as needed + famotidine 40 mg in p.m. Continue dietary and lifestyle modifications as below  3. Continue Albuterol HFA 2 puffs every 4-6 hours if needed for  cough, wheeze, tightness in chest, or shortness of breath.  4. Start prednisone 10 mg taking 1 tablet twice a day for 4 days, then on the 5th day take 1 tablet and stop. Check your blood sugar while taking prednisone.   5. Recommend contacting your primary care physician about starting Paxlovid due your positive Covid 19 test today while in the office and there not being an up to date GFR in Epic (our computer system). Reviewed isolation protocols  6. Return to clinic in 12 months or earlier if problem     Lifestyle Changes for Controlling GERD When you have GERD, stomach acid feels as if it's backing up toward your mouth. Whether or not you take medication to control your GERD, your symptoms can often be improved with lifestyle changes.   Raise Your Head Reflux is more likely to strike when you're lying down flat, because stomach fluid can flow backward more easily. Raising the head of your bed 4-6 inches can help. To do this: Slide blocks or books under the legs at the head of your bed. Or, place a wedge under the mattress. Many foam stores can make a suitable wedge for you. The wedge should run from your waist to the top of your head. Don't just prop your head on several pillows. This increases pressure on your stomach. It can make GERD worse.  Watch Your Eating Habits Certain foods may increase the acid in your stomach or relax the lower esophageal sphincter, making GERD more likely. It's best to avoid the following: Coffee, tea, and carbonated drinks (with and without caffeine) Fatty, fried, or spicy food Mint, chocolate, onions, and tomatoes Any other foods that seem to irritate your stomach or cause you pain  Relieve the Pressure Eat  smaller meals, even if you have to eat more often. Don't lie down right after you eat. Wait a few hours for your stomach to empty. Avoid tight belts and tight-fitting clothes. Lose excess weight.  Tobacco and Alcohol Avoid smoking tobacco and drinking alcohol. They can make GERD symptoms worse.   Return in about 1 year (around 04/29/2023), or if symptoms worsen or fail to improve.    Thank you for the opportunity to care for this patient.  Please do not hesitate to contact me with questions.  Althea Charon, FNP [facility]

## 2022-04-29 ENCOUNTER — Telehealth: Payer: Self-pay | Admitting: Internal Medicine

## 2022-04-29 ENCOUNTER — Ambulatory Visit: Payer: Medicare Other | Admitting: Allergy & Immunology

## 2022-04-29 MED ORDER — NIRMATRELVIR/RITONAVIR (PAXLOVID)TABLET
3.0000 | ORAL_TABLET | Freq: Two times a day (BID) | ORAL | 0 refills | Status: DC
Start: 2022-04-29 — End: 2022-04-29

## 2022-04-29 MED ORDER — NIRMATRELVIR/RITONAVIR (PAXLOVID)TABLET: 3.0000 | ORAL_TABLET | Freq: Two times a day (BID) | ORAL | 0 refills | Status: AC

## 2022-04-29 NOTE — Addendum Note (Signed)
Addended byHarlon Flor on: 04/29/2022 09:09 AM   Modules accepted: Orders

## 2022-04-29 NOTE — Telephone Encounter (Signed)
Pharmacy ([facility]) called wanting to know if patient tested positive in office and if so if prescription for paxlovid could be sent in. Patient was extremely upset and at the pharmacy wanting prescription to be sent in.   Advised pharmacy patient did test positive in office and that one of our other providers could send in medication. Pharmacy states they cannot order paxlovid but prescription could be sent in to [facility] - [address] [place], [place] [address]  Dr. Posey Pronto sent in prescription for paxlovid. Pharmacy advised.

## 2022-04-29 NOTE — Addendum Note (Signed)
Addended byHarlon Flor on: 04/29/2022 09:12 AM   Modules accepted: Orders

## 2022-04-29 NOTE — Telephone Encounter (Addendum)
Positive COVID test, will send in Paxlovid for 5 days.  Patient is 88yo with no hx of renal disease.  Last eGFR>60 on 05/15/2020.

## 2022-05-02 DIAGNOSIS — N390 Urinary tract infection, site not specified: Secondary | ICD-10-CM | POA: Diagnosis not present

## 2022-05-02 NOTE — Telephone Encounter (Signed)
Pt states she has 6 days of the paxlovid, directions say take it for 5 days. Is it okay to take the 6th dose.

## 2022-06-10 ENCOUNTER — Ambulatory Visit: Payer: Medicare Other | Admitting: Allergy & Immunology

## 2022-06-10 ENCOUNTER — Encounter: Payer: Self-pay | Admitting: Allergy & Immunology

## 2022-06-10 VITALS — BP 128/78 | HR 70 | Temp 97.4°F | Resp 16 | Ht 62.0 in | Wt 132.2 lb

## 2022-06-10 DIAGNOSIS — J4530 Mild persistent asthma, uncomplicated: Secondary | ICD-10-CM

## 2022-06-10 DIAGNOSIS — J3089 Other allergic rhinitis: Secondary | ICD-10-CM | POA: Diagnosis not present

## 2022-06-10 DIAGNOSIS — K219 Gastro-esophageal reflux disease without esophagitis: Secondary | ICD-10-CM

## 2022-06-10 DIAGNOSIS — J302 Other seasonal allergic rhinitis: Secondary | ICD-10-CM

## 2022-06-10 NOTE — Progress Notes (Signed)
FOLLOW UP  Date of Service/Encounter:  06/10/22   Assessment:   Mild persistent asthma, uncomplicated  Laryngopharyngeal reflux disease  Allergic rhinitis  Recent loss of her husband from [place] (October 2023)  Plan/Recommendations:   1. Mild persistent asthma without complication - Lung testing looks good today. - We are not going to make any medication changes at this time. - Daily controller medication(s): Asmanex Twisthaler 260mg 1 puffs once daily - Prior to physical activity: albuterol 2 puffs 10-15 minutes before physical activity. - Rescue medications: albuterol 4 puffs every 4-6 hours as needed - Changes during respiratory infections or worsening symptoms: Increase Asmanex to 2 puffs twice daily for TWO WEEKS. - Asthma control goals:  * Full participation in all desired activities (may need albuterol before activity) * Albuterol use two time or less a week on average (not counting use with activity) * Cough interfering with sleep two time or less a month * Oral steroids no more than once a year * No hospitalizations  2. LPRD (laryngopharyngeal reflux disease) - Continue with Prilosec daily. - Continue with famotidine daily.   3. Seasonal and perennial allergic rhinitis - Continue with nothing since you are doing so well.   4. Return in about 1 year (around 06/11/2023).   Subjective:   Rhonda MCEVERSis a 88y.o. female presenting today for follow up of  Chief Complaint  Patient presents with   Asthma    Feels a little short of breath today    Lace J Groll has a history of the following: Patient Active Problem List   Diagnosis Date Noted   Age-related osteoporosis without current pathological fracture 12/24/2020   Hyperglycemia due to type 2 diabetes mellitus ([facility] 12/24/2020   Mild persistent asthma, uncomplicated 074/94/4967  Type 2 diabetes mellitus with other specified complication ([facility] [place] 059/16/3846  Body mass index (BMI) 25.0-25.9, adult  03/09/2020   Lumbago of lumbar region with sciatica 07/14/2019   Bilateral hand pain 06/26/2017   Ganglion cyst of flexor tendon sheath of finger of right hand 06/26/2017   Primary osteoarthritis of both hands 06/26/2017   Trigger ring finger of right hand 06/26/2017   Ingrown right big toenail 05/08/2016   Displacement of cervical intervertebral disc without myelopathy 03/01/2015   Bursitis 01/30/2015   HNP (herniated nucleus pulposus), lumbar 01/12/2015   Displacement of lumbar intervertebral disc without myelopathy 02/16/2014   Essential hypertension 09/17/2013   Mitral regurgitation 09/17/2013   GERD (gastroesophageal reflux disease) 09/17/2013   Spinal stenosis 09/17/2013   Hypercholesterolemia 09/17/2013    History obtained from: chart review and patient.  Rhonda Watkins is a 88y.o. female presenting for a follow up visit.  I have never met her previously.  She was last seen in September 2023 by CAlthea Charon one of our nurse practitioners.  At that time, we continued Asmanex 220 mcg 1 puff once daily.  She was also continued on Prilosec as well as albuterol as needed.  She was started on a prednisone burst.  It was recommended that she talk to her PCP about treatment for COVID-19.  Since the last visit, she has had a hard time. Her husband caught COVID19 and died on ONovember 04, 2024 He apparently fell and went in to the hospital for some fractures. Then he contracted COVID19 in the hospital and he ended up passing from that. She did take Paxlovid from her PCP and she recovered.   Asthma/Respiratory Symptom History: Her breathing during this time has been stable.  She did take Ventolin because she was feeling SOB. But she has not taken it for 10 years or so.  She remains on her Asmanex one puff in the mornings. She has had bronchitis once in 10 years.   GERD Symptom History: She is on Prilosec and famotidine for a number of years. She has been on this for 8-10 years from Dr. Neldon Mc.   She is  currently living by herself. She is going to be moving into a more of an assisted living situation in May 2024. One of her grandsons is getting married soon and this is taking up a lot of the family's time. But once that is over, they are going to work on transitioning her into assisted living.  Otherwise, there have been no changes to her past medical history, surgical history, family history, or social history.    Review of Systems  Constitutional: Negative.  Negative for chills, fever, malaise/fatigue and weight loss.  HENT: Negative.  Negative for congestion, ear discharge and ear pain.   Eyes:  Negative for pain, discharge and redness.  Respiratory:  Negative for cough, sputum production, shortness of breath and wheezing.   Cardiovascular: Negative.  Negative for chest pain and palpitations.  Gastrointestinal:  Negative for abdominal pain, constipation, diarrhea, heartburn, nausea and vomiting.  Skin: Negative.  Negative for itching and rash.  Neurological:  Negative for dizziness and headaches.  Endo/Heme/Allergies:  Negative for environmental allergies. Does not bruise/bleed easily.       Objective:   Blood pressure 128/78, pulse 70, temperature (!) 97.4 F (36.3 C), resp. rate 16, height _0  (1.575 m), weight 132 lb 3.2 oz (60 kg), SpO2 97 %. Body mass index is 24.18 kg/m.    Physical Exam Vitals reviewed.  Constitutional:      Appearance: She is well-developed.     Comments: Talkative.  HENT:     Head: Normocephalic and atraumatic.     Right Ear: Tympanic membrane, ear canal and external ear normal.     Left Ear: Tympanic membrane, ear canal and external ear normal.     Nose: No nasal deformity, septal deviation, mucosal edema or rhinorrhea.     Right Turbinates: Enlarged, swollen and pale.     Left Turbinates: Enlarged, swollen and pale.     Right Sinus: No maxillary sinus tenderness or frontal sinus tenderness.     Left Sinus: No maxillary sinus tenderness or  frontal sinus tenderness.     Mouth/Throat:     Mouth: Mucous membranes are not pale and not dry.     Pharynx: Uvula midline.  Eyes:     General: Lids are normal. No allergic shiner.       Right eye: No discharge.        Left eye: No discharge.     Conjunctiva/sclera: Conjunctivae normal.     Right eye: Right conjunctiva is not injected. No chemosis.    Left eye: Left conjunctiva is not injected. No chemosis.    Pupils: Pupils are equal, round, and reactive to light.  Cardiovascular:     Rate and Rhythm: Normal rate and regular rhythm.     Heart sounds: Normal heart sounds.  Pulmonary:     Effort: Pulmonary effort is normal. No tachypnea, accessory muscle usage or respiratory distress.     Breath sounds: Normal breath sounds. No wheezing, rhonchi or rales.     Comments: Moving air well in all lung fields. No increased work of breathing noted.  Chest:  Chest wall: No tenderness.  Lymphadenopathy:     Cervical: No cervical adenopathy.  Skin:    Coloration: Skin is not pale.     Findings: No abrasion, erythema, petechiae or rash. Rash is not papular, urticarial or vesicular.  Neurological:     Mental Status: She is alert.  Psychiatric:        Behavior: Behavior is cooperative.      Diagnostic studies:    Spirometry: results normal (FEV1: 2.10/128%, FVC: 2.88/133%, FEV1/FVC: 73%).    Spirometry consistent with normal pattern.   Allergy Studies: none       Salvatore Marvel, MD  [facility]

## 2022-06-10 NOTE — Patient Instructions (Addendum)
1. Mild persistent asthma without complication - Lung testing looks good today. - We are not going to make any medication changes at this time. - Daily controller medication(s): Asmanex Twisthaler 219mcg 1 puffs once daily - Prior to physical activity: albuterol 2 puffs 10-15 minutes before physical activity. - Rescue medications: albuterol 4 puffs every 4-6 hours as needed - Changes during respiratory infections or worsening symptoms: Increase Asmanex to 2 puffs twice daily for TWO WEEKS. - Asthma control goals:  * Full participation in all desired activities (may need albuterol before activity) * Albuterol use two time or less a week on average (not counting use with activity) * Cough interfering with sleep two time or less a month * Oral steroids no more than once a year * No hospitalizations  2. LPRD (laryngopharyngeal reflux disease) - Continue with Prilosec daily. - Continue with famotidine daily.   3. Seasonal and perennial allergic rhinitis - Continue with nothing since you are doing so well.   4. Return in about 1 year (around 06/11/2023).    Please inform us of any Emergency Department visits, hospitalizations, or changes in symptoms. Call us before going to the ED for breathing or allergy symptoms since we might be able to fit you in for a sick visit. Feel free to contact us anytime with any questions, problems, or concerns.  It was a pleasure to meet you today! I am SO sorry to hear about your husband's passing.   Websites that have reliable patient information: 1. American Academy of Asthma, Allergy, and Immunology: www.aaaai.org 2. Food Allergy Research and Education (FARE): foodallergy.org 3. Mothers of Asthmatics: http://www.asthmacommunitynetwork.org 4. American College of Allergy, Asthma, and Immunology: www.acaai.org   COVID-19 Vaccine Information can be found at: ShippingScam.co.uk For questions related  to vaccine distribution or appointments, please email [email or web address] [email or web address] or call [phone].   We realize that you might be concerned about having an allergic reaction to the COVID19 vaccines. To help with that concern, WE ARE OFFERING THE COVID19 VACCINES IN OUR OFFICE! Ask the front desk for dates!     "Like" [place] on Facebook and Instagram for our latest updates!      A healthy democracy works best when [employer] participate! Make sure you are registered to vote! If you have moved or changed any of your contact information, you will need to get this updated before voting!  In some cases, you MAY be able to register to vote online: [email or web address]

## 2022-06-17 DIAGNOSIS — K219 Gastro-esophageal reflux disease without esophagitis: Secondary | ICD-10-CM | POA: Diagnosis not present

## 2022-06-17 DIAGNOSIS — E1169 Type 2 diabetes mellitus with other specified complication: Secondary | ICD-10-CM | POA: Diagnosis not present

## 2022-06-17 DIAGNOSIS — E7800 Pure hypercholesterolemia, unspecified: Secondary | ICD-10-CM | POA: Diagnosis not present

## 2022-06-17 DIAGNOSIS — I10 Essential (primary) hypertension: Secondary | ICD-10-CM | POA: Diagnosis not present

## 2022-07-12 ENCOUNTER — Ambulatory Visit: Payer: Medicare Other | Admitting: Podiatry

## 2022-07-12 DIAGNOSIS — L603 Nail dystrophy: Secondary | ICD-10-CM

## 2022-07-12 DIAGNOSIS — M2042 Other hammer toe(s) (acquired), left foot: Secondary | ICD-10-CM | POA: Diagnosis not present

## 2022-07-12 DIAGNOSIS — M2041 Other hammer toe(s) (acquired), right foot: Secondary | ICD-10-CM

## 2022-07-12 NOTE — Patient Instructions (Signed)
I have ordered a medication for you that will come from [facility] in [place]. They should be calling you to verify insurance and will mail the medication to you. If you live close by then you can go by their pharmacy to pick up the medication. Their phone number is [phone]. If you do not hear from them in the next few days, please give us a call at [phone].   

## 2022-07-12 NOTE — Progress Notes (Unsigned)
Subjective:   Patient ID: Rhonda Watkins, female   DOB: 88 y.o.   MRN: [id number]   HPI Chief Complaint  Patient presents with   Nail Problem    Right hallux nail and left 2nd toe nails are thick and yellow in color, patient denies any pain    88 year old female presents the office with above concerns.  She has no pain with the nails there is no significant discolored.  She also has a hammertoe on the left second toe.  No recent injuries to her feet.  No drainage or pus.  No open lesions.  No other concerns.   Review of Systems  All other systems reviewed and are negative.       Objective:  Physical Exam  General: AAO x3, NAD  Dermatological: Right hallux as well as left second digit nail hypertrophic, dystrophic brown discoloration.  There is no edema, erythema.  Nails appear to be quite damaged.  There is no drainage or pus from the toenail sites.  No open lesions.  Vascular: Dorsalis Pedis artery and Posterior Tibial artery pedal pulses are 2/4 bilateral with immedate capillary fill time.There is no pain with calf compression, swelling, warmth, erythema.   Neruologic: Grossly intact via light touch bilateral.   Musculoskeletal: Hammertoes present most notably the left second toe.  Gait: Unassisted, Nonantalgic.       Assessment:   88 year old female with onychodystrophy, hammertoes     Plan:  Sharp debrided the nails without any complications or bleeding x 2.  We discussed urea nail gel to help with the thickening the nail.  Given the hammertoes we discussed shoe modifications, offloading.  Dispensed toe crest.  Vivi Barrack DPM

## 2022-07-17 ENCOUNTER — Other Ambulatory Visit: Payer: Self-pay | Admitting: Allergy and Immunology

## 2022-07-17 DIAGNOSIS — R350 Frequency of micturition: Secondary | ICD-10-CM | POA: Diagnosis not present

## 2022-07-25 ENCOUNTER — Ambulatory Visit (INDEPENDENT_AMBULATORY_CARE_PROVIDER_SITE_OTHER): Payer: Medicare Other | Admitting: Internal Medicine

## 2022-07-25 ENCOUNTER — Encounter: Payer: Self-pay | Admitting: Internal Medicine

## 2022-07-25 DIAGNOSIS — K219 Gastro-esophageal reflux disease without esophagitis: Secondary | ICD-10-CM | POA: Diagnosis not present

## 2022-07-25 DIAGNOSIS — J4530 Mild persistent asthma, uncomplicated: Secondary | ICD-10-CM | POA: Diagnosis not present

## 2022-07-25 DIAGNOSIS — J302 Other seasonal allergic rhinitis: Secondary | ICD-10-CM | POA: Diagnosis not present

## 2022-07-25 DIAGNOSIS — J4531 Mild persistent asthma with (acute) exacerbation: Secondary | ICD-10-CM

## 2022-07-25 MED ORDER — FLUTICASONE PROPIONATE 50 MCG/ACT NA SUSP: 2.0000 | Freq: Every day | NASAL | 5 refills | Status: DC

## 2022-07-25 MED ORDER — ALBUTEROL SULFATE HFA 108 (90 BASE) MCG/ACT IN AERS: 1.0000 | INHALATION_SPRAY | Freq: Four times a day (QID) | RESPIRATORY_TRACT | 1 refills | Status: DC | PRN

## 2022-07-25 MED ORDER — ASMANEX (30 METERED DOSES) 220 MCG/ACT IN AEPB: INHALATION_SPRAY | RESPIRATORY_TRACT | 5 refills | Status: DC

## 2022-07-25 NOTE — Progress Notes (Signed)
RE: Rhonda Watkins MRN: [id number] DOB: 08-15-1931 Date of Telemedicine Visit: 07/25/2022  Referring provider: Emilio Aspen, * Primary care provider: Emilio Aspen, MD  Chief Complaint: Asthma (Has not had any asthma issues for about 15 years last night having trouble taking deep breaths had Covid 2-3 months)   Telemedicine Follow Up Visit via Telephone: I connected with [name] for a follow up on 07/25/22 by telephone and verified that I am speaking with the correct person using two identifiers.   I discussed the limitations, risks, security and privacy concerns of performing an evaluation and management service by telephone and the availability of in person appointments. I also discussed with the patient that there may be a patient responsible charge related to this service. The patient expressed understanding and agreed to proceed.  Patient is at home accompanied by herself who provided/contributed to the history.  Provider is at the office.  Visit start time: 4:08 PM Visit end time: 4:23 PM Insurance consent/check in by: front desk Medical consent and medical assistant/nurse: Lachelle  History of Present Illness:  She is a 88 y.o. female, who is being followed for mild persistent asthma, LPR, allergic rhinitis. Her previous allergy office visit was in 05/2022 with  Dr. Dellis Anes  .   Reports her asthma has done very well, almost for the past 15 years she has not had issues.  About 2-3 months ago, she did have COVID and had to use her albuterol a few times but no other issues.  The past few days, she has noticed more trouble breathing and wonders if it might even be a little better today.  No coughing/wheezing.  She has not used her albuterol.   She is taking Asmanex 1 puff daily.   She has also noticed a little bit of phlegm and runny nose in the morning but no congestion, fevers, sinus pain.  She is not using any nose sprays.  She also reports having  transportation issues which is why she couldn't make it to clinic today.   Otherwise, there have been no changes to her past medical history, surgical history, family history, or social history.  Assessment and Plan:  [name] is a 88 y.o. female with:  Mild persistent asthma with acute exacerbation  Seasonal and perennial allergic rhinitis  LPRD (laryngopharyngeal reflux disease)  1. Mild persistent asthma, possible mild exacerbation.  - We will temporarily increase Asmanex 2 puffs twice daily for 1 week as her symptoms are mild.  If no improvement, we will need to reassess in office and possibly consider a course of oral prednisone.  Did inform her that if her breathing status worsens or if she develops fevers/chills/body aches, she needs to be evaluated in urgent care.   - Daily controller medication(s): Asmanex Twisthaler 1 puffs once daily - Prior to physical activity: albuterol 2 puffs 10-15 minutes before physical activity. - Rescue medications: albuterol 4 puffs every 4-6 hours as needed - Changes during respiratory infections or worsening symptoms: Increase Asmanex to 2 puffs twice daily for TWO WEEKS. - Asthma control goals:  * Full participation in all desired activities (may need albuterol before activity) * Albuterol use two time or less a week on average (not counting use with activity) * Cough interfering with sleep two time or less a month * Oral steroids no more than once a year * No hospitalizations  2. LPRD (laryngopharyngeal reflux disease) - Continue with Prilosec daily. - Continue with famotidine daily.  3. Seasonal and perennial allergic rhinitis - Use nasal saline rinses before nose sprays such as with Neilmed Sinus Rinse.  Use distilled water.   - Use Flonase 2 sprays each nostril daily. Aim upward and outward.   Diagnostics: None.  Medication List:  Current Outpatient Medications  Medication Sig Dispense Refill   albuterol (VENTOLIN HFA)  108 (90 Base) MCG/ACT inhaler Inhale 1-2 puffs into the lungs every 6 (six) hours as needed for wheezing or shortness of breath. 18 g 1   atorvastatin (LIPITOR) 10 MG tablet   2   Cholecalciferol (VITAMIN D PO) Take 3,000 Units by mouth daily.      diltiazem (DILACOR XR) 180 MG 24 hr capsule Take 180 mg by mouth daily.     famotidine (PEPCID) 40 MG tablet TAKE ONE TABLET IN THE EVENING 30 tablet 5   furosemide (LASIX) 40 MG tablet Take 40 mg by mouth every other day.  2   glucose blood (ONETOUCH VERIO) test strip CHECK BLOOD SUGAR TWICE DAILY AS DIRECTED.     guaiFENesin (MUCINEX) 600 MG 12 hr tablet Take by mouth 2 (two) times daily as needed.     Insulin Glargine (BASAGLAR KWIKPEN) 100 UNIT/ML Inject 12 Units into the skin daily.     Lancets MISC twice a day as directed     mometasone (ASMANEX, 30 METERED DOSES,) 220 MCG/ACT inhaler INHALE 1 PUFF INTO LUNGS DAILY. Rinse mouth out afterwards 1 each 5   omeprazole (PRILOSEC) 20 MG capsule TAKE ONE CAPSULE IN THE MORNING 30 capsule 5   polyethylene glycol powder (MIRALAX) 17 GM/SCOOP powder Use 1-2 caps in 8 oz liquid up to 3 times a day as needed until you are having regular bowel movements. 255 g 0   Potassium Chloride CR (MICRO-K) 8 MEQ CPCR capsule CR Take 8 mEq by mouth daily.      SURE COMFORT PEN NEEDLES 31G X 8 MM MISC SMARTSIG:Injection     telmisartan (MICARDIS) 80 MG tablet Take 80 mg by mouth daily.     No current facility-administered medications for this visit.   Allergies: Allergies  Allergen Reactions   Jardiance [Empagliflozin]    Linagliptin     tradjenta   Metformin And Related    I reviewed her past medical history, social history, family history, and environmental history and no significant changes have been reported from previous visits.  Review of Systems  Objective:  Physical exam not obtained as encounter was done via telephone.   Previous notes and tests were reviewed.  I discussed the assessment and  treatment plan with the patient. The patient was provided an opportunity to ask questions and all were answered. The patient agreed with the plan and demonstrated an understanding of the instructions.   The patient was advised to call back or seek an in-person evaluation if the symptoms worsen or if the condition fails to improve as anticipated.  I provided 15 minutes of non-face-to-face time during this encounter.  Alesia Morin, MD [facility]

## 2022-07-25 NOTE — Patient Instructions (Addendum)
1. Mild persistent asthma, possible mild exacerbation.  - We will temporarily increase Asmanex 2 puffs twice daily for 1 week as her symptoms are mild.  If no improvement, we will need to reassess in office and possibly consider a course of oral prednisone.  Did inform her that if her breathing status worsens or if she develops fevers/chills/body aches, she needs to be evaluated in urgent care.   - Daily controller medication(s): Asmanex Twisthaler 1 puffs once daily - Prior to physical activity: albuterol 2 puffs 10-15 minutes before physical activity. - Rescue medications: albuterol 4 puffs every 4-6 hours as needed - Changes during respiratory infections or worsening symptoms: Increase Asmanex to 2 puffs twice daily for TWO WEEKS. - Asthma control goals:  * Full participation in all desired activities (may need albuterol before activity) * Albuterol use two time or less a week on average (not counting use with activity) * Cough interfering with sleep two time or less a month * Oral steroids no more than once a year * No hospitalizations  2. LPRD (laryngopharyngeal reflux disease) - Continue with Prilosec daily. - Continue with famotidine daily.   3. Seasonal and perennial allergic rhinitis - Use nasal saline rinses before nose sprays such as with Neilmed Sinus Rinse.  Use distilled water.   - Use Flonase 2 sprays each nostril daily. Aim upward and outward.

## 2022-08-01 ENCOUNTER — Encounter: Payer: Self-pay | Admitting: Internal Medicine

## 2022-08-01 ENCOUNTER — Other Ambulatory Visit: Payer: Self-pay

## 2022-08-01 ENCOUNTER — Ambulatory Visit: Payer: Medicare Other | Admitting: Internal Medicine

## 2022-08-01 VITALS — BP 138/72 | HR 68 | Temp 98.1°F | Resp 16 | Ht 62.0 in | Wt 132.0 lb

## 2022-08-01 DIAGNOSIS — J3089 Other allergic rhinitis: Secondary | ICD-10-CM | POA: Diagnosis not present

## 2022-08-01 DIAGNOSIS — J302 Other seasonal allergic rhinitis: Secondary | ICD-10-CM

## 2022-08-01 DIAGNOSIS — K219 Gastro-esophageal reflux disease without esophagitis: Secondary | ICD-10-CM | POA: Diagnosis not present

## 2022-08-01 DIAGNOSIS — J4530 Mild persistent asthma, uncomplicated: Secondary | ICD-10-CM | POA: Diagnosis not present

## 2022-08-01 NOTE — Progress Notes (Signed)
   FOLLOW UP Date of Service/Encounter:  08/01/22   Subjective:  Rhonda Watkins (DOB: 06-11-1932) is a 88 y.o. female who [facility] for follow up for asthma, LPRD, allergic rhinitis.   History obtained from: chart review and patient. She was last seen as a telehealth with me 07/25/2022 for possible mild asthma exacerbation and informed to double her Asmanex dose.  She reported having to take more deeper breaths, runny nose, increased mucous production.    Since then, she reports still having few episodes here and there where she has to breath more deeply; occurs about 1-2x/day and lasts just a few seconds.  She denies any chest pain or palpitations or dizziness with it.  She is doing Asmanex 1 puff BID and also used her albuterol once but not sure if it felt any better.  Denies any coughing/wheezing.   In terms of her rhinitis, she reports doing well.  No more congestion/runny nose/increased mucous.  She is doing saline spray and Flonase daily.   Her reflux is doing well.  She takes Prilosec/Famotidine PRN rather than daily. Denies any heartburn/reflux symptoms.     Past Medical History: Past Medical History:  Diagnosis Date   Arthritis    Asthma    GERD (gastroesophageal reflux disease)    Hyperlipidemia    Hypertension    Mitral regurgitation    Shortness of breath dyspnea    occ   UTI (lower urinary tract infection)    Vitamin D deficiency     Objective:  BP 138/72 (BP Location: Right Arm, Patient Position: Sitting, Cuff Size: Small)   Pulse 68   Temp 98.1 F (36.7 C) (Temporal)   Resp 16   Ht 5\' 2"  (1.575 m)   Wt 132 lb (59.9 kg)   SpO2 98%   BMI 24.14 kg/m  Body mass index is 24.14 kg/m. Physical Exam: GEN: alert, well developed HEENT: clear conjunctiva, TM grey and translucent, nose with moderate inferior turbinate hypertrophy, pink nasal mucosa, no rhinorrhea, + cobblestoning HEART: regular rate and rhythm, no  murmur LUNGS: clear to auscultation bilaterally, no coughing, unlabored respiration SKIN: no rashes or lesions   Assessment/Plan   1. Mild persistent asthma, Controlled - Daily controller medication(s): Asmanex Twisthaler 1 puffs once daily - Prior to physical activity: albuterol 2 puffs 10-15 minutes before physical activity. - Rescue medications: albuterol 4 puffs every 4-6 hours as needed - Changes during respiratory infections or worsening symptoms: Increase Asmanex to 2 puffs twice daily for TWO WEEKS. - Asthma control goals:  * Full participation in all desired activities (may need albuterol before activity) * Albuterol use two time or less a week on average (not counting use with activity) * Cough interfering with sleep two time or less a month * Oral steroids no more than once a year * No hospitalizations  2. LPRD (laryngopharyngeal reflux disease), Controlled - Continue with Prilosec 20mg  daily as needed - Continue with famotidine 40mg  daily as needed.    3. Seasonal and perennial allergic rhinitis, Controlled - Use nasal saline rinses before nose sprays such as with Neilmed Sinus Rinse.  Use distilled water.   - Use Flonase 2 sprays each nostril daily. Aim upward and outward.  Follow up in 05/2023.  , MD  [facility]

## 2022-08-01 NOTE — Patient Instructions (Addendum)
Follow up: October 2024 with Dr. Dellis Anes or Dr. Lucie Leather or me.    1. Mild persistent asthma - Daily controller medication(s): Asmanex Twisthaler 1 puffs once daily - Prior to physical activity: albuterol 2 puffs 10-15 minutes before physical activity. - Rescue medications: albuterol 4 puffs every 4-6 hours as needed - Changes during respiratory infections or worsening symptoms: Increase Asmanex to 2 puffs twice daily for TWO WEEKS. - Asthma control goals:  * Full participation in all desired activities (may need albuterol before activity) * Albuterol use two time or less a week on average (not counting use with activity) * Cough interfering with sleep two time or less a month * Oral steroids no more than once a year * No hospitalizations  2. LPRD (laryngopharyngeal reflux disease) - Continue with Prilosec daily. - Continue with famotidine daily.   3. Seasonal and perennial allergic rhinitis - Use nasal saline rinses before nose sprays such as with Neilmed Sinus Rinse.  Use distilled water.   - Use Flonase 2 sprays each nostril daily. Aim upward and outward.

## 2022-11-03 ENCOUNTER — Encounter (HOSPITAL_COMMUNITY): Payer: Self-pay

## 2022-11-03 ENCOUNTER — Other Ambulatory Visit: Payer: Self-pay

## 2022-11-03 ENCOUNTER — Inpatient Hospital Stay (HOSPITAL_COMMUNITY): Payer: Medicare Other

## 2022-11-03 ENCOUNTER — Emergency Department (HOSPITAL_COMMUNITY): Payer: Medicare Other

## 2022-11-03 ENCOUNTER — Inpatient Hospital Stay (HOSPITAL_COMMUNITY)
Admission: EM | Admit: 2022-11-03 | Discharge: 2022-11-13 | DRG: 331 | Disposition: A | Discharge status: Skilled Nursing Facility | Payer: Medicare Other | Attending: Internal Medicine | Admitting: Internal Medicine

## 2022-11-03 DIAGNOSIS — D72829 Elevated white blood cell count, unspecified: Secondary | ICD-10-CM | POA: Diagnosis present

## 2022-11-03 DIAGNOSIS — Z9049 Acquired absence of other specified parts of digestive tract: Secondary | ICD-10-CM | POA: Diagnosis not present

## 2022-11-03 DIAGNOSIS — Z87891 Personal history of nicotine dependence: Secondary | ICD-10-CM

## 2022-11-03 DIAGNOSIS — H9201 Otalgia, right ear: Secondary | ICD-10-CM | POA: Diagnosis present

## 2022-11-03 DIAGNOSIS — K565 Intestinal adhesions [bands], unspecified as to partial versus complete obstruction: Secondary | ICD-10-CM | POA: Diagnosis present

## 2022-11-03 DIAGNOSIS — K0510 Chronic gingivitis, plaque induced: Secondary | ICD-10-CM | POA: Diagnosis present

## 2022-11-03 DIAGNOSIS — Z90711 Acquired absence of uterus with remaining cervical stump: Secondary | ICD-10-CM

## 2022-11-03 DIAGNOSIS — H9193 Unspecified hearing loss, bilateral: Secondary | ICD-10-CM | POA: Diagnosis present

## 2022-11-03 DIAGNOSIS — I10 Essential (primary) hypertension: Secondary | ICD-10-CM | POA: Diagnosis present

## 2022-11-03 DIAGNOSIS — M1990 Unspecified osteoarthritis, unspecified site: Secondary | ICD-10-CM | POA: Diagnosis present

## 2022-11-03 DIAGNOSIS — Z888 Allergy status to other drugs, medicaments and biological substances status: Secondary | ICD-10-CM

## 2022-11-03 DIAGNOSIS — R12 Heartburn: Secondary | ICD-10-CM | POA: Diagnosis not present

## 2022-11-03 DIAGNOSIS — K56609 Unspecified intestinal obstruction, unspecified as to partial versus complete obstruction: Principal | ICD-10-CM | POA: Diagnosis present

## 2022-11-03 DIAGNOSIS — K9171 Accidental puncture and laceration of a digestive system organ or structure during a digestive system procedure: Secondary | ICD-10-CM | POA: Diagnosis not present

## 2022-11-03 DIAGNOSIS — E1165 Type 2 diabetes mellitus with hyperglycemia: Secondary | ICD-10-CM | POA: Diagnosis present

## 2022-11-03 DIAGNOSIS — M810 Age-related osteoporosis without current pathological fracture: Secondary | ICD-10-CM | POA: Diagnosis present

## 2022-11-03 DIAGNOSIS — J45909 Unspecified asthma, uncomplicated: Secondary | ICD-10-CM | POA: Diagnosis present

## 2022-11-03 DIAGNOSIS — K219 Gastro-esophageal reflux disease without esophagitis: Secondary | ICD-10-CM | POA: Diagnosis present

## 2022-11-03 DIAGNOSIS — E1169 Type 2 diabetes mellitus with other specified complication: Secondary | ICD-10-CM | POA: Diagnosis present

## 2022-11-03 DIAGNOSIS — E7800 Pure hypercholesterolemia, unspecified: Secondary | ICD-10-CM | POA: Diagnosis present

## 2022-11-03 DIAGNOSIS — Y838 Other surgical procedures as the cause of abnormal reaction of the patient, or of later complication, without mention of misadventure at the time of the procedure: Secondary | ICD-10-CM | POA: Diagnosis not present

## 2022-11-03 DIAGNOSIS — Z794 Long term (current) use of insulin: Secondary | ICD-10-CM | POA: Diagnosis not present

## 2022-11-03 DIAGNOSIS — Z79899 Other long term (current) drug therapy: Secondary | ICD-10-CM | POA: Diagnosis not present

## 2022-11-03 DIAGNOSIS — Z8249 Family history of ischemic heart disease and other diseases of the circulatory system: Secondary | ICD-10-CM | POA: Diagnosis not present

## 2022-11-03 DIAGNOSIS — M79671 Pain in right foot: Secondary | ICD-10-CM | POA: Diagnosis not present

## 2022-11-03 HISTORY — DX: Type 2 diabetes mellitus without complications: E11.9

## 2022-11-03 LAB — LIPASE, BLOOD: Lipase: 25 U/L (ref 11–51)

## 2022-11-03 LAB — URINALYSIS, ROUTINE W REFLEX MICROSCOPIC
Bacteria, UA: NONE SEEN
Bilirubin Urine: NEGATIVE
Glucose, UA: NEGATIVE mg/dL
Ketones, ur: 20 mg/dL — AB
Nitrite: POSITIVE — AB
Protein, ur: NEGATIVE mg/dL
Specific Gravity, Urine: 1.021 (ref 1.005–1.030)
pH: 8 (ref 5.0–8.0)

## 2022-11-03 LAB — COMPREHENSIVE METABOLIC PANEL
ALT: 18 U/L (ref 0–44)
AST: 25 U/L (ref 15–41)
Albumin: 4.8 g/dL (ref 3.5–5.0)
Alkaline Phosphatase: 121 U/L (ref 38–126)
Anion gap: 13 (ref 5–15)
BUN: 22 mg/dL (ref 8–23)
CO2: 23 mmol/L (ref 22–32)
Calcium: 10.2 mg/dL (ref 8.9–10.3)
Chloride: 101 mmol/L (ref 98–111)
Creatinine, Ser: 0.65 mg/dL (ref 0.44–1.00)
GFR, Estimated: >60 mL/min (ref >60)
Glucose, Bld: 115 mg/dL — ABNORMAL HIGH (ref 70–99)
Potassium: 4.3 mmol/L (ref 3.5–5.1)
Sodium: 137 mmol/L (ref 135–145)
Total Bilirubin: 1.1 mg/dL (ref 0.3–1.2)
Total Protein: 8.3 g/dL — ABNORMAL HIGH (ref 6.5–8.1)

## 2022-11-03 LAB — CBC WITH DIFFERENTIAL/PLATELET
Abs Immature Granulocytes: 0.05 10*3/uL (ref 0.00–0.07)
Basophils Absolute: 0.1 10*3/uL (ref 0.0–0.1)
Basophils Relative: 0 %
Eosinophils Absolute: 0.1 10*3/uL (ref 0.0–0.5)
Eosinophils Relative: 1 %
HCT: 47.9 % — ABNORMAL HIGH (ref 36.0–46.0)
Hemoglobin: 15.5 g/dL — ABNORMAL HIGH (ref 12.0–15.0)
Immature Granulocytes: 0 %
Lymphocytes Relative: 22 %
Lymphs Abs: 3 10*3/uL (ref 0.7–4.0)
MCH: 30.3 pg (ref 26.0–34.0)
MCHC: 32.4 g/dL (ref 30.0–36.0)
MCV: 93.7 fL (ref 80.0–100.0)
Monocytes Absolute: 0.9 10*3/uL (ref 0.1–1.0)
Monocytes Relative: 6 %
Neutro Abs: 10 10*3/uL — ABNORMAL HIGH (ref 1.7–7.7)
Neutrophils Relative %: 71 %
Platelets: 281 10*3/uL (ref 150–400)
RBC: 5.11 MIL/uL (ref 3.87–5.11)
RDW: 13.6 % (ref 11.5–15.5)
WBC: 14.1 10*3/uL — ABNORMAL HIGH (ref 4.0–10.5)
nRBC: 0 % (ref 0.0–0.2)

## 2022-11-03 LAB — LACTIC ACID, PLASMA
Lactic Acid, Venous: 1.6 mmol/L (ref 0.5–1.9)
Lactic Acid, Venous: 1.6 mmol/L (ref 0.5–1.9)

## 2022-11-03 MED ORDER — IOHEXOL 300 MG/ML  SOLN
100.0000 mL | Freq: Once | INTRAMUSCULAR | Status: AC | PRN
  Administered 2022-11-03: 100 mL via INTRAVENOUS

## 2022-11-03 MED ORDER — FENTANYL CITRATE PF 50 MCG/ML IJ SOSY
50.0000 ug | PREFILLED_SYRINGE | INTRAMUSCULAR | Status: DC | PRN
  Administered 2022-11-03 (×2): 50 ug via INTRAVENOUS
  Filled 2022-11-03 (×2): qty 1

## 2022-11-03 MED ORDER — DILTIAZEM HCL ER COATED BEADS 240 MG PO CP24
240.0000 mg | ORAL_CAPSULE | Freq: Every day | ORAL | Status: DC
  Filled 2022-11-03: qty 1

## 2022-11-03 MED ORDER — DICYCLOMINE HCL 10 MG/ML IM SOLN
20.0000 mg | Freq: Once | INTRAMUSCULAR | Status: AC
  Administered 2022-11-03: 20 mg via INTRAMUSCULAR
  Filled 2022-11-03: qty 2

## 2022-11-03 MED ORDER — FUROSEMIDE 40 MG PO TABS
40.0000 mg | ORAL_TABLET | Freq: Every day | ORAL | Status: DC
  Filled 2022-11-03: qty 1

## 2022-11-03 MED ORDER — OXYCODONE-ACETAMINOPHEN 5-325 MG PO TABS: 1.0000 | ORAL_TABLET | Freq: Once | ORAL | Status: DC

## 2022-11-03 MED ORDER — ONDANSETRON HCL 4 MG/2ML IJ SOLN
4.0000 mg | Freq: Once | INTRAMUSCULAR | Status: AC
  Administered 2022-11-03: 4 mg via INTRAVENOUS
  Filled 2022-11-03: qty 2

## 2022-11-03 MED ORDER — HYDRALAZINE HCL 20 MG/ML IJ SOLN
10.0000 mg | Freq: Four times a day (QID) | INTRAMUSCULAR | Status: DC | PRN
  Administered 2022-11-04 – 2022-11-05 (×2): 10 mg via INTRAVENOUS
  Filled 2022-11-03 (×3): qty 1

## 2022-11-03 MED ORDER — FENTANYL CITRATE PF 50 MCG/ML IJ SOSY
50.0000 ug | PREFILLED_SYRINGE | Freq: Once | INTRAMUSCULAR | Status: AC
  Administered 2022-11-03: 50 ug via INTRAVENOUS
  Filled 2022-11-03: qty 1

## 2022-11-03 MED ORDER — DILTIAZEM HCL ER 180 MG PO CP24: 180.0000 mg | ORAL_CAPSULE | Freq: Every day | ORAL | Status: DC

## 2022-11-03 MED ORDER — MORPHINE SULFATE (PF) 2 MG/ML IV SOLN
1.0000 mg | INTRAVENOUS | Status: DC | PRN
  Administered 2022-11-03 – 2022-11-04 (×5): 1 mg via INTRAVENOUS
  Filled 2022-11-03 (×5): qty 1

## 2022-11-03 MED ORDER — IRBESARTAN 150 MG PO TABS
300.0000 mg | ORAL_TABLET | Freq: Every day | ORAL | Status: DC
  Filled 2022-11-03: qty 2

## 2022-11-03 MED ORDER — ONDANSETRON HCL 4 MG/2ML IJ SOLN
4.0000 mg | Freq: Four times a day (QID) | INTRAMUSCULAR | Status: DC | PRN
  Administered 2022-11-03 – 2022-11-09 (×4): 4 mg via INTRAVENOUS
  Filled 2022-11-03 (×4): qty 2

## 2022-11-03 MED ORDER — LACTATED RINGERS IV BOLUS
1000.0000 mL | Freq: Once | INTRAVENOUS | Status: AC
  Administered 2022-11-03: 1000 mL via INTRAVENOUS

## 2022-11-03 NOTE — ED Notes (Addendum)
Called to floor to talk to nurse that will accept this patient receptionist stated she would transfere me to her phone with no answer, will try again.

## 2022-11-03 NOTE — Assessment & Plan Note (Signed)
Glucose normal  - Hold home Basaglar while n.p.o. - SS corrections q4 and adjust as needed

## 2022-11-03 NOTE — ED Triage Notes (Signed)
C/o generalized abd cramping with nausea. Pain worse with palpitation.  Last normal BM 2 days ago  Mirlax last night with small BM this am but pain worsened.  Denies urinary s/sy

## 2022-11-03 NOTE — ED Notes (Signed)
Called to floor to speak to nurse Currie Paris RN no answer X 2 will send secure chat

## 2022-11-03 NOTE — ED Provider Triage Note (Signed)
Emergency Medicine Provider Triage Evaluation Note  Rhonda Watkins , a 89 y.o. female  was evaluated in triage.  Pt complains of abdominal cramping.. States that she took miralax last night to help with having a bowel movement. States that she forced having a small bowel movement this morning, but is having worsening abdominal pain. Felt mildly nauseated yesterday. States last BM was 2 days ago. No dysuria.   Review of Systems  Positive: See above Negative:   Physical Exam  There were no vitals taken for this visit. Gen:   Awake, uncomfortable on exam   Resp:  Normal effort  MSK:   Moves extremities without difficulty  Other:  Diffuse abdominal pain even to light palpation.   Medical Decision Making  Medically screening exam initiated at 2:21 PM.  Appropriate orders placed.  Davine J Brennan was informed that the remainder of the evaluation will be completed by another provider, this initial triage assessment does not replace that evaluation, and the importance of remaining in the ED until their evaluation is complete.     Mickie Hillier, PA-C 11/03/22 1424

## 2022-11-03 NOTE — Consult Note (Signed)
Reason for Consult:abd pain Referring Physician: Dr. Mellody Life is an 89 y.o. female.  HPI: The patient is an 89 year old white female who has been having problems off and on with constipation.  She started having some abdominal pain in the last day or 2.  She took some MiraLAX and unfortunately did not have a bowel movement.  She then had more abdominal pain so she came to the emergency department.  A CT scan was suggestive of a small bowel obstruction.  She has apparently had a lot of abdominal surgery in the past.  She had 1 episode of vomiting after arriving to the hospital.  She is not sure when her last bowel movement or flatus was.  Past Medical History:  Diagnosis Date   Arthritis    Asthma    Diabetes mellitus without complication (HCC)    GERD (gastroesophageal reflux disease)    Hyperlipidemia    Hypertension    Mitral regurgitation    Shortness of breath dyspnea    occ   UTI (lower urinary tract infection)    Vitamin D deficiency     Past Surgical History:  Procedure Laterality Date   BACK SURGERY  85,04   BLADDER REPAIR  95,05   BUNIONECTOMY Left 15   cholecyctectomy     CHOLECYSTECTOMY  80   COLON SURGERY  05   EXPLORATION MIDDLE EAR Left    vertigo- no hearing now- 89 yrs old   HERNIA REPAIR  [phone]   KNEE ARTHROSCOPY     lft 02 rt 09    Family History  Problem Relation Age of Onset   Cancer Mother    Heart attack Father    Coronary artery disease Father     Social History:  reports that she quit smoking about 64 years ago. She has never used smokeless tobacco. She reports current alcohol use. She reports that she does not use drugs.  Allergies:  Allergies  Allergen Reactions   Atorvastatin Other (See Comments)    Reaction not known by family   Ezetimibe Other (See Comments)    Reaction not known by family   Jardiance [Empagliflozin]    Linagliptin Other (See Comments)    Tradjenta   Metformin And Related    Pravastatin Sodium Other  (See Comments)   Rosuvastatin Other (See Comments)   Simvastatin Other (See Comments)    Medications: I have reviewed the patient's current medications.  Results for orders placed or performed during the hospital encounter of 11/03/22 (from the past 48 hour(s))  Comprehensive metabolic panel     Status: Abnormal   Collection Time: 11/03/22  2:23 PM  Result Value Ref Range   Sodium 137 135 - 145 mmol/L   Potassium 4.3 3.5 - 5.1 mmol/L   Chloride 101 98 - 111 mmol/L   CO2 23 22 - 32 mmol/L   Glucose, Bld 115 (H) 70 - 99 mg/dL    Comment: Glucose reference range applies only to samples taken after fasting for at least 8 hours.   BUN 22 8 - 23 mg/dL   Creatinine, Ser 0.65 0.44 - 1.00 mg/dL   Calcium 10.2 8.9 - 10.3 mg/dL   Total Protein 8.3 (H) 6.5 - 8.1 g/dL   Albumin 4.8 3.5 - 5.0 g/dL   AST 25 15 - 41 U/L   ALT 18 0 - 44 U/L   Alkaline Phosphatase 121 38 - 126 U/L   Total Bilirubin 1.1 0.3 - 1.2 mg/dL  GFR, Estimated >60 >60 mL/min    Comment: (NOTE) Calculated using the CKD-EPI Creatinine Equation (2021)    Anion gap 13 5 - 15    Comment: Performed at [facility], [place] [address], [place], [place] [address]  Lipase, blood     Status: None   Collection Time: 11/03/22  2:23 PM  Result Value Ref Range   Lipase 25 11 - 51 U/L    Comment: Performed at [facility], [place] [address] [place], [place] [address]  CBC with Differential     Status: Abnormal   Collection Time: 11/03/22  2:23 PM  Result Value Ref Range   WBC 14.1 (H) 4.0 - 10.5 K/uL   RBC 5.11 3.87 - 5.11 MIL/uL   Hemoglobin 15.5 (H) 12.0 - 15.0 g/dL   HCT 47.9 (H) 36.0 - 46.0 %   MCV 93.7 80.0 - 100.0 fL   MCH 30.3 26.0 - 34.0 pg   MCHC 32.4 30.0 - 36.0 g/dL   RDW 13.6 11.5 - 15.5 %   Platelets 281 150 - 400 K/uL   nRBC 0.0 0.0 - 0.2 %   Neutrophils Relative % 71 %   Neutro Abs 10.0 (H) 1.7 - 7.7 K/uL   Lymphocytes Relative 22 %   Lymphs Abs 3.0 0.7 - 4.0 K/uL    Monocytes Relative 6 %   Monocytes Absolute 0.9 0.1 - 1.0 K/uL   Eosinophils Relative 1 %   Eosinophils Absolute 0.1 0.0 - 0.5 K/uL   Basophils Relative 0 %   Basophils Absolute 0.1 0.0 - 0.1 K/uL   Immature Granulocytes 0 %   Abs Immature Granulocytes 0.05 0.00 - 0.07 K/uL    Comment: Performed at [facility], [place] [address], [place], [place] [address]  Urinalysis, Routine w reflex microscopic -Urine, Clean Catch     Status: Abnormal   Collection Time: 11/03/22  2:23 PM  Result Value Ref Range   Color, Urine YELLOW YELLOW   APPearance CLEAR CLEAR   Specific Gravity, Urine 1.021 1.005 - 1.030   pH 8.0 5.0 - 8.0   Glucose, UA NEGATIVE NEGATIVE mg/dL   Hgb urine dipstick SMALL (A) NEGATIVE   Bilirubin Urine NEGATIVE NEGATIVE   Ketones, ur 20 (A) NEGATIVE mg/dL   Protein, ur NEGATIVE NEGATIVE mg/dL   Nitrite POSITIVE (A) NEGATIVE   Leukocytes,Ua SMALL (A) NEGATIVE   RBC / HPF 0-5 0 - 5 RBC/hpf   WBC, UA 6-10 0 - 5 WBC/hpf   Bacteria, UA NONE SEEN NONE SEEN   Squamous Epithelial / HPF 0-5 0 - 5 /HPF    Comment: Performed at [facility], [place] [address] [place], [place] [address]  Lactic acid, plasma     Status: None   Collection Time: 11/03/22  5:50 PM  Result Value Ref Range   Lactic Acid, Venous 1.6 0.5 - 1.9 mmol/L    Comment: Performed at [facility], [place] [address], [place], [place] [address]    DG Abd Portable 1 View  Result Date: 11/03/2022 CLINICAL DATA:  NG placement EXAM: PORTABLE ABDOMEN - 1 VIEW COMPARISON:  CT abdomen pelvis 11/03/2022 FINDINGS: Enteric tube courses below the hemidiaphragm with tip and side port overlying the gastric lumen the bowel gas pattern is normal. Right upper quadrant clips with inferior to this an 8 mm calcification that is noted to be related to the proximal pancreas/pancreatic duct on CT. Aortic calcification. Lower abdomen and pelvis collimated off view.  IMPRESSION: 1. Enteric tube  in good position. 2.  Aortic Atherosclerosis (ICD10-I70.0). Electronically Signed   By: Iven Finn M.D.   On: 11/03/2022 19:29   CT Abdomen Pelvis W Contrast  Result Date: 11/03/2022 CLINICAL DATA:  Crampy abdominal pain with nausea. EXAM: CT ABDOMEN AND PELVIS WITH CONTRAST TECHNIQUE: Multidetector CT imaging of the abdomen and pelvis was performed using the standard protocol following bolus administration of intravenous contrast. RADIATION DOSE REDUCTION: This exam was performed according to the departmental dose-optimization program which includes automated exposure control, adjustment of the mA and/or kV according to patient size and/or use of iterative reconstruction technique. CONTRAST:  130mL OMNIPAQUE IOHEXOL 300 MG/ML  SOLN COMPARISON:  CT 05/15/2020.  Older exams as well. FINDINGS: Lower chest: Stable 3 mm left lower lobe lung nodule demonstrating long-term stability. No specific imaging follow-up. There is some scarring atelectatic changes along the lung bases. Coronary artery calcifications are seen. Small hiatal hernia. Slight wall thickening as well along the distal esophagus. Please correlate with any particular symptoms. Hepatobiliary: Previous cholecystectomy. Patent portal vein. There are some tiny low-attenuation lesions identified in the liver such as left hepatic lobe lateral segment on series 2 image 13 measuring 5 mm, unchanged from previous. Likely benign cysts. No specific imaging follow-up. Pancreas: Severe atrophy of the pancreas with ductal dilatation and calcification or stone within the head of the pancreas on image 21. This could be along the duct. This was seen previously. Spleen: Normal in size without focal abnormality.  Small splenule. Adrenals/Urinary Tract: The adrenal glands are preserved. Slightly malrotated right kidney. There are some tiny low-attenuation foci noted each kidney, under a cm and too small to completely characterize. Bosniak 2 lesions. There are 2  adjacent upper pole cysts as well on the left which are simple. Largest on the prior measured 5.4 cm and today on series 7, image 9 measures 5 cm. Bosniak 1 lesions. Prominent bilateral renal sinus fat. The ureters have normal course and caliber. Preserved contours of the urinary bladder. Slight dependent air in the bladder. Please correlate for any prior instrumentation. Stomach/Bowel: Large bowel has a normal course and caliber. Mild colonic stool. There are several mildly dilated loops of small bowel with luminal air and fluid. This involves loops of jejunum and ileum. There are areas of small bowel stool appearance along the ileum as well. There appears to be a caliber change in the course of the bowel as seen on coronal image 37 of series 4, axial image 54 of series 2 and sagittal series 5, image 59. Developing or partial obstruction is possible. No pneumatosis. No portal venous gas. Stomach is nondilated. Vascular/Lymphatic: Scattered vascular calcifications are identified, moderate distribution and extent. There are areas of stenosis identified such as along the renal arteries, right common femoral artery. Please correlate with any particular symptoms including claudication or hypertension. No specific abnormal lymph node enlargement identified in the abdomen and pelvis. Reproductive: Status post hysterectomy. No adnexal masses. Other: Anasarca. There is thickening along the anterior abdominal wall. Mild nonspecific mesenteric haziness. No ascites. Musculoskeletal: Streak artifact related to the patient's spinal fixation hardware along the lumbar spine with laminectomy. Moderate multilevel degenerative changes. There is significant disc bulging at levels of the lumbar spine including L5-S1 and L1-2 with stenosis. IMPRESSION: Mildly dilated loops of small bowel with small bowel stool appearance. Transition point in the anterior central pelvis. Partial or developing obstruction is possible. No free air, fluid  or pneumatosis. Colonic diverticula. Surgical changes from cholecystectomy and  along the lumbar spine. Small hiatal hernia with wall thickening along the lower esophagus. Please correlate with any particular symptoms. Severe atrophy of the pancreas with ductal dilatation and progressive calcifications along the pancreatic head. Possibly along the course of the main pancreatic duct. Electronically Signed   By: Jill Side M.D.   On: 11/03/2022 16:43    Review of Systems  Constitutional: Negative.   HENT:  Positive for hearing loss.   Eyes: Negative.   Respiratory: Negative.    Cardiovascular: Negative.   Gastrointestinal:  Positive for abdominal pain, constipation and vomiting.  Endocrine: Negative.   Genitourinary: Negative.   Musculoskeletal: Negative.   Skin: Negative.   Allergic/Immunologic: Negative.   Neurological: Negative.   Hematological: Negative.   Psychiatric/Behavioral: Negative.     Blood pressure (!) 177/79, pulse 79, temperature 98.1 F (36.7 C), temperature source Oral, resp. rate 16, weight 59 kg, SpO2 100 %. Physical Exam Vitals reviewed.  Constitutional:      General: She is not in acute distress.    Appearance: Normal appearance.  HENT:     Head: Normocephalic and atraumatic.     Right Ear: External ear normal.     Nose: Nose normal.     Mouth/Throat:     Mouth: Mucous membranes are dry.     Pharynx: Oropharynx is clear.  Eyes:     Extraocular Movements: Extraocular movements intact.     Conjunctiva/sclera: Conjunctivae normal.     Pupils: Pupils are equal, round, and reactive to light.  Cardiovascular:     Rate and Rhythm: Normal rate and regular rhythm.     Pulses: Normal pulses.     Heart sounds: Normal heart sounds.  Pulmonary:     Effort: Pulmonary effort is normal. No respiratory distress.     Breath sounds: Normal breath sounds.  Abdominal:     General: There is distension.     Tenderness: There is abdominal tenderness.     Comments: There is  moderate lower abd tenderness. The abd is soft  Musculoskeletal:        General: No swelling or deformity. Normal range of motion.     Cervical back: Normal range of motion. No tenderness.  Skin:    General: Skin is warm and dry.     Coloration: Skin is not jaundiced.  Neurological:     General: No focal deficit present.     Mental Status: She is alert and oriented to person, place, and time.  Psychiatric:        Mood and Affect: Mood normal.        Behavior: Behavior normal.     Assessment/Plan: The patient appears to have a small bowel obstruction most likely related to scar tissue from previous surgeries.  At this point I would agree with decompression with an NG tube and bowel rest.  She will be hydrated overnight.  We will start the small bowel protocol in the morning and monitor her closely.  Autumn Messing III 11/03/2022, 9:53 PM

## 2022-11-03 NOTE — Assessment & Plan Note (Addendum)
Blood pressure slightly elevated - Hold furosemide, irbesartan, diltiazem, resume when able to take p.o. - Continue as needed hydralazine IV for severe range pressures

## 2022-11-03 NOTE — ED Provider Notes (Signed)
[place] EMERGENCY DEPARTMENT AT [facility] Provider Note   CSN: [id number] Arrival date & time: 11/03/22  1409     History  Chief Complaint  Patient presents with   Abdominal Pain    Rhonda Watkins is a 89 y.o. female.   Abdominal Pain Associated symptoms: nausea and vomiting      89 year old female with medical history significant for HTN, HLD, asthma, GERD, DM2 who presents to the emergency department with abdominal pain.  The patient states that she was more active over the last few days helping with the move.  She states that yesterday she felt constipated and took a dose of MiraLAX.  She states that this morning she stopped passing gas and developed severe abdominal pain with associated cramping.  Her last normal bowel movement was 2 days ago.  She tried to have a bowel movement this morning and was straining and only got a little bit out.  She denies any dysuria or increased urinary frequency.  While in the waiting room, she did have an episode of emesis, could not remember if it was nonbloody or nonbilious.  She endorses mild nausea.  No fevers or chills.  She still states that she is not passing gas.  She has a history of multiple intra-abdominal surgeries to include bladder repair, cholecystectomy, hernia repair.  Home Medications Prior to Admission medications   Medication Sig Start Date End Date Taking? Authorizing Provider  albuterol (VENTOLIN HFA) 108 (90 Base) MCG/ACT inhaler Inhale 1-2 puffs into the lungs every 6 (six) hours as needed for wheezing or shortness of breath. 07/25/22   Larose Kells, MD  atorvastatin (LIPITOR) 10 MG tablet  12/27/17   [provider]  Cholecalciferol (VITAMIN D PO) Take 3,000 Units by mouth daily.     [provider]  diltiazem (DILACOR XR) 180 MG 24 hr capsule Take 180 mg by mouth daily.    [provider]  famotidine (PEPCID) 40 MG tablet TAKE ONE TABLET IN THE EVENING 04/28/22   Althea Charon,  FNP  fluticasone [facility]) 50 MCG/ACT nasal spray Place 2 sprays into both nostrils daily. 07/25/22   Larose Kells, MD  furosemide (LASIX) 40 MG tablet Take 40 mg by mouth every other day. 08/31/16   [provider]  glucose blood (ONETOUCH VERIO) test strip CHECK BLOOD SUGAR TWICE DAILY AS DIRECTED.    [provider]  guaiFENesin (MUCINEX) 600 MG 12 hr tablet Take by mouth 2 (two) times daily as needed.    [provider]  Insulin Glargine (BASAGLAR KWIKPEN) 100 UNIT/ML Inject 12 Units into the skin daily.    [provider]  Lancets MISC twice a day as directed 03/16/20   [provider]  mometasone (ASMANEX, 30 METERED DOSES,) 220 MCG/ACT inhaler INHALE 1 PUFF INTO LUNGS DAILY. Rinse mouth out afterwards 07/25/22   Larose Kells, MD  omeprazole (PRILOSEC) 20 MG capsule TAKE ONE CAPSULE IN THE MORNING 07/17/22   Kozlow, Donnamarie Poag, MD  polyethylene glycol powder (MIRALAX) 17 GM/SCOOP powder Use 1-2 caps in 8 oz liquid up to 3 times a day as needed until you are having regular bowel movements. 05/15/20   Caccavale, Sophia, PA-C  Potassium Chloride CR (MICRO-K) 8 MEQ CPCR capsule CR Take 8 mEq by mouth daily.     [provider]  SURE COMFORT PEN NEEDLES 31G X 8 MM [place] 11/21/20   [provider]  telmisartan (MICARDIS) 80 MG tablet Take 80  mg by mouth daily.    [provider]      Allergies    Atorvastatin, Ezetimibe, Jardiance [empagliflozin], Linagliptin, Metformin and related, Pravastatin sodium, Rosuvastatin, and Simvastatin    Review of Systems   Review of Systems  Gastrointestinal:  Positive for abdominal pain, nausea and vomiting.  All other systems reviewed and are negative.   Physical Exam Updated Vital Signs BP (!) 173/100   Pulse 85   Temp (!) 97 F (36.1 C)   Resp 16   Wt 59 kg   SpO2 98%   BMI 23.79 kg/m  Physical Exam Vitals and nursing note reviewed.  Constitutional:       General: She is not in acute distress.    Appearance: She is well-developed.     Comments: GCS 15, moaning in pain  HENT:     Head: Normocephalic and atraumatic.  Eyes:     Conjunctiva/sclera: Conjunctivae normal.  Cardiovascular:     Rate and Rhythm: Normal rate and regular rhythm.     Heart sounds: No murmur heard. Pulmonary:     Effort: Pulmonary effort is normal. No respiratory distress.     Breath sounds: Normal breath sounds.  Abdominal:     Palpations: Abdomen is soft.     Tenderness: There is generalized abdominal tenderness. There is guarding.  Musculoskeletal:        General: No swelling.     Cervical back: Neck supple.  Skin:    General: Skin is warm and dry.     Capillary Refill: Capillary refill takes less than 2 seconds.  Neurological:     Mental Status: She is alert.  Psychiatric:        Mood and Affect: Mood normal.     ED Results / Procedures / Treatments   Labs (all labs ordered are listed, but only abnormal results are displayed) Labs Reviewed  COMPREHENSIVE METABOLIC PANEL - Abnormal; Notable for the following components:      Result Value   Glucose, Bld 115 (*)    Total Protein 8.3 (*)    All other components within normal limits  CBC WITH DIFFERENTIAL/PLATELET - Abnormal; Notable for the following components:   WBC 14.1 (*)    Hemoglobin 15.5 (*)    HCT 47.9 (*)    Neutro Abs 10.0 (*)    All other components within normal limits  LIPASE, BLOOD  LACTIC ACID, PLASMA  URINALYSIS, ROUTINE W REFLEX MICROSCOPIC  LACTIC ACID, PLASMA    EKG None  Radiology DG Abd Portable 1 View  Result Date: 11/03/2022 CLINICAL DATA:  NG placement EXAM: PORTABLE ABDOMEN - 1 VIEW COMPARISON:  CT abdomen pelvis 11/03/2022 FINDINGS: Enteric tube courses below the hemidiaphragm with tip and side port overlying the gastric lumen the bowel gas pattern is normal. Right upper quadrant clips with inferior to this an 8 mm calcification that is noted to be related to the  proximal pancreas/pancreatic duct on CT. Aortic calcification. Lower abdomen and pelvis collimated off view. IMPRESSION: 1. Enteric tube in good position. 2.  Aortic Atherosclerosis (ICD10-I70.0). Electronically Signed   By: Iven Finn M.D.   On: 11/03/2022 19:29   CT Abdomen Pelvis W Contrast  Result Date: 11/03/2022 CLINICAL DATA:  Crampy abdominal pain with nausea. EXAM: CT ABDOMEN AND PELVIS WITH CONTRAST TECHNIQUE: Multidetector CT imaging of the abdomen and pelvis was performed using the standard protocol following bolus administration of intravenous contrast. RADIATION DOSE REDUCTION: This exam was performed according to the departmental dose-optimization program  which includes automated exposure control, adjustment of the mA and/or kV according to patient size and/or use of iterative reconstruction technique. CONTRAST:  182mL OMNIPAQUE IOHEXOL 300 MG/ML  SOLN COMPARISON:  CT 05/15/2020.  Older exams as well. FINDINGS: Lower chest: Stable 3 mm left lower lobe lung nodule demonstrating long-term stability. No specific imaging follow-up. There is some scarring atelectatic changes along the lung bases. Coronary artery calcifications are seen. Small hiatal hernia. Slight wall thickening as well along the distal esophagus. Please correlate with any particular symptoms. Hepatobiliary: Previous cholecystectomy. Patent portal vein. There are some tiny low-attenuation lesions identified in the liver such as left hepatic lobe lateral segment on series 2 image 13 measuring 5 mm, unchanged from previous. Likely benign cysts. No specific imaging follow-up. Pancreas: Severe atrophy of the pancreas with ductal dilatation and calcification or stone within the head of the pancreas on image 21. This could be along the duct. This was seen previously. Spleen: Normal in size without focal abnormality.  Small splenule. Adrenals/Urinary Tract: The adrenal glands are preserved. Slightly malrotated right kidney. There are  some tiny low-attenuation foci noted each kidney, under a cm and too small to completely characterize. Bosniak 2 lesions. There are 2 adjacent upper pole cysts as well on the left which are simple. Largest on the prior measured 5.4 cm and today on series 7, image 9 measures 5 cm. Bosniak 1 lesions. Prominent bilateral renal sinus fat. The ureters have normal course and caliber. Preserved contours of the urinary bladder. Slight dependent air in the bladder. Please correlate for any prior instrumentation. Stomach/Bowel: Large bowel has a normal course and caliber. Mild colonic stool. There are several mildly dilated loops of small bowel with luminal air and fluid. This involves loops of jejunum and ileum. There are areas of small bowel stool appearance along the ileum as well. There appears to be a caliber change in the course of the bowel as seen on coronal image 37 of series 4, axial image 54 of series 2 and sagittal series 5, image 59. Developing or partial obstruction is possible. No pneumatosis. No portal venous gas. Stomach is nondilated. Vascular/Lymphatic: Scattered vascular calcifications are identified, moderate distribution and extent. There are areas of stenosis identified such as along the renal arteries, right common femoral artery. Please correlate with any particular symptoms including claudication or hypertension. No specific abnormal lymph node enlargement identified in the abdomen and pelvis. Reproductive: Status post hysterectomy. No adnexal masses. Other: Anasarca. There is thickening along the anterior abdominal wall. Mild nonspecific mesenteric haziness. No ascites. Musculoskeletal: Streak artifact related to the patient's spinal fixation hardware along the lumbar spine with laminectomy. Moderate multilevel degenerative changes. There is significant disc bulging at levels of the lumbar spine including L5-S1 and L1-2 with stenosis. IMPRESSION: Mildly dilated loops of small bowel with small bowel  stool appearance. Transition point in the anterior central pelvis. Partial or developing obstruction is possible. No free air, fluid or pneumatosis. Colonic diverticula. Surgical changes from cholecystectomy and along the lumbar spine. Small hiatal hernia with wall thickening along the lower esophagus. Please correlate with any particular symptoms. Severe atrophy of the pancreas with ductal dilatation and progressive calcifications along the pancreatic head. Possibly along the course of the main pancreatic duct. Electronically Signed   By: Jill Side M.D.   On: 11/03/2022 16:43    Procedures Procedures    Medications Ordered in ED Medications  oxyCODONE-acetaminophen (PERCOCET/ROXICET) 5-325 MG per tablet 1 tablet (1 tablet Oral Not Given 11/03/22 1726)  fentaNYL (SUBLIMAZE) injection 50 mcg (50 mcg Intravenous Given 11/03/22 1848)  dicyclomine (BENTYL) injection 20 mg (20 mg Intramuscular Given 11/03/22 1448)  iohexol (OMNIPAQUE) 300 MG/ML solution 100 mL (100 mLs Intravenous Contrast Given 11/03/22 1621)  lactated ringers bolus 1,000 mL (1,000 mLs Intravenous New Bag/Given 11/03/22 1746)  fentaNYL (SUBLIMAZE) injection 50 mcg (50 mcg Intravenous Given 11/03/22 1745)  ondansetron (ZOFRAN) injection 4 mg (4 mg Intravenous Given 11/03/22 1745)    ED Course/ Medical Decision Making/ A&P Clinical Course as of 11/03/22 1933  Mon Nov 03, 2022  1724 WBC(!): 14.1 [JL]    Clinical Course User Index [JL] Regan Lemming, MD                             Medical Decision Making Amount and/or Complexity of Data Reviewed Labs: ordered. Decision-making details documented in ED Course. Radiology: ordered.  Risk Prescription drug management. Decision regarding hospitalization.    89 year old female with medical history significant for HTN, HLD, asthma, GERD, DM2 who presents to the emergency department with abdominal pain.  The patient states that she was more active over the last few days helping with  the move.  She states that yesterday she felt constipated and took a dose of MiraLAX.  She states that this morning she stopped passing gas and developed severe abdominal pain with associated cramping.  Her last normal bowel movement was 2 days ago.  She tried to have a bowel movement this morning and was straining and only got a little bit out.  She denies any dysuria or increased urinary frequency.  While in the waiting room, she did have an episode of emesis, could not remember if it was nonbloody or nonbilious.  She endorses mild nausea.  No fevers or chills.  She still states that she is not passing gas.  She has a history of multiple intra-abdominal surgeries to include bladder repair, cholecystectomy, hernia repair.  On arrival, the patient was afebrile, not tachycardic or tachypneic, sinus rhythm noted on cardiac telemetry.  Hypertension noted 177/78, saturating 99% on room air.  Physical exam significant for generalized abdominal tenderness to palpation with guarding present.  Differential diagnosis includes small bowel obstruction considered most likely, considered mesenteric ischemia, gastritis, GERD, pancreatitis, intra-abdominal mass, diverticulitis, nephrolithiasis/pyelonephritis.  Laboratory evaluation significant for CBC with leukocytosis to 14.1, evidence of hemoconcentration with a hemoglobin of 15.5 and hematocrit of 47.9, CMP generally unremarkable, lipase normal, lactic acid ordered and pending, UA ordered and pending.  Patient denies any genitourinary symptoms.  IV access was obtained and the patient was administered IV fluid bolus, IV fentanyl, IV Zofran.  CT of the abdomen pelvis with contrast was performed: IMPRESSION:  Mildly dilated loops of small bowel with small bowel stool  appearance. Transition point in the anterior central pelvis. Partial  or developing obstruction is possible. No free air, fluid or  pneumatosis.    Colonic diverticula.    Surgical changes from  cholecystectomy and along the lumbar spine.    Small hiatal hernia with wall thickening along the lower esophagus.  Please correlate with any particular symptoms.    Severe atrophy of the pancreas with ductal dilatation and  progressive calcifications along the pancreatic head. Possibly along  the course of the main pancreatic duct.    General surgery consulted for consultation inpatient, an NG tube was ordered, pain control was ordered as needed, hospitalist medicine was consulted for admission for likely medical management.  Final Clinical Impression(s) / ED Diagnoses Final diagnoses:  SBO (small bowel obstruction) ([place])    Rx / DC Orders ED Discharge Orders     None         Regan Lemming, MD 11/03/22 1933

## 2022-11-03 NOTE — Assessment & Plan Note (Addendum)
-  Pt cannot recall if she is on a statin

## 2022-11-03 NOTE — Assessment & Plan Note (Addendum)
-  CT A/P with mildly dilated loops of small bowel with small bowel stool appearance. Transition point in the anterior central pelvis. Partial or developing obstruction is possible.  -Last Bowel movement this morning was minimal  -Had one episode of vomiting in Triage. NG tube now placed to low intermittent suction.  -General surgery was consulted by EDP and EDP was awaiting call back at time of admission.  -keep NPO

## 2022-11-03 NOTE — H&P (Signed)
History and Physical    Patient: Rhonda Watkins DOB: 1931/10/08 DOA: 11/03/2022 DOS: the patient was seen and examined on 11/03/2022 PCP: Kathalene Frames, MD  Patient coming from: Home  Chief Complaint:  Chief Complaint  Patient presents with   Abdominal Pain   HPI: Rhonda Watkins is a 89 y.o. female with medical history significant of HTN, Asthma, GERD, T2DM, HLD, osteoporosis who presents with abdominal pain and nausea.   Has severe constipation yesterday. Then today began to feel nausea and vomited once. Had upper abdominal pain starting today. This morning was able to force minimal stool out.  Has hx of cholecystectomy, partial hysterectomy and some type of colon surgery that she cannot recall.  She is under a lot of stress with her husband passing away in October and was planning to move and close on a house later this week.  In the ED, afebrile, BP elevated at 177/78 on room air.   Leukocytosis of 14.1, hgb of 15.5.  CMP unremarkable. Lipase within normal limits.   CT A/P with mildly dilated loops of small bowel with small bowel stool appearance. Transition point in the anterior central pelvis. Partial or developing obstruction is possible.   General surgery was consulted by EDP and he is awaiting call-back. Ng TUBE PLACED.    Review of Systems: As mentioned in the history of present illness. All other systems reviewed and are negative. Past Medical History:  Diagnosis Date   Arthritis    Asthma    Diabetes mellitus without complication (HCC)    GERD (gastroesophageal reflux disease)    Hyperlipidemia    Hypertension    Mitral regurgitation    Shortness of breath dyspnea    occ   UTI (lower urinary tract infection)    Vitamin D deficiency    Past Surgical History:  Procedure Laterality Date   BACK SURGERY  85,04   BLADDER REPAIR  95,05   BUNIONECTOMY Left 15   cholecyctectomy     CHOLECYSTECTOMY  82   COLON SURGERY  05   EXPLORATION MIDDLE  EAR Left    vertigo- no hearing now- 89 yrs old   HERNIA REPAIR  [phone]   KNEE ARTHROSCOPY     lft 02 rt 09   Social History:  reports that she quit smoking about 64 years ago. She has never used smokeless tobacco. She reports current alcohol use. She reports that she does not use drugs.  Allergies  Allergen Reactions   Atorvastatin Other (See Comments)   Ezetimibe Other (See Comments)   Jardiance [Empagliflozin]    Linagliptin     tradjenta   Metformin And Related    Pravastatin Sodium Other (See Comments)   Rosuvastatin Other (See Comments)   Simvastatin Other (See Comments)    Family History  Problem Relation Age of Onset   Cancer Mother    Heart attack Father    Coronary artery disease Father     Prior to Admission medications   Medication Sig Start Date End Date Taking? Authorizing Provider  albuterol (VENTOLIN HFA) 108 (90 Base) MCG/ACT inhaler Inhale 1-2 puffs into the lungs every 6 (six) hours as needed for wheezing or shortness of breath. 07/25/22   Larose Kells, MD  atorvastatin (LIPITOR) 10 MG tablet  12/27/17   [provider]  Cholecalciferol (VITAMIN D PO) Take 3,000 Units by mouth daily.     [provider]  diltiazem (DILACOR XR) 180 MG 24 hr capsule Take 180 mg  by mouth daily.    [provider]  famotidine (PEPCID) 40 MG tablet TAKE ONE TABLET IN THE EVENING 04/28/22   Althea Charon, FNP  fluticasone [facility]) 50 MCG/ACT nasal spray Place 2 sprays into both nostrils daily. 07/25/22   Larose Kells, MD  furosemide (LASIX) 40 MG tablet Take 40 mg by mouth every other day. 08/31/16   [provider]  glucose blood (ONETOUCH VERIO) test strip CHECK BLOOD SUGAR TWICE DAILY AS DIRECTED.    [provider]  guaiFENesin (MUCINEX) 600 MG 12 hr tablet Take by mouth 2 (two) times daily as needed.    [provider]  Insulin Glargine (BASAGLAR KWIKPEN) 100 UNIT/ML Inject 12 Units into the skin daily.    [provider]  Lancets MISC twice a day as directed 03/16/20   [provider]  mometasone (ASMANEX, 30 METERED DOSES,) 220 MCG/ACT inhaler INHALE 1 PUFF INTO LUNGS DAILY. Rinse mouth out afterwards 07/25/22   Larose Kells, MD  omeprazole (PRILOSEC) 20 MG capsule TAKE ONE CAPSULE IN THE MORNING 07/17/22   Kozlow, Donnamarie Poag, MD  polyethylene glycol powder (MIRALAX) 17 GM/SCOOP powder Use 1-2 caps in 8 oz liquid up to 3 times a day as needed until you are having regular bowel movements. 05/15/20   Caccavale, Sophia, PA-C  Potassium Chloride CR (MICRO-K) 8 MEQ CPCR capsule CR Take 8 mEq by mouth daily.     [provider]  SURE COMFORT PEN NEEDLES 31G X 8 MM [place] 11/21/20   [provider]  telmisartan (MICARDIS) 80 MG tablet Take 80 mg by mouth daily.    [provider]    Physical Exam: Vitals:   11/03/22 1743 11/03/22 1900 11/03/22 2000 11/03/22 2113  BP: (!) 184/76 (!) 173/100 (!) 171/76 (!) 177/79  Pulse: 73 85 79 79  Resp: 16   16  Temp: 98.2 F (36.8 C) (!) 97 F (36.1 C)  98.1 F (36.7 C)  TempSrc: Oral   Oral  SpO2: 100% 98% 100% 100%  Weight:       Constitutional: NAD, calm, comfortable, non toxic appearing elderly female sitting upright in bed Eyes: lids and conjunctivae normal ENMT: Mucous membranes are moist.Nasal drainage with NG tube in place. Neck: normal, supple Respiratory: clear to auscultation bilaterally, no wheezing, no crackles. Normal respiratory effort. No accessory muscle use.  Cardiovascular: Regular rate and rhythm, no murmurs / rubs / gallops. No extremity edema.  Abdomen: soft, no tenderness, with moderate distention Musculoskeletal: no clubbing / cyanosis. No joint deformity upper and lower extremities.  Normal muscle tone.  Skin: no rashes, lesions, ulcers. Neurologic: CN 2-12 grossly intact.  Psychiatric: Normal judgment and insight. Alert and oriented x 3. Normal mood. Data Reviewed:  See  HPI  Assessment and Plan: * SBO (small bowel obstruction) ([place]) -CT A/P with mildly dilated loops of small bowel with small bowel stool appearance. Transition point in the anterior central pelvis. Partial or developing obstruction is possible.  -Last Bowel movement this morning was minimal  -Had one episode of vomiting in Triage. NG tube now placed to low intermittent suction.  -General surgery was consulted by EDP and EDP was awaiting call back at time of admission.  -keep NPO  Type 2 diabetes mellitus with other specified complication ([place]) -Home regimen on 12 units of Basaglar daily but will hold on any insulin while NPO  Hypercholesterolemia -Pt cannot recall if she is on a statin  Essential hypertension -elevated -keep on  PRN IV hydralazine with perimeters while NPO -resume ACE-I, lasix and diltiazem when able to take oral      Advance Care Planning: Full  Consults: General surgery- EDP was awaiting call back at time of admission  Family Communication: no family at bedside  Severity of Illness: The appropriate patient status for this patient is INPATIENT. Inpatient status is judged to be reasonable and necessary in order to provide the required intensity of service to ensure the patient's safety. The patient's presenting symptoms, physical exam findings, and initial radiographic and laboratory data in the context of their chronic comorbidities is felt to place them at high risk for further clinical deterioration. Furthermore, it is not anticipated that the patient will be medically stable for discharge from the hospital within 2 midnights of admission.   * I certify that at the point of admission it is my clinical judgment that the patient will require inpatient hospital care spanning beyond 2 midnights from the point of admission due to high intensity of service, high risk for further deterioration and high frequency of surveillance required.*  Author: Orene Desanctis,  DO 11/03/2022 9:15 PM  For on call review [email or web address].

## 2022-11-04 ENCOUNTER — Inpatient Hospital Stay (HOSPITAL_COMMUNITY): Payer: Medicare Other

## 2022-11-04 DIAGNOSIS — K56609 Unspecified intestinal obstruction, unspecified as to partial versus complete obstruction: Secondary | ICD-10-CM | POA: Diagnosis not present

## 2022-11-04 LAB — GLUCOSE, CAPILLARY
Glucose-Capillary: 117 mg/dL — ABNORMAL HIGH (ref 70–99)
Glucose-Capillary: 135 mg/dL — ABNORMAL HIGH (ref 70–99)
Glucose-Capillary: 138 mg/dL — ABNORMAL HIGH (ref 70–99)
Glucose-Capillary: 160 mg/dL — ABNORMAL HIGH (ref 70–99)

## 2022-11-04 LAB — CBC
HCT: 44.9 % (ref 36.0–46.0)
Hemoglobin: 14.3 g/dL (ref 12.0–15.0)
MCH: 30 pg (ref 26.0–34.0)
MCHC: 31.8 g/dL (ref 30.0–36.0)
MCV: 94.3 fL (ref 80.0–100.0)
Platelets: 222 10*3/uL (ref 150–400)
RBC: 4.76 MIL/uL (ref 3.87–5.11)
RDW: 13.9 % (ref 11.5–15.5)
WBC: 10.7 10*3/uL — ABNORMAL HIGH (ref 4.0–10.5)
nRBC: 0 % (ref 0.0–0.2)

## 2022-11-04 MED ORDER — INSULIN ASPART 100 UNIT/ML IJ SOLN
0.0000 [IU] | INTRAMUSCULAR | Status: DC
  Administered 2022-11-04 (×2): 1 [IU] via SUBCUTANEOUS
  Administered 2022-11-04: 2 [IU] via SUBCUTANEOUS
  Administered 2022-11-05 – 2022-11-06 (×5): 1 [IU] via SUBCUTANEOUS
  Administered 2022-11-06: 2 [IU] via SUBCUTANEOUS
  Administered 2022-11-07 (×3): 1 [IU] via SUBCUTANEOUS

## 2022-11-04 MED ORDER — DIATRIZOATE MEGLUMINE & SODIUM 66-10 % PO SOLN
90.0000 mL | Freq: Once | ORAL | Status: AC
  Administered 2022-11-04: 90 mL via NASOGASTRIC
  Filled 2022-11-04: qty 90

## 2022-11-04 MED ORDER — MORPHINE SULFATE (PF) 2 MG/ML IV SOLN
2.0000 mg | INTRAVENOUS | Status: DC | PRN
  Administered 2022-11-04 (×2): 3 mg via INTRAVENOUS
  Administered 2022-11-05 (×3): 2 mg via INTRAVENOUS
  Filled 2022-11-04: qty 1
  Filled 2022-11-04: qty 2
  Filled 2022-11-04: qty 1
  Filled 2022-11-04: qty 2
  Filled 2022-11-04 (×2): qty 1

## 2022-11-04 MED ORDER — MORPHINE SULFATE (PF) 2 MG/ML IV SOLN: 2.0000 mg | INTRAVENOUS | Status: DC | PRN

## 2022-11-04 MED ORDER — POTASSIUM CHLORIDE IN NACL 20-0.9 MEQ/L-% IV SOLN
INTRAVENOUS | Status: DC
  Filled 2022-11-04 (×10): qty 1000

## 2022-11-04 NOTE — Progress Notes (Signed)
  Progress Note   Patient: Rhonda Watkins [id number] DOB: May 15, 1932 DOA: 11/03/2022     1 DOS: the patient was seen and examined on 11/04/2022 at 9:30AM      Brief hospital course: Rhonda Watkins is an 89 y.o. F with HTN, DM, hx asthma who presented with colicky abdominal pain, CT showed SBO.        Assessment and Plan: * SBO (small bowel obstruction) (HCC) - Maintain NG - NPO and IVF - Small bowel protocol today - Appreciate Gen Surg expertise    Type 2 diabetes mellitus with other specified complication ([place]) Glucose normal  - Hold home Basaglar while n.p.o. - SS corrections q4 and adjust as needed  Hypercholesterolemia - Resume atorvastatin when able to take p.o.  Essential hypertension Blood pressure slightly elevated - Hold furosemide, irbesartan, diltiazem, resume when able to take p.o. - Continue as needed hydralazine IV for severe range pressures  Gum infection Dentist recently placed her on doxycycline 100 daily for 1 month for something, she is not sure what, maybe gingivitis? - Hold doxycycline until SBO resolves, then can resume         Subjective: Patient still having some abdominal pain, fair amount of bilious drainage under canister, no fever, no confusion.  No bowel movements yet.     Physical Exam: BP (!) 140/57 (BP Location: Left Arm)   Pulse 72   Temp 98.2 F (36.8 C) (Oral)   Resp 14   Ht 5\' 2"  (1.575 m)   Wt 64.6 kg   SpO2 96%   BMI 26.05 kg/m   Thin elderly female, lying in bed, no acute distress RRR, no murmurs, no peripheral edema Respiratory normal, lungs clear without rales or wheezes Abdomen diffusely tender, voluntary guarding, no rigidity, rebound, or distention Attention normal, responds to questions appropriately, moves all extremities with generalized weakness but symmetric strength, face symmetric, speech fluent    Data Reviewed: Discussed with general surgery team Comprehensive metabolic panel normal CBC  normal    Family Communication: son by phone    Disposition: Status is: Inpatient The patient was admitted for small bowel obstruction  She has an NG tube still will undergo small bowel protocol today  Hopefully can clamp the tube tonight or tomorrow, advance diet, discharge within a few days.        Author: Edwin Dada, MD 11/04/2022 11:26 AM  For on call review [email or web address].

## 2022-11-04 NOTE — TOC CM/SW Note (Signed)
Transition of Care (TOC) Screening Note  Patient Details  Name: Rhonda Watkins Date of Birth: 25-Dec-1931  Transition [facility] Care [facility] CM/SW Contact:    Sherie Don, LCSW Phone Number: 11/04/2022, 10:50 AM  Transition of Care Department [facility]) has reviewed patient and no TOC needs have been identified at this time. We will continue to monitor patient advancement through interdisciplinary progression rounds. If new patient transition needs arise, please place a TOC consult.

## 2022-11-04 NOTE — Hospital Course (Addendum)
Rhonda Watkins is an 89 y.o. F with HTN, DM, hx asthma who presented with colicky abdominal pain, CT showed SBO.  Failed to improve with conservative management.  Underwent exploratory laparotomy 3/28.  Consultants General surgery  Procedures Exploratory laparotomy, lysis of adhesions x 90 minutes

## 2022-11-04 NOTE — Evaluation (Signed)
Physical Therapy Evaluation Patient Details Name: Rhonda Watkins MRN: [id number] DOB: 22-Jan-1932 Today's Date: 11/04/2022  History of Present Illness  89 yo female admitted to hospital on 11/03/2022 presents to therapy secondary to weakness and functional decline attributed to N and V with episodes of abdominal pain and severe constipation. Pt has PMH of several abdominal sx, DM II, DOE, HTN, acute gum infection and on ABX, recurrent UTI, back sx and B knee arthroscopy. Pt has current dx of SBO with NG tube placement 3/25.  Clinical Impression    Pt admitted with above diagnosis. Pt reports Mod I at [facility] PLOF and reports transitioning to ALF.  Pt currently with functional limitations due to the deficits listed below (see PT Problem List). Pt in bed when PT arrived  pt reported abdominal pain and cramping. Pt required mod A for supine <> sit EOB and sit <> supine, min guard for sitting balance EOB with B LE support, mod A for STS from elevated EOB to RW with cues, mod A for standing balance with strong posterior lean, mod A and PT managing RW to side step to L to [facility]. Pt left in bed all needs in place and PT communicated with nursing staff per pt pain report and request for ice chips as well as mobility status. Pt will benefit from acute skilled PT to increase their independence and safety with mobility to allow discharge.        Recommendations for follow up therapy are one component of a multi-disciplinary discharge planning process, led by the attending physician.  Recommendations may be updated based on patient status, additional functional criteria and insurance authorization.  Follow Up Recommendations       Assistance Recommended at Discharge Frequent or constant Supervision/Assistance  Patient can return home with the following  A lot of help with walking and/or transfers;A lot of help with bathing/dressing/bathroom;Assistance with cooking/housework;Assist for transportation;Help with stairs  or ramp for entrance    Equipment Recommendations None recommended by PT (pt reports DME in home setting)  Recommendations for Other Services       Functional Status Assessment Patient has had a recent decline in their functional status and demonstrates the ability to make significant improvements in function in a reasonable and predictable amount of time.     Precautions / Restrictions Precautions Precautions: Fall Restrictions Weight Bearing Restrictions: No      Mobility  Bed Mobility Overal bed mobility: Needs Assistance Bed Mobility: Sidelying to Sit, Supine to Sit   Sidelying to sit: Mod assist Supine to sit: Mod assist     General bed mobility comments: cues, increased time, HOB elevated for supine to sit, flat for sit to supine and use of bed rails    Transfers Overall transfer level: Needs assistance   Transfers: Sit to/from Stand Sit to Stand: Mod assist           General transfer comment: cues for proper UE placement and extension posture once in standing, side step to L to [facility] at RW level with PT managing RW and providing mod A for stability    Ambulation/Gait               General Gait Details: NT due to pain and instability as well as required mod A to maintain standing balance no recliner in room  Stairs            Wheelchair Mobility    Modified Rankin (Stroke Patients Only)  Balance Overall balance assessment: Needs assistance Sitting-balance support: Feet supported Sitting balance-Leahy Scale: Fair     Standing balance support: Bilateral upper extremity supported, Reliant on assistive device for balance Standing balance-Leahy Scale: Poor Standing balance comment: strong posterior lean required mod A to maintain static standing balance with cues                             Pertinent Vitals/Pain Pain Assessment Pain Assessment: 0-10 Pain Score: 8  Pain Location: abdomen Pain Descriptors / Indicators:  Constant, Cramping, Moaning Pain Intervention(s): Limited activity within patient's tolerance, Monitored during session    Home Living Family/patient expects to be discharged to:: Private residence Living Arrangements: Alone (husband passed in 05/2022) Available Help at Discharge: Family Type of Home: House Home Access: Stairs to enter   Technical brewer of Steps: 1   Home Layout: One level Home Equipment: Conservation officer, nature (2 wheels);Grab bars - toilet;Cane - single point Additional Comments: pt reports in transition to ALF, [facility] and scheduled to close on home 3/27.    Prior Function Prior Level of Function : Independent/Modified Independent             Mobility Comments: Mod I with SPC for all ADLs, self care tasks, IADLs and drivn g       Hand Dominance        Extremity/Trunk Assessment        Lower Extremity Assessment Lower Extremity Assessment: Generalized weakness       Communication   Communication: HOH (has hearing aid on R only, indicates deaf on L)  Cognition Arousal/Alertness: Awake/alert Behavior During Therapy: WFL for tasks assessed/performed Overall Cognitive Status: Within Functional Limits for tasks assessed                                          General Comments      Exercises     Assessment/Plan    PT Assessment Patient needs continued PT services  PT Problem List Decreased strength;Decreased activity tolerance;Decreased balance;Decreased mobility;Decreased coordination;Decreased knowledge of use of DME;Pain       PT Treatment Interventions DME instruction;Gait training;Stair training;Functional mobility training;Therapeutic activities;Therapeutic exercise;Balance training;Neuromuscular re-education;Patient/family education    PT Goals (Current goals can be found in the Care Plan section)  Acute Rehab PT Goals Patient Stated Goal: to get better and sell the house and move into [facility] PT Goal  Formulation: With patient Time For Goal Achievement: 11/18/22 Potential to Achieve Goals: Good    Frequency Min 3X/week     Co-evaluation               AM-PAC PT "6 Clicks" Mobility  Outcome Measure Help needed turning from your back to your side while in a flat bed without using bedrails?: A Lot Help needed moving from lying on your back to sitting on the side of a flat bed without using bedrails?: A Lot Help needed moving to and from a bed to a chair (including a wheelchair)?: A Lot Help needed standing up from a chair using your arms (e.g., wheelchair or bedside chair)?: A Lot Help needed to walk in hospital room?: Total Help needed climbing 3-5 steps with a railing? : Total 6 Click Score: 10    End of Session Equipment Utilized During Treatment: Gait belt Activity Tolerance: Patient limited by fatigue;No increased pain Patient  left: in bed;with call bell/phone within reach Nurse Communication: Mobility status;Other (comment) (pt limited with eval due to abdominal pain and asking for ice chips) PT Visit Diagnosis: Unsteadiness on feet (R26.81);Muscle weakness (generalized) (M62.81);Difficulty in walking, not elsewhere classified (R26.2);Pain Pain - Right/Left:  (abdomen)    Time: [id number] PT Time Calculation (min) (ACUTE ONLY): 29 min   Charges:   PT Evaluation $PT Eval Low Complexity: 1 Low PT Treatments $Therapeutic Activity: 8-22 mins        Baird Lyons, PT   Adair Patter 11/04/2022, 12:28 PM

## 2022-11-04 NOTE — Progress Notes (Signed)
[facility] Progress Note     Subjective: CC:  Denies abd pain. Denies flatus or BM. Reports she is in the process of moving to an ALF and has had a lot going on lately. Has 5 children.   Objective: Vital signs in last 24 hours: Temp:  [97 F (36.1 C)-98.6 F (37 C)] 98.4 F (36.9 C) (03/26 0542) Pulse Rate:  [73-87] 74 (03/26 0542) Resp:  [16-18] 18 (03/26 0542) BP: (160-184)/(70-100) 164/70 (03/26 0542) SpO2:  [98 %-100 %] 100 % (03/26 0542) Weight:  [59 kg-64.6 kg] 64.6 kg (03/25 2115) Last BM Date : 11/03/22  Intake/Output from previous day: 03/25 0701 - 03/26 0700 In: 1100 [NG/GT:100; IV Piggyback:1000] Out: 400 [Urine:100; Emesis/NG output:300] Intake/Output this shift: No intake/output data recorded.  PE: Gen:  Alert, NAD, pleasant Card:  Regular rate and rhythm, pedal pulses 2+ BL Pulm:  Normal effort, clear to auscultation bilaterally Abd: Soft, mild distention, mild tenderness over central and left mid abdomen, no guarding or peritonitis. Previous R subcostal incision and low transverse incisions noted.   NG with 600 thick green bilious effluent in cannister  Skin: warm and dry, no rashes  Psych: A&Ox3   Lab Results:  Recent Labs    11/03/22 1423 11/04/22 0448  WBC 14.1* 10.7*  HGB 15.5* 14.3  HCT 47.9* 44.9  PLT 281 222   BMET Recent Labs    11/03/22 1423  NA 137  K 4.3  CL 101  CO2 23  GLUCOSE 115*  BUN 22  CREATININE 0.65  CALCIUM 10.2   PT/INR No results for input(s): "LABPROT", "INR" in the last 72 hours. CMP     Component Value Date/Time   NA 137 11/03/2022 1423   K 4.3 11/03/2022 1423   CL 101 11/03/2022 1423   CO2 23 11/03/2022 1423   GLUCOSE 115 (H) 11/03/2022 1423   BUN 22 11/03/2022 1423   CREATININE 0.65 11/03/2022 1423   CALCIUM 10.2 11/03/2022 1423   PROT 8.3 (H) 11/03/2022 1423   ALBUMIN 4.8 11/03/2022 1423   AST 25 11/03/2022 1423   ALT 18 11/03/2022 1423   ALKPHOS 121 11/03/2022 1423   BILITOT 1.1  11/03/2022 1423   GFRNONAA >60 11/03/2022 1423   GFRAA >60 05/15/2020 0300   Lipase     Component Value Date/Time   LIPASE 25 11/03/2022 1423       Studies/Results: DG Abd Portable 1 View  Result Date: 11/03/2022 CLINICAL DATA:  NG placement EXAM: PORTABLE ABDOMEN - 1 VIEW COMPARISON:  CT abdomen pelvis 11/03/2022 FINDINGS: Enteric tube courses below the hemidiaphragm with tip and side port overlying the gastric lumen the bowel gas pattern is normal. Right upper quadrant clips with inferior to this an 8 mm calcification that is noted to be related to the proximal pancreas/pancreatic duct on CT. Aortic calcification. Lower abdomen and pelvis collimated off view. IMPRESSION: 1. Enteric tube in good position. 2.  Aortic Atherosclerosis (ICD10-I70.0). Electronically Signed   By: Iven Finn M.D.   On: 11/03/2022 19:29   CT Abdomen Pelvis W Contrast  Result Date: 11/03/2022 CLINICAL DATA:  Crampy abdominal pain with nausea. EXAM: CT ABDOMEN AND PELVIS WITH CONTRAST TECHNIQUE: Multidetector CT imaging of the abdomen and pelvis was performed using the standard protocol following bolus administration of intravenous contrast. RADIATION DOSE REDUCTION: This exam was performed according to the departmental dose-optimization program which includes automated exposure control, adjustment of the mA and/or kV according to patient size and/or use of iterative reconstruction  technique. CONTRAST:  114mL OMNIPAQUE IOHEXOL 300 MG/ML  SOLN COMPARISON:  CT 05/15/2020.  Older exams as well. FINDINGS: Lower chest: Stable 3 mm left lower lobe lung nodule demonstrating long-term stability. No specific imaging follow-up. There is some scarring atelectatic changes along the lung bases. Coronary artery calcifications are seen. Small hiatal hernia. Slight wall thickening as well along the distal esophagus. Please correlate with any particular symptoms. Hepatobiliary: Previous cholecystectomy. Patent portal vein. There are  some tiny low-attenuation lesions identified in the liver such as left hepatic lobe lateral segment on series 2 image 13 measuring 5 mm, unchanged from previous. Likely benign cysts. No specific imaging follow-up. Pancreas: Severe atrophy of the pancreas with ductal dilatation and calcification or stone within the head of the pancreas on image 21. This could be along the duct. This was seen previously. Spleen: Normal in size without focal abnormality.  Small splenule. Adrenals/Urinary Tract: The adrenal glands are preserved. Slightly malrotated right kidney. There are some tiny low-attenuation foci noted each kidney, under a cm and too small to completely characterize. Bosniak 2 lesions. There are 2 adjacent upper pole cysts as well on the left which are simple. Largest on the prior measured 5.4 cm and today on series 7, image 9 measures 5 cm. Bosniak 1 lesions. Prominent bilateral renal sinus fat. The ureters have normal course and caliber. Preserved contours of the urinary bladder. Slight dependent air in the bladder. Please correlate for any prior instrumentation. Stomach/Bowel: Large bowel has a normal course and caliber. Mild colonic stool. There are several mildly dilated loops of small bowel with luminal air and fluid. This involves loops of jejunum and ileum. There are areas of small bowel stool appearance along the ileum as well. There appears to be a caliber change in the course of the bowel as seen on coronal image 37 of series 4, axial image 54 of series 2 and sagittal series 5, image 59. Developing or partial obstruction is possible. No pneumatosis. No portal venous gas. Stomach is nondilated. Vascular/Lymphatic: Scattered vascular calcifications are identified, moderate distribution and extent. There are areas of stenosis identified such as along the renal arteries, right common femoral artery. Please correlate with any particular symptoms including claudication or hypertension. No specific abnormal  lymph node enlargement identified in the abdomen and pelvis. Reproductive: Status post hysterectomy. No adnexal masses. Other: Anasarca. There is thickening along the anterior abdominal wall. Mild nonspecific mesenteric haziness. No ascites. Musculoskeletal: Streak artifact related to the patient's spinal fixation hardware along the lumbar spine with laminectomy. Moderate multilevel degenerative changes. There is significant disc bulging at levels of the lumbar spine including L5-S1 and L1-2 with stenosis. IMPRESSION: Mildly dilated loops of small bowel with small bowel stool appearance. Transition point in the anterior central pelvis. Partial or developing obstruction is possible. No free air, fluid or pneumatosis. Colonic diverticula. Surgical changes from cholecystectomy and along the lumbar spine. Small hiatal hernia with wall thickening along the lower esophagus. Please correlate with any particular symptoms. Severe atrophy of the pancreas with ductal dilatation and progressive calcifications along the pancreatic head. Possibly along the course of the main pancreatic duct. Electronically Signed   By: Jill Side M.D.   On: 11/03/2022 16:43    Anti-infectives: Anti-infectives (From admission, onward)    None        Assessment/Plan  SBO, likely due to adhesion from mulltiple previous surgeries  - surgical history (based on chart review and surgical scars) - open cholecystectomy, bladder repair, ventral hernia repair  -  no emergent surgical needs today. SBO protocol today. Continue NG to LIWS and await bowel function    LOS: 1 day   I reviewed nursing notes, hospitalist notes, last 24 h vitals and pain scores, last 48 h intake and output, last 24 h labs and trends, and last 24 h imaging results.  This care required moderate level of medical decision making.   Obie Dredge, PA-C [facility] Please see Amion for pager number during day hours 7:00am-4:30pm

## 2022-11-05 ENCOUNTER — Inpatient Hospital Stay (HOSPITAL_COMMUNITY): Payer: Medicare Other

## 2022-11-05 DIAGNOSIS — Z794 Long term (current) use of insulin: Secondary | ICD-10-CM | POA: Diagnosis not present

## 2022-11-05 DIAGNOSIS — K56609 Unspecified intestinal obstruction, unspecified as to partial versus complete obstruction: Secondary | ICD-10-CM | POA: Diagnosis not present

## 2022-11-05 DIAGNOSIS — I10 Essential (primary) hypertension: Secondary | ICD-10-CM | POA: Diagnosis not present

## 2022-11-05 DIAGNOSIS — E1169 Type 2 diabetes mellitus with other specified complication: Secondary | ICD-10-CM | POA: Diagnosis not present

## 2022-11-05 LAB — BASIC METABOLIC PANEL
Anion gap: 9 (ref 5–15)
BUN: 14 mg/dL (ref 8–23)
CO2: 23 mmol/L (ref 22–32)
Calcium: 9.4 mg/dL (ref 8.9–10.3)
Chloride: 112 mmol/L — ABNORMAL HIGH (ref 98–111)
Creatinine, Ser: 0.67 mg/dL (ref 0.44–1.00)
GFR, Estimated: >60 mL/min (ref >60)
Glucose, Bld: 106 mg/dL — ABNORMAL HIGH (ref 70–99)
Potassium: 4 mmol/L (ref 3.5–5.1)
Sodium: 144 mmol/L (ref 135–145)

## 2022-11-05 LAB — CBC
HCT: 46.6 % — ABNORMAL HIGH (ref 36.0–46.0)
Hemoglobin: 14.3 g/dL (ref 12.0–15.0)
MCH: 30 pg (ref 26.0–34.0)
MCHC: 30.7 g/dL (ref 30.0–36.0)
MCV: 97.7 fL (ref 80.0–100.0)
Platelets: 221 10*3/uL (ref 150–400)
RBC: 4.77 MIL/uL (ref 3.87–5.11)
RDW: 14.4 % (ref 11.5–15.5)
WBC: 13.6 10*3/uL — ABNORMAL HIGH (ref 4.0–10.5)
nRBC: 0 % (ref 0.0–0.2)

## 2022-11-05 LAB — GLUCOSE, CAPILLARY
Glucose-Capillary: 114 mg/dL — ABNORMAL HIGH (ref 70–99)
Glucose-Capillary: 117 mg/dL — ABNORMAL HIGH (ref 70–99)
Glucose-Capillary: 121 mg/dL — ABNORMAL HIGH (ref 70–99)
Glucose-Capillary: 123 mg/dL — ABNORMAL HIGH (ref 70–99)
Glucose-Capillary: 131 mg/dL — ABNORMAL HIGH (ref 70–99)
Glucose-Capillary: 98 mg/dL (ref 70–99)

## 2022-11-05 MED ORDER — BISACODYL 10 MG RE SUPP
10.0000 mg | Freq: Once | RECTAL | Status: AC
  Administered 2022-11-05: 10 mg via RECTAL
  Filled 2022-11-05: qty 1

## 2022-11-05 MED ORDER — ENOXAPARIN SODIUM 40 MG/0.4ML IJ SOSY
40.0000 mg | PREFILLED_SYRINGE | Freq: Every day | INTRAMUSCULAR | Status: DC
  Administered 2022-11-05 – 2022-11-06 (×2): 40 mg via SUBCUTANEOUS
  Filled 2022-11-05 (×2): qty 0.4

## 2022-11-05 NOTE — Progress Notes (Signed)
Attempted to get pt up to chair after using BSC. Pt states she is too tired and wants to go back to bed. LPN and Tech present and helped pt back into bed. Call light within reach.

## 2022-11-05 NOTE — Progress Notes (Signed)
  Progress Note   Patient: Rhonda Watkins [id number] DOB: 27-Aug-1931 DOA: 11/03/2022     2 DOS: the patient was seen and examined on 11/05/2022   Brief hospital course: Rhonda Watkins is an 89 y.o. F with HTN, DM, hx asthma who presented with colicky abdominal pain, CT showed SBO.  Assessment and Plan: * SBO (small bowel obstruction) ([place]) Unfortunately not improving.  Continue NG tube, IV fluids.  Continue management per general surgery, may need operative intervention.  Type 2 diabetes mellitus with other specified complication (HCC) CBG stable.  Holding home [place] while n.p.o.  Continue sliding scale insulin.  Hypercholesterolemia Resume atorvastatin when able to take p.o.  Essential hypertension Blood pressure slightly elevated, but stable, likely due to acute issues. Holding furosemide, irbesartan, diltiazem, resume when able to take p.o.     Subjective:  Has some abdominal pain  Physical Exam: Vitals:   11/04/22 2028 11/04/22 2338 11/05/22 0515 11/05/22 1121  BP: (!) 168/80 (!) 148/63 (!) 167/70 (!) 179/79  Pulse: 85 92 92 93  Resp: 18 18 18 17   Temp: 99 F (37.2 C) 97.6 F (36.4 C) (!) 97.4 F (36.3 C) 98.5 F (36.9 C)  TempSrc: Oral Oral Oral Oral  SpO2: 96% 96% 97% 96%  Weight:      Height:       Physical Exam Vitals reviewed.  Constitutional:      General: She is not in acute distress.    Appearance: She is not ill-appearing or toxic-appearing.  Cardiovascular:     Rate and Rhythm: Normal rate and regular rhythm.     Heart sounds: No murmur heard. Pulmonary:     Effort: Pulmonary effort is normal. No respiratory distress.     Breath sounds: No wheezing, rhonchi or rales.  Neurological:     Mental Status: She is alert.  Psychiatric:        Mood and Affect: Mood normal.        Behavior: Behavior normal.   Data Reviewed: CBG stable BMP noted CBC noted  Family Communication: none  Disposition: Status is: Inpatient Remains inpatient  appropriate because: SBO   Planned Discharge Destination: Home    Time spent: 20 minutes  Author: Murray Hodgkins, MD 11/05/2022 2:14 PM  For on call review [email or web address].

## 2022-11-05 NOTE — Progress Notes (Signed)
[facility] Progress Note     Subjective: Abdominal pain overall improving, some intermittent cramping but much milder than previously. Wants NGT out. No flatus or BM. Agreeable to suppository today. Has not been out of bed.   Objective: Vital signs in last 24 hours: Temp:  [97.4 F (36.3 C)-99 F (37.2 C)] 97.4 F (36.3 C) (03/27 0515) Pulse Rate:  [79-92] 92 (03/27 0515) Resp:  [16-18] 18 (03/27 0515) BP: (148-168)/(63-80) 167/70 (03/27 0515) SpO2:  [96 %-99 %] 97 % (03/27 0515) Last BM Date : 11/03/22  Intake/Output from previous day: 03/26 0701 - 03/27 0700 In: 2375.1 [I.V.:2225.1; NG/GT:150] Out: 1750 [Urine:500; Emesis/NG output:1250] Intake/Output this shift: Total I/O In: 30 [P.O.:30] Out: 600 [Urine:200; Emesis/NG output:400]  PE: Gen:  Alert, NAD, pleasant Card:  Regular rate and rhythm Pulm:  Normal effort, clear to auscultation bilaterally Abd: Soft, mild distention, no ttp on exam today, BS hypoactive, NGT with bilious drainage  Psych: A&Ox3   Lab Results:  Recent Labs    11/04/22 0448 11/05/22 0511  WBC 10.7* 13.6*  HGB 14.3 14.3  HCT 44.9 46.6*  PLT 222 221    BMET Recent Labs    11/03/22 1423 11/05/22 0511  NA 137 144  K 4.3 4.0  CL 101 112*  CO2 23 23  GLUCOSE 115* 106*  BUN 22 14  CREATININE 0.65 0.67  CALCIUM 10.2 9.4    PT/INR No results for input(s): "LABPROT", "INR" in the last 72 hours. CMP     Component Value Date/Time   NA 144 11/05/2022 0511   K 4.0 11/05/2022 0511   CL 112 (H) 11/05/2022 0511   CO2 23 11/05/2022 0511   GLUCOSE 106 (H) 11/05/2022 0511   BUN 14 11/05/2022 0511   CREATININE 0.67 11/05/2022 0511   CALCIUM 9.4 11/05/2022 0511   PROT 8.3 (H) 11/03/2022 1423   ALBUMIN 4.8 11/03/2022 1423   AST 25 11/03/2022 1423   ALT 18 11/03/2022 1423   ALKPHOS 121 11/03/2022 1423   BILITOT 1.1 11/03/2022 1423   GFRNONAA >60 11/05/2022 0511   GFRAA >60 05/15/2020 0300   Lipase     Component Value  Date/Time   LIPASE 25 11/03/2022 1423       Studies/Results: DG Abd Portable 1V  Result Date: 11/05/2022 CLINICAL DATA:  Small-bowel obstruction EXAM: PORTABLE ABDOMEN - 1 VIEW COMPARISON:  Abdominal radiograph dated 11/04/2022, CT abdomen and pelvis dated 11/03/2022 FINDINGS: Enteric tube tip projects over the stomach. Nonspecific bowel gas pattern. Multiple gas-filled loops of small bowel within the left hemiabdomen moderate volume stool throughout the colon. No abnormal bowel dilation. No free air or pneumatosis. Excreted contrast material within the urinary bladder. Irregular radiodensity projects over the right upper quadrant, likely superimposition of osseous structures. Postsurgical changes from L3-4 lumbar fixation. Right upper quadrant surgical clips IMPRESSION: Nonspecific bowel gas pattern. Multiple gas-filled loops of small bowel within the left hemiabdomen without abnormal bowel dilation. Electronically Signed   By: Darrin Nipper M.D.   On: 11/05/2022 08:21   DG Abd Portable 1V-Small Bowel Obstruction Protocol-initial, 8 hr delay  Result Date: 11/04/2022 CLINICAL DATA:  Small bowel obstruction. EXAM: PORTABLE ABDOMEN - 1 VIEW COMPARISON:  November 03, 2022. FINDINGS: Nasogastric tube tip is seen in proximal stomach. Residual contrast is noted in the urinary bladder. Some contrast is noted in mildly dilated small bowel loops in the left side of the abdomen. No contrast is noted in the colon at this time. IMPRESSION: Some contrast is  noted in mildly dilated small bowel loops in the left side of the abdomen. No definite contrast is seen in the colon. Continued follow-up radiographs are recommended. Electronically Signed   By: Marijo Conception M.D.   On: 11/04/2022 19:52   DG Abd Portable 1 View  Result Date: 11/03/2022 CLINICAL DATA:  NG placement EXAM: PORTABLE ABDOMEN - 1 VIEW COMPARISON:  CT abdomen pelvis 11/03/2022 FINDINGS: Enteric tube courses below the hemidiaphragm with tip and side port  overlying the gastric lumen the bowel gas pattern is normal. Right upper quadrant clips with inferior to this an 8 mm calcification that is noted to be related to the proximal pancreas/pancreatic duct on CT. Aortic calcification. Lower abdomen and pelvis collimated off view. IMPRESSION: 1. Enteric tube in good position. 2.  Aortic Atherosclerosis (ICD10-I70.0). Electronically Signed   By: Iven Finn M.D.   On: 11/03/2022 19:29   CT Abdomen Pelvis W Contrast  Result Date: 11/03/2022 CLINICAL DATA:  Crampy abdominal pain with nausea. EXAM: CT ABDOMEN AND PELVIS WITH CONTRAST TECHNIQUE: Multidetector CT imaging of the abdomen and pelvis was performed using the standard protocol following bolus administration of intravenous contrast. RADIATION DOSE REDUCTION: This exam was performed according to the departmental dose-optimization program which includes automated exposure control, adjustment of the mA and/or kV according to patient size and/or use of iterative reconstruction technique. CONTRAST:  130mL OMNIPAQUE IOHEXOL 300 MG/ML  SOLN COMPARISON:  CT 05/15/2020.  Older exams as well. FINDINGS: Lower chest: Stable 3 mm left lower lobe lung nodule demonstrating long-term stability. No specific imaging follow-up. There is some scarring atelectatic changes along the lung bases. Coronary artery calcifications are seen. Small hiatal hernia. Slight wall thickening as well along the distal esophagus. Please correlate with any particular symptoms. Hepatobiliary: Previous cholecystectomy. Patent portal vein. There are some tiny low-attenuation lesions identified in the liver such as left hepatic lobe lateral segment on series 2 image 13 measuring 5 mm, unchanged from previous. Likely benign cysts. No specific imaging follow-up. Pancreas: Severe atrophy of the pancreas with ductal dilatation and calcification or stone within the head of the pancreas on image 21. This could be along the duct. This was seen previously.  Spleen: Normal in size without focal abnormality.  Small splenule. Adrenals/Urinary Tract: The adrenal glands are preserved. Slightly malrotated right kidney. There are some tiny low-attenuation foci noted each kidney, under a cm and too small to completely characterize. Bosniak 2 lesions. There are 2 adjacent upper pole cysts as well on the left which are simple. Largest on the prior measured 5.4 cm and today on series 7, image 9 measures 5 cm. Bosniak 1 lesions. Prominent bilateral renal sinus fat. The ureters have normal course and caliber. Preserved contours of the urinary bladder. Slight dependent air in the bladder. Please correlate for any prior instrumentation. Stomach/Bowel: Large bowel has a normal course and caliber. Mild colonic stool. There are several mildly dilated loops of small bowel with luminal air and fluid. This involves loops of jejunum and ileum. There are areas of small bowel stool appearance along the ileum as well. There appears to be a caliber change in the course of the bowel as seen on coronal image 37 of series 4, axial image 54 of series 2 and sagittal series 5, image 59. Developing or partial obstruction is possible. No pneumatosis. No portal venous gas. Stomach is nondilated. Vascular/Lymphatic: Scattered vascular calcifications are identified, moderate distribution and extent. There are areas of stenosis identified such as along the renal  arteries, right common femoral artery. Please correlate with any particular symptoms including claudication or hypertension. No specific abnormal lymph node enlargement identified in the abdomen and pelvis. Reproductive: Status post hysterectomy. No adnexal masses. Other: Anasarca. There is thickening along the anterior abdominal wall. Mild nonspecific mesenteric haziness. No ascites. Musculoskeletal: Streak artifact related to the patient's spinal fixation hardware along the lumbar spine with laminectomy. Moderate multilevel degenerative changes.  There is significant disc bulging at levels of the lumbar spine including L5-S1 and L1-2 with stenosis. IMPRESSION: Mildly dilated loops of small bowel with small bowel stool appearance. Transition point in the anterior central pelvis. Partial or developing obstruction is possible. No free air, fluid or pneumatosis. Colonic diverticula. Surgical changes from cholecystectomy and along the lumbar spine. Small hiatal hernia with wall thickening along the lower esophagus. Please correlate with any particular symptoms. Severe atrophy of the pancreas with ductal dilatation and progressive calcifications along the pancreatic head. Possibly along the course of the main pancreatic duct. Electronically Signed   By: Jill Side M.D.   On: 11/03/2022 16:43    Anti-infectives: Anti-infectives (From admission, onward)    None        Assessment/Plan  SBO, likely due to adhesion from mulltiple previous surgeries  - surgical history (based on chart review and surgical scars) - open cholecystectomy, bladder repair, ventral hernia repair  - no emergent surgical needs today.  - SBO protocol ordered yesterday - 8h delay without definite contrast in colon and mildly dilated small bowel loops noted - KUB this AM with non-specific bowel gas pattern, stool noted in colon - try suppository this AM - mobilize - will discuss with MD but possible clamp trial with sips of clears  - no indication for emergent surgical intervention  FEN: NPO, IVF per TRH, NGT to LIWS VTE: ok to have SQH or LMWH from surgery standpoint  ID: no current abx    LOS: 2 days   I reviewed nursing notes, hospitalist notes, last 24 h vitals and pain scores, last 48 h intake and output, last 24 h labs and trends, and last 24 h imaging results.  Norm Parcel, [facility] Surgery 11/05/2022, 10:21 AM Please see Amion for pager number during day hours 7:00am-4:30pm

## 2022-11-06 ENCOUNTER — Other Ambulatory Visit: Payer: Self-pay

## 2022-11-06 ENCOUNTER — Inpatient Hospital Stay (HOSPITAL_COMMUNITY): Payer: Medicare Other

## 2022-11-06 ENCOUNTER — Encounter (HOSPITAL_COMMUNITY): Payer: Self-pay | Admitting: Family Medicine

## 2022-11-06 ENCOUNTER — Inpatient Hospital Stay (HOSPITAL_COMMUNITY): Payer: Medicare Other | Admitting: Anesthesiology

## 2022-11-06 ENCOUNTER — Encounter (HOSPITAL_COMMUNITY)
Admission: EM | Disposition: A | Discharge status: Skilled Nursing Facility | Payer: Self-pay | Source: Home / Self Care | Attending: Family Medicine

## 2022-11-06 DIAGNOSIS — K565 Intestinal adhesions [bands], unspecified as to partial versus complete obstruction: Secondary | ICD-10-CM | POA: Diagnosis not present

## 2022-11-06 DIAGNOSIS — Z794 Long term (current) use of insulin: Secondary | ICD-10-CM | POA: Diagnosis not present

## 2022-11-06 DIAGNOSIS — K56609 Unspecified intestinal obstruction, unspecified as to partial versus complete obstruction: Secondary | ICD-10-CM | POA: Diagnosis not present

## 2022-11-06 DIAGNOSIS — I10 Essential (primary) hypertension: Secondary | ICD-10-CM | POA: Diagnosis not present

## 2022-11-06 DIAGNOSIS — E1169 Type 2 diabetes mellitus with other specified complication: Secondary | ICD-10-CM | POA: Diagnosis not present

## 2022-11-06 HISTORY — PX: LAPAROTOMY: SHX154

## 2022-11-06 LAB — CBC
HCT: 43.9 % (ref 36.0–46.0)
Hemoglobin: 13.6 g/dL (ref 12.0–15.0)
MCH: 31.1 pg (ref 26.0–34.0)
MCHC: 31 g/dL (ref 30.0–36.0)
MCV: 100.5 fL — ABNORMAL HIGH (ref 80.0–100.0)
Platelets: 193 10*3/uL (ref 150–400)
RBC: 4.37 MIL/uL (ref 3.87–5.11)
RDW: 14.2 % (ref 11.5–15.5)
WBC: 15.7 10*3/uL — ABNORMAL HIGH (ref 4.0–10.5)
nRBC: 0 % (ref 0.0–0.2)

## 2022-11-06 LAB — BASIC METABOLIC PANEL
Anion gap: 12 (ref 5–15)
BUN: 16 mg/dL (ref 8–23)
CO2: 18 mmol/L — ABNORMAL LOW (ref 22–32)
Calcium: 9 mg/dL (ref 8.9–10.3)
Chloride: 115 mmol/L — ABNORMAL HIGH (ref 98–111)
Creatinine, Ser: 0.55 mg/dL (ref 0.44–1.00)
GFR, Estimated: >60 mL/min (ref >60)
Glucose, Bld: 111 mg/dL — ABNORMAL HIGH (ref 70–99)
Potassium: 3.8 mmol/L (ref 3.5–5.1)
Sodium: 145 mmol/L (ref 135–145)

## 2022-11-06 LAB — GLUCOSE, CAPILLARY
Glucose-Capillary: 129 mg/dL — ABNORMAL HIGH (ref 70–99)
Glucose-Capillary: 130 mg/dL — ABNORMAL HIGH (ref 70–99)
Glucose-Capillary: 119 mg/dL — ABNORMAL HIGH (ref 70–99)
Glucose-Capillary: 131 mg/dL — ABNORMAL HIGH (ref 70–99)
Glucose-Capillary: 146 mg/dL — ABNORMAL HIGH (ref 70–99)
Glucose-Capillary: 167 mg/dL — ABNORMAL HIGH (ref 70–99)
Glucose-Capillary: 148 mg/dL — ABNORMAL HIGH (ref 70–99)

## 2022-11-06 SURGERY — LAPAROTOMY, EXPLORATORY
Anesthesia: General

## 2022-11-06 MED ORDER — PHENYLEPHRINE 80 MCG/ML (10ML) SYRINGE FOR IV PUSH (FOR BLOOD PRESSURE SUPPORT)
PREFILLED_SYRINGE | INTRAVENOUS | Status: AC
  Filled 2022-11-06: qty 10

## 2022-11-06 MED ORDER — FENTANYL CITRATE PF 50 MCG/ML IJ SOSY: 25.0000 ug | PREFILLED_SYRINGE | INTRAMUSCULAR | Status: DC | PRN

## 2022-11-06 MED ORDER — PROPOFOL 1000 MG/100ML IV EMUL
INTRAVENOUS | Status: AC
  Filled 2022-11-06: qty 100

## 2022-11-06 MED ORDER — METHOCARBAMOL 1000 MG/10ML IJ SOLN: 500.0000 mg | Freq: Four times a day (QID) | INTRAVENOUS | Status: DC | PRN

## 2022-11-06 MED ORDER — ARTIFICIAL TEARS OPHTHALMIC OINT
TOPICAL_OINTMENT | OPHTHALMIC | Status: DC | PRN
  Administered 2022-11-06: 1 via OPHTHALMIC

## 2022-11-06 MED ORDER — ARTIFICIAL TEARS OPHTHALMIC OINT
TOPICAL_OINTMENT | OPHTHALMIC | Status: AC
  Filled 2022-11-06: qty 3.5

## 2022-11-06 MED ORDER — SUGAMMADEX SODIUM 200 MG/2ML IV SOLN
INTRAVENOUS | Status: DC | PRN
  Administered 2022-11-06: 200 mg via INTRAVENOUS

## 2022-11-06 MED ORDER — LIDOCAINE 2% (20 MG/ML) 5 ML SYRINGE
INTRAMUSCULAR | Status: DC | PRN
  Administered 2022-11-06: 60 mg via INTRAVENOUS

## 2022-11-06 MED ORDER — SUCCINYLCHOLINE CHLORIDE 200 MG/10ML IV SOSY
PREFILLED_SYRINGE | INTRAVENOUS | Status: DC | PRN
  Administered 2022-11-06: 120 mg via INTRAVENOUS

## 2022-11-06 MED ORDER — EPHEDRINE 5 MG/ML INJ
INTRAVENOUS | Status: AC
  Filled 2022-11-06: qty 5

## 2022-11-06 MED ORDER — LACTATED RINGERS IV SOLN: INTRAVENOUS | Status: DC

## 2022-11-06 MED ORDER — PHENYLEPHRINE HCL (PRESSORS) 10 MG/ML IV SOLN
INTRAVENOUS | Status: AC
  Filled 2022-11-06: qty 1

## 2022-11-06 MED ORDER — ACETAMINOPHEN 10 MG/ML IV SOLN
1000.0000 mg | Freq: Four times a day (QID) | INTRAVENOUS | Status: AC
  Administered 2022-11-06 – 2022-11-07 (×4): 1000 mg via INTRAVENOUS
  Filled 2022-11-06 (×4): qty 100

## 2022-11-06 MED ORDER — ONDANSETRON HCL 4 MG/2ML IJ SOLN
INTRAMUSCULAR | Status: DC | PRN
  Administered 2022-11-06: 4 mg via INTRAVENOUS

## 2022-11-06 MED ORDER — 0.9 % SODIUM CHLORIDE (POUR BTL) OPTIME
TOPICAL | Status: DC | PRN
  Administered 2022-11-06: 2000 mL

## 2022-11-06 MED ORDER — HYDROMORPHONE HCL 1 MG/ML IJ SOLN
0.2500 mg | INTRAMUSCULAR | Status: DC | PRN
  Administered 2022-11-06: 0.5 mg via INTRAVENOUS

## 2022-11-06 MED ORDER — ROCURONIUM BROMIDE 10 MG/ML (PF) SYRINGE
PREFILLED_SYRINGE | INTRAVENOUS | Status: AC
  Filled 2022-11-06: qty 10

## 2022-11-06 MED ORDER — PHENOL 1.4 % MT LIQD
1.0000 | OROMUCOSAL | Status: DC | PRN
  Administered 2022-11-06: 1 via OROMUCOSAL
  Filled 2022-11-06: qty 177

## 2022-11-06 MED ORDER — ACETAMINOPHEN 10 MG/ML IV SOLN
INTRAVENOUS | Status: DC | PRN
  Administered 2022-11-06: 1000 mg via INTRAVENOUS

## 2022-11-06 MED ORDER — ROCURONIUM BROMIDE 10 MG/ML (PF) SYRINGE
PREFILLED_SYRINGE | INTRAVENOUS | Status: DC | PRN
  Administered 2022-11-06: 40 mg via INTRAVENOUS
  Administered 2022-11-06 (×2): 10 mg via INTRAVENOUS

## 2022-11-06 MED ORDER — KETOROLAC TROMETHAMINE 30 MG/ML IJ SOLN
INTRAMUSCULAR | Status: AC
  Filled 2022-11-06: qty 1

## 2022-11-06 MED ORDER — HYDROMORPHONE HCL 1 MG/ML IJ SOLN
INTRAMUSCULAR | Status: AC
  Filled 2022-11-06: qty 1

## 2022-11-06 MED ORDER — FENTANYL CITRATE (PF) 100 MCG/2ML IJ SOLN
INTRAMUSCULAR | Status: AC
  Filled 2022-11-06: qty 2

## 2022-11-06 MED ORDER — PROPOFOL 10 MG/ML IV BOLUS
INTRAVENOUS | Status: AC
  Filled 2022-11-06: qty 20

## 2022-11-06 MED ORDER — HYDROMORPHONE HCL 1 MG/ML IJ SOLN
0.5000 mg | INTRAMUSCULAR | Status: DC | PRN
  Administered 2022-11-07 – 2022-11-10 (×5): 0.5 mg via INTRAVENOUS
  Filled 2022-11-06 (×5): qty 0.5

## 2022-11-06 MED ORDER — LIDOCAINE HCL (PF) 2 % IJ SOLN
INTRAMUSCULAR | Status: AC
  Filled 2022-11-06: qty 5

## 2022-11-06 MED ORDER — PROPOFOL 10 MG/ML IV BOLUS
INTRAVENOUS | Status: DC | PRN
  Administered 2022-11-06: 50 mg via INTRAVENOUS
  Administered 2022-11-06: 100 mg via INTRAVENOUS

## 2022-11-06 MED ORDER — DEXAMETHASONE SODIUM PHOSPHATE 10 MG/ML IJ SOLN
INTRAMUSCULAR | Status: DC | PRN
  Administered 2022-11-06: 4 mg via INTRAVENOUS

## 2022-11-06 MED ORDER — PHENYLEPHRINE HCL-NACL 20-0.9 MG/250ML-% IV SOLN
INTRAVENOUS | Status: DC | PRN
  Administered 2022-11-06: 20 ug/min via INTRAVENOUS

## 2022-11-06 MED ORDER — FENTANYL CITRATE (PF) 100 MCG/2ML IJ SOLN
INTRAMUSCULAR | Status: DC | PRN
  Administered 2022-11-06 (×6): 25 ug via INTRAVENOUS

## 2022-11-06 MED ORDER — CEFAZOLIN SODIUM-DEXTROSE 2-4 GM/100ML-% IV SOLN
2.0000 g | Freq: Once | INTRAVENOUS | Status: AC
  Administered 2022-11-06: 2 g via INTRAVENOUS
  Filled 2022-11-06: qty 100

## 2022-11-06 MED ORDER — ONDANSETRON HCL 4 MG/2ML IJ SOLN
INTRAMUSCULAR | Status: AC
  Filled 2022-11-06: qty 2

## 2022-11-06 MED ORDER — PROPOFOL 500 MG/50ML IV EMUL
INTRAVENOUS | Status: DC | PRN
  Administered 2022-11-06: 125 ug/kg/min via INTRAVENOUS

## 2022-11-06 MED ORDER — ACETAMINOPHEN 10 MG/ML IV SOLN
INTRAVENOUS | Status: AC
  Filled 2022-11-06: qty 100

## 2022-11-06 SURGICAL SUPPLY — 39 items
APL PRP STRL LF DISP 70% ISPRP (MISCELLANEOUS)
BAG COUNTER SPONGE SURGICOUNT (BAG) IMPLANT
BAG SPNG CNTER NS LX DISP (BAG)
BRR ADH 5X3 SEPRAFILM 6 SHT (MISCELLANEOUS) ×1
CHLORAPREP W/TINT 26 (MISCELLANEOUS) IMPLANT
COVER MAYO STAND STRL (DRAPES) ×1 IMPLANT
COVER SURGICAL LIGHT HANDLE (MISCELLANEOUS) ×1 IMPLANT
DRAIN CHANNEL 19F RND (DRAIN) IMPLANT
DRAPE LAPAROSCOPIC ABDOMINAL (DRAPES) ×1 IMPLANT
DRSG OPSITE POSTOP 4X10 (GAUZE/BANDAGES/DRESSINGS) IMPLANT
DRSG OPSITE POSTOP 4X6 (GAUZE/BANDAGES/DRESSINGS) IMPLANT
DRSG OPSITE POSTOP 4X8 (GAUZE/BANDAGES/DRESSINGS) IMPLANT
ELECT REM PT RETURN 15FT ADLT (MISCELLANEOUS) ×1 IMPLANT
EVACUATOR SILICONE 100CC (DRAIN) IMPLANT
GLOVE BIO SURGEON STRL SZ 6 (GLOVE) ×2 IMPLANT
GLOVE INDICATOR 6.5 STRL GRN (GLOVE) ×2 IMPLANT
GLOVE SS BIOGEL STRL SZ 6 (GLOVE) ×1 IMPLANT
GOWN STRL REUS W/ TWL LRG LVL3 (GOWN DISPOSABLE) ×1 IMPLANT
GOWN STRL REUS W/ TWL XL LVL3 (GOWN DISPOSABLE) IMPLANT
GOWN STRL REUS W/TWL LRG LVL3 (GOWN DISPOSABLE) ×1
GOWN STRL REUS W/TWL XL LVL3 (GOWN DISPOSABLE)
KIT TURNOVER KIT A (KITS) IMPLANT
PACK GENERAL/GYN (CUSTOM PROCEDURE TRAY) ×1 IMPLANT
SEPRAFILM PROCEDURAL PACK 3X5 (MISCELLANEOUS) IMPLANT
STAPLER VISISTAT 35W (STAPLE) IMPLANT
SUT ETHILON 3 0 PS 1 (SUTURE) IMPLANT
SUT MNCRL AB 4-0 PS2 18 (SUTURE) IMPLANT
SUT NOVA T20/GS 25 (SUTURE) IMPLANT
SUT PDS AB 1 CT1 27 (SUTURE) IMPLANT
SUT SILK 2 0 (SUTURE) ×1
SUT SILK 2 0 SH CR/8 (SUTURE) ×1 IMPLANT
SUT SILK 2-0 18XBRD TIE 12 (SUTURE) ×1 IMPLANT
SUT SILK 3 0 (SUTURE) ×1
SUT SILK 3 0 SH CR/8 (SUTURE) ×1 IMPLANT
SUT SILK 3-0 18XBRD TIE 12 (SUTURE) ×1 IMPLANT
TOWEL OR 17X26 10 PK STRL BLUE (TOWEL DISPOSABLE) IMPLANT
TOWEL OR NON WOVEN STRL DISP B (DISPOSABLE) ×1 IMPLANT
TRAY FOLEY MTR SLVR 14FR STAT (SET/KITS/TRAYS/PACK) ×1 IMPLANT
TRAY FOLEY MTR SLVR 16FR STAT (SET/KITS/TRAYS/PACK) ×1 IMPLANT

## 2022-11-06 NOTE — Anesthesia Preprocedure Evaluation (Addendum)
Anesthesia Evaluation  Patient identified by MRN, date of birth, ID band Patient awake    Reviewed: Allergy & Precautions, NPO status , Patient's Chart, lab work & pertinent test results  Airway Mallampati: III  TM Distance: >3 FB Neck ROM: Full    Dental  (+) Dental Advisory Given, Caps   Pulmonary asthma , former smoker   Pulmonary exam normal        Cardiovascular hypertension, Pt. on medications Normal cardiovascular exam     Neuro/Psych negative neurological ROS  negative psych ROS   GI/Hepatic Neg liver ROS,GERD  Controlled and Medicated,,  Endo/Other  diabetes, Type 2, Oral Hypoglycemic Agents, Insulin Dependent    Renal/GU negative Renal ROS  negative genitourinary   Musculoskeletal  (+) Arthritis ,    Abdominal   Peds  Hematology negative hematology ROS (+)   Anesthesia Other Findings   Reproductive/Obstetrics                              Anesthesia Physical Anesthesia Plan  ASA: 2  Anesthesia Plan: General   Post-op Pain Management: Ofirmev IV (intra-op)*   Induction: Intravenous and Rapid sequence  PONV Risk Score and Plan: 3 and Ondansetron, Treatment may vary due to age or medical condition and TIVA  Airway Management Planned: Oral ETT  Additional Equipment: None  Intra-op Plan:   Post-operative Plan: Possible Post-op intubation/ventilation  Informed Consent: I have reviewed the patients History and Physical, chart, labs and discussed the procedure including the risks, benefits and alternatives for the proposed anesthesia with the patient or authorized representative who has indicated his/her understanding and acceptance.     Dental advisory given  Plan Discussed with: CRNA and Anesthesiologist  Anesthesia Plan Comments:         Anesthesia Quick Evaluation

## 2022-11-06 NOTE — Progress Notes (Signed)
Pharmacy consulted to dose TPN after 12:30 pm on 36 yoF admitted for SBO, now s/p ex lap with extensive LOA. No correctable electrolyte abnormalities noted on today's BMP; will order full TPN lab panel for tomorrow AM.  Reuel Boom, PharmD, BCPS [phone] 11/06/2022, 7:08 PM

## 2022-11-06 NOTE — Anesthesia Procedure Notes (Addendum)
Procedure Name: Intubation Date/Time: 11/06/2022 3:58 PM  Performed by: Montel Clock, CRNAPre-anesthesia Checklist: Patient identified, Emergency Drugs available, Suction available, Patient being monitored and Timeout performed Patient Re-evaluated:Patient Re-evaluated prior to induction Oxygen Delivery Method: Circle system utilized Preoxygenation: Pre-oxygenation with 100% oxygen Induction Type: IV induction, Rapid sequence and Cricoid Pressure applied Laryngoscope Size: 3 and Glidescope Grade View: Grade I Tube type: Oral Tube size: 7.0 mm Number of attempts: 2 Airway Equipment and Method: Rigid stylet Placement Confirmation: ETT inserted through vocal cords under direct vision, positive ETCO2 and breath sounds checked- equal and bilateral Secured at: 21 cm Tube secured with: Tape Dental Injury: Injury to lip  Difficulty Due To: Difficulty was unanticipated Comments: MAC 3 grade 3 view, unable to view landmarks beyond epiglottis. Glidescope 3, noted swollen area left arytenoid obstructing view, obtained grade 1 view with manipulation and ETT easily passed. Ointment applied to left upper lip lac.

## 2022-11-06 NOTE — Anesthesia Postprocedure Evaluation (Signed)
Anesthesia Post Note  Patient: Novella J Rieser  Procedure(s) Performed: EXPLORATORY LAPAROTOMY     Patient location during evaluation: PACU Anesthesia Type: General Level of consciousness: awake and alert Pain management: pain level controlled Vital Signs Assessment: post-procedure vital signs reviewed and stable Respiratory status: spontaneous breathing, nonlabored ventilation, respiratory function stable and patient connected to nasal cannula oxygen Cardiovascular status: blood pressure returned to baseline and stable Postop Assessment: no apparent nausea or vomiting Anesthetic complications: yes   Encounter Notable Events  Notable Event Outcome Phase Comment  Difficult to intubate - unexpected  Intraprocedure Filed from anesthesia note documentation.    Last Vitals:  Vitals:   11/06/22 1815 11/06/22 1830  BP: (!) 167/77 (!) 159/82  Pulse: 90 95  Resp: 20 15  Temp:    SpO2: 100% 100%    Last Pain:  Vitals:   11/06/22 1809  TempSrc:   PainSc: Glendora Mekenzie [place]

## 2022-11-06 NOTE — Progress Notes (Signed)
  Progress Note   Patient: Rhonda Watkins DOB: 20-Dec-1931 DOA: 11/03/2022     3 DOS: the patient was seen and examined on 11/06/2022   Brief hospital course: Mrs. Delgadillo is an 89 y.o. F with HTN, DM, hx asthma who presented with colicky abdominal pain, CT showed SBO.  Assessment and Plan: * SBO (small bowel obstruction) ([place]) Not improving.  Likely surgery later today.   Continue management per general surgery.     Type 2 diabetes mellitus with other specified complication (HCC) CBG remains stable.  Holding home [place] while n.p.o.  Continue sliding scale insulin.   Hypercholesterolemia Resume atorvastatin when able to take p.o.   Essential hypertension Blood pressure slightly elevated, but stable, likely due to acute issues. Holding furosemide, irbesartan, diltiazem, resume when able to take p.o.      Subjective:  Less abdominal pain today Surgery anticipated "I signed consent" Some right foot pain with movement (none now) Son at bedside, pt deaf in left ear, little hearing in right, does better with microphone (charging by sink) No BM  Physical Exam: Vitals:   11/05/22 1554 11/05/22 1952 11/06/22 0444 11/06/22 0608  BP: (!) 156/72 (!) 156/74 (!) 182/76 (!) 165/69  Pulse:  95 93 84  Resp:  18 18 18   Temp:  98.2 F (36.8 C) 98.3 F (36.8 C)   TempSrc:  Oral Oral   SpO2:  99% 100% 98%  Weight:      Height:       Physical Exam Vitals reviewed.  Constitutional:      General: She is not in acute distress.    Appearance: She is not ill-appearing or toxic-appearing.  Cardiovascular:     Rate and Rhythm: Normal rate and regular rhythm.     Heart sounds: No murmur heard. Pulmonary:     Effort: Pulmonary effort is normal. No respiratory distress.     Breath sounds: No wheezing, rhonchi or rales.  Abdominal:     General: There is no distension.     Palpations: Abdomen is soft.  Musculoskeletal:     Right lower leg: No edema.     Left lower leg: No  edema.     Comments: Mild edema over bilateral lateral malleolus, stable  Skin:    Comments: Skin of both feet appear unremarkable. Feet nontender to palpation.  Neurological:     Mental Status: She is alert.  Psychiatric:        Mood and Affect: Mood normal.        Behavior: Behavior normal.    Data Reviewed: CBG stable   Family Communication: son at bedside  Disposition: Status is: Inpatient Remains inpatient appropriate because: persistent SBO  Planned Discharge Destination:  TBD    Time spent: 25 minutes  Author: Murray Hodgkins, MD 11/06/2022 10:19 AM  For on call review [email or web address].

## 2022-11-06 NOTE — Progress Notes (Signed)
[facility] Progress Note     Subjective: No pain or cramps this morning. Small bowel movement yesterday and has been passing some flatus this morning.   Objective: Vital signs in last 24 hours: Temp:  [98.2 F (36.8 C)-98.5 F (36.9 C)] 98.3 F (36.8 C) (03/28 0444) Pulse Rate:  [84-95] 84 (03/28 0608) Resp:  [17-18] 18 (03/28 0608) BP: (156-182)/(69-79) 165/69 (03/28 0608) SpO2:  [96 %-100 %] 98 % (03/28 0608) Last BM Date : 11/05/22  Intake/Output from previous day: 03/27 0701 - 03/28 0700 In: 200 [P.O.:110; NG/GT:90] Out: 2450 [Urine:1850; Emesis/NG output:600] Intake/Output this shift: Total I/O In: 30 [P.O.:30] Out: 450 [Urine:350; Emesis/NG output:100]  PE: Gen:  Alert, NAD, pleasant Card:  Regular rate and rhythm Pulm:  Normal effort, clear to auscultation bilaterally Abd: Soft, persistent distention and diffuse tenderness, NGT with dark bilious drainage  Psych: A&Ox3   Lab Results:  Recent Labs    11/04/22 0448 11/05/22 0511  WBC 10.7* 13.6*  HGB 14.3 14.3  HCT 44.9 46.6*  PLT 222 221    BMET Recent Labs    11/03/22 1423 11/05/22 0511  NA 137 144  K 4.3 4.0  CL 101 112*  CO2 23 23  GLUCOSE 115* 106*  BUN 22 14  CREATININE 0.65 0.67  CALCIUM 10.2 9.4    PT/INR No results for input(s): "LABPROT", "INR" in the last 72 hours. CMP     Component Value Date/Time   NA 144 11/05/2022 0511   K 4.0 11/05/2022 0511   CL 112 (H) 11/05/2022 0511   CO2 23 11/05/2022 0511   GLUCOSE 106 (H) 11/05/2022 0511   BUN 14 11/05/2022 0511   CREATININE 0.67 11/05/2022 0511   CALCIUM 9.4 11/05/2022 0511   PROT 8.3 (H) 11/03/2022 1423   ALBUMIN 4.8 11/03/2022 1423   AST 25 11/03/2022 1423   ALT 18 11/03/2022 1423   ALKPHOS 121 11/03/2022 1423   BILITOT 1.1 11/03/2022 1423   GFRNONAA >60 11/05/2022 0511   GFRAA >60 05/15/2020 0300   Lipase     Component Value Date/Time   LIPASE 25 11/03/2022 1423       Studies/Results: DG Abd  Portable 1V  Result Date: 11/06/2022 CLINICAL DATA:  Small bowel obstruction. EXAM: PORTABLE ABDOMEN - 1 VIEW COMPARISON:  11/05/2022 FINDINGS: Enteric tube tip overlies the level of the stomach. There is persistent dilatation a small bowel loops, primarily within the central abdomen and LEFT mid abdomen and similar to prior study. Stool and gas are identified throughout nondilated loops of colon. There is no free intraperitoneal air. Postoperative changes in the lumbar spine. IMPRESSION: 1. Enteric tube tip overlies the stomach. 2. Persistent small bowel dilatation. 3. No free intraperitoneal air. Electronically Signed   By: Nolon Nations M.D.   On: 11/06/2022 07:36   DG Abd Portable 1V  Result Date: 11/05/2022 CLINICAL DATA:  Small-bowel obstruction EXAM: PORTABLE ABDOMEN - 1 VIEW COMPARISON:  Abdominal radiograph dated 11/04/2022, CT abdomen and pelvis dated 11/03/2022 FINDINGS: Enteric tube tip projects over the stomach. Nonspecific bowel gas pattern. Multiple gas-filled loops of small bowel within the left hemiabdomen moderate volume stool throughout the colon. No abnormal bowel dilation. No free air or pneumatosis. Excreted contrast material within the urinary bladder. Irregular radiodensity projects over the right upper quadrant, likely superimposition of osseous structures. Postsurgical changes from L3-4 lumbar fixation. Right upper quadrant surgical clips IMPRESSION: Nonspecific bowel gas pattern. Multiple gas-filled loops of small bowel within the left hemiabdomen without abnormal  bowel dilation. Electronically Signed   By: Darrin Nipper M.D.   On: 11/05/2022 08:21   DG Abd Portable 1V-Small Bowel Obstruction Protocol-initial, 8 hr delay  Result Date: 11/04/2022 CLINICAL DATA:  Small bowel obstruction. EXAM: PORTABLE ABDOMEN - 1 VIEW COMPARISON:  November 03, 2022. FINDINGS: Nasogastric tube tip is seen in proximal stomach. Residual contrast is noted in the urinary bladder. Some contrast is noted in  mildly dilated small bowel loops in the left side of the abdomen. No contrast is noted in the colon at this time. IMPRESSION: Some contrast is noted in mildly dilated small bowel loops in the left side of the abdomen. No definite contrast is seen in the colon. Continued follow-up radiographs are recommended. Electronically Signed   By: Marijo Conception M.D.   On: 11/04/2022 19:52    Anti-infectives: Anti-infectives (From admission, onward)    None        Assessment/Plan  SBO, likely due to adhesion from mulltiple previous surgeries  - surgical history (based on chart review and surgical scars) - open cholecystectomy, bladder repair, ventral hernia repair  - SBO protocol 3/26 - 8h delay without definite contrast in colon and mildly dilated small bowel loops noted - KUB this AM with persistent small bowel dilation - Some bowel function and NG output appears less but still dark bilious and she is tender - Will tentatively plan for exploratory laparotomy later today unless rapid improvement/ significant change over the course of the morning. I discussed this with her and she wishes to proceed. Discussed risks of bleeding, infection, pain, scarring, injury to bowel or other itnraabdominal structures, need for bowel resection, ileus, wound healing difficulty/hernia/dehiscence, cardiovascular/pulmonary/thromboembolic risks. I think if we wait much longer without significant improvement she is high risk from a nutritional/debility standpoint; may end up needing TPN briefly in either case.  - mobilize  FEN: NPO, IVF per TRH, NGT to LIWS VTE: ok to have SQH or LMWH from surgery standpoint  ID: no current abx    LOS: 3 days   I reviewed nursing notes, hospitalist notes, last 24 h vitals and pain scores, last 48 h intake and output, last 24 h labs and trends, and last 24 h imaging results.  Clovis Riley, MD [facility] Surgery 11/06/2022, 9:00 AM Please see Amion for pager number  during day hours 7:00am-4:30pm

## 2022-11-06 NOTE — Op Note (Signed)
Operative Note  Rhonda HEWITT  [id number]  [id number]  11/06/2022   Surgeon: Romana Juniper MD FACS   Assistant: Barkley Boards PA-C   Procedure performed: Exploratory laparotomy, lysis of adhesions x 90 minutes   Preop diagnosis: Small bowel obstruction Post-op diagnosis/intraop findings: Same, secondary to extensive intra-abdominal adhesions   Specimens: no Retained items: no  EBL: [id number] Complications: none   Description of procedure: After obtaining informed consent the patient was taken to the operating room and placed supine on operating room table where general endotracheal anesthesia was initiated, preoperative antibiotics were administered, SCDs applied, and a formal timeout was performed.  Foley catheter was inserted under sterile conditions.  The abdomen was prepped and draped in usual sterile fashion  A vertical midline laparotomy was created and cautery used to dissect the soft tissues until the fascia was encountered.  This was divided and we noted that there was intraperitoneal onlay mesh extending well above the umbilicus.  The peritoneal cavity was carefully entered and immediately encountered dense omental adhesions to the mesh as well as small bowel adhesions to the mesh.  The transverse colon is densely adherent along with omentum a couple centimeters above the superior extent of our incision and this was taken down slightly with Metzenbaums.  A meticulous and tedious lysis of adhesions ensued to free the anterior abdominal wall of adhesions until we are able to complete our incision.  There were extensive adhesions with small bowel in the pelvis, to the abdominal wall, as well as fairly extensive and dense interloop adhesions as well.  Again, carefully and meticulously these were lysed with a combination of Metzenbaums and blunt dissection, using cautery sparingly only and clear areas that were well away from the bowel.  Ultimately we were able to free all the adhesions to the  pelvis and abdominal wall to the point that we were able to eviscerate the small bowel and identify the ileocecal valve.  We then followed the small bowel proximally, carefully dividing interloop adhesions and completely freeing the small bowel as we went.  We then did the same, identifying the ligament of Treitz, and proceeded distally, carefully dissecting these adhesions.  Ultimately we were able to completely liberate the small bowel from adhesions and we are able to run the small bowel from the ileocecal valve proximally to the ligament of Treitz and confirmed that it was all completely freed and mobilized without any obstructing component remaining.  I did identify 1 expected small serosal tear on the proximal ileum which was oversewn with 3 seromuscular interrupted 3-0 silks.  Of note, she does also have multiple moderate to large jejunal diverticuli all of which appeared intact and without any evidence of perforation or inflammatory component at this time.  Once we confirmed relief of the obstruction, the abdominal cavity was irrigated with warm sterile saline.  Hemostasis was ensured.  Seprafilm was placed over the small bowel and then the incision was closed with running single strand #1 PDS starting at either end and tying centrally, incorporating the intraperitoneal mesh as we went along.  The skin was loosely closed with staples and a dressing was placed. The patient was then awakened, extubated and taken to PACU in stable condition.    All counts were correct at the completion of the case.

## 2022-11-06 NOTE — Transfer of Care (Signed)
Immediate Anesthesia Transfer of Care Note  Patient: Rhonda Watkins  Procedure(s) Performed: EXPLORATORY LAPAROTOMY  Patient Location: PACU  Anesthesia Type:General  Level of Consciousness: sedated, patient cooperative, and responds to stimulation  Airway & Oxygen Therapy: Patient Spontanous Breathing and Patient connected to face mask oxygen  Post-op Assessment: Report given to RN and Post -op Vital signs reviewed and stable  Post vital signs: Reviewed and stable  Last Vitals:  Vitals Value Taken Time  BP 159/72 11/06/22 1805  Temp    Pulse 89 11/06/22 1807  Resp 20 11/06/22 1807  SpO2 100 % 11/06/22 1807  Vitals shown include unvalidated device data.  Last Pain:  Vitals:   11/06/22 1405  TempSrc:   PainSc: 0-No pain      Patients Stated Pain Goal: 2 ([id number])  Complications:  Encounter Notable Events  Notable Event Outcome Phase Comment  Difficult to intubate - unexpected  Intraprocedure Filed from anesthesia note documentation.

## 2022-11-07 ENCOUNTER — Encounter (HOSPITAL_COMMUNITY): Payer: Self-pay | Admitting: Surgery

## 2022-11-07 DIAGNOSIS — I10 Essential (primary) hypertension: Secondary | ICD-10-CM | POA: Diagnosis not present

## 2022-11-07 DIAGNOSIS — Z794 Long term (current) use of insulin: Secondary | ICD-10-CM | POA: Diagnosis not present

## 2022-11-07 DIAGNOSIS — E1169 Type 2 diabetes mellitus with other specified complication: Secondary | ICD-10-CM | POA: Diagnosis not present

## 2022-11-07 DIAGNOSIS — K56609 Unspecified intestinal obstruction, unspecified as to partial versus complete obstruction: Secondary | ICD-10-CM | POA: Diagnosis not present

## 2022-11-07 LAB — COMPREHENSIVE METABOLIC PANEL
ALT: 12 U/L (ref 0–44)
AST: 12 U/L — ABNORMAL LOW (ref 15–41)
Albumin: 2.8 g/dL — ABNORMAL LOW (ref 3.5–5.0)
Alkaline Phosphatase: 69 U/L (ref 38–126)
Anion gap: 10 (ref 5–15)
BUN: 15 mg/dL (ref 8–23)
CO2: 16 mmol/L — ABNORMAL LOW (ref 22–32)
Calcium: 8.6 mg/dL — ABNORMAL LOW (ref 8.9–10.3)
Chloride: 114 mmol/L — ABNORMAL HIGH (ref 98–111)
Creatinine, Ser: 0.52 mg/dL (ref 0.44–1.00)
GFR, Estimated: >60 mL/min (ref >60)
Glucose, Bld: 153 mg/dL — ABNORMAL HIGH (ref 70–99)
Potassium: 4.2 mmol/L (ref 3.5–5.1)
Sodium: 140 mmol/L (ref 135–145)
Total Bilirubin: 0.9 mg/dL (ref 0.3–1.2)
Total Protein: 5.6 g/dL — ABNORMAL LOW (ref 6.5–8.1)

## 2022-11-07 LAB — CBC
HCT: 46.6 % — ABNORMAL HIGH (ref 36.0–46.0)
Hemoglobin: 13.9 g/dL (ref 12.0–15.0)
MCH: 30.7 pg (ref 26.0–34.0)
MCHC: 29.8 g/dL — ABNORMAL LOW (ref 30.0–36.0)
MCV: 102.9 fL — ABNORMAL HIGH (ref 80.0–100.0)
Platelets: 189 10*3/uL (ref 150–400)
RBC: 4.53 MIL/uL (ref 3.87–5.11)
RDW: 14 % (ref 11.5–15.5)
WBC: 12.5 10*3/uL — ABNORMAL HIGH (ref 4.0–10.5)
nRBC: 0 % (ref 0.0–0.2)

## 2022-11-07 LAB — GLUCOSE, CAPILLARY
Glucose-Capillary: 111 mg/dL — ABNORMAL HIGH (ref 70–99)
Glucose-Capillary: 146 mg/dL — ABNORMAL HIGH (ref 70–99)
Glucose-Capillary: 150 mg/dL — ABNORMAL HIGH (ref 70–99)
Glucose-Capillary: 190 mg/dL — ABNORMAL HIGH (ref 70–99)
Glucose-Capillary: 191 mg/dL — ABNORMAL HIGH (ref 70–99)

## 2022-11-07 LAB — MAGNESIUM: Magnesium: 2.3 mg/dL (ref 1.7–2.4)

## 2022-11-07 LAB — TRIGLYCERIDES: Triglycerides: 77 mg/dL (ref <150)

## 2022-11-07 LAB — PHOSPHORUS: Phosphorus: 3.6 mg/dL (ref 2.5–4.6)

## 2022-11-07 MED ORDER — TRAVASOL 10 % IV SOLN
INTRAVENOUS | Status: AC
  Filled 2022-11-07: qty 480

## 2022-11-07 MED ORDER — BUDESONIDE 0.5 MG/2ML IN SUSP
0.5000 mg | Freq: Two times a day (BID) | RESPIRATORY_TRACT | Status: DC
  Administered 2022-11-07 – 2022-11-13 (×13): 0.5 mg via RESPIRATORY_TRACT
  Filled 2022-11-07 (×13): qty 2

## 2022-11-07 MED ORDER — SODIUM CHLORIDE 0.9% FLUSH: 10.0000 mL | INTRAVENOUS | Status: DC | PRN

## 2022-11-07 MED ORDER — SODIUM CHLORIDE 0.9 % IV SOLN: INTRAVENOUS | Status: AC

## 2022-11-07 MED ORDER — PANTOPRAZOLE SODIUM 40 MG IV SOLR
40.0000 mg | INTRAVENOUS | Status: DC
  Administered 2022-11-07 – 2022-11-11 (×5): 40 mg via INTRAVENOUS
  Filled 2022-11-07 (×5): qty 10

## 2022-11-07 MED ORDER — ALBUTEROL SULFATE (2.5 MG/3ML) 0.083% IN NEBU
2.5000 mg | INHALATION_SOLUTION | Freq: Four times a day (QID) | RESPIRATORY_TRACT | Status: DC | PRN
  Administered 2022-11-09 (×2): 2.5 mg via RESPIRATORY_TRACT
  Filled 2022-11-07 (×2): qty 3

## 2022-11-07 MED ORDER — CHLORHEXIDINE GLUCONATE CLOTH 2 % EX PADS
6.0000 | MEDICATED_PAD | Freq: Every day | CUTANEOUS | Status: DC
  Administered 2022-11-07 – 2022-11-13 (×7): 6 via TOPICAL

## 2022-11-07 MED ORDER — SODIUM CHLORIDE 0.9% FLUSH
10.0000 mL | Freq: Two times a day (BID) | INTRAVENOUS | Status: DC
  Administered 2022-11-07 – 2022-11-12 (×7): 10 mL

## 2022-11-07 MED ORDER — INSULIN ASPART 100 UNIT/ML IJ SOLN
0.0000 [IU] | Freq: Four times a day (QID) | INTRAMUSCULAR | Status: DC
  Administered 2022-11-08: 2 [IU] via SUBCUTANEOUS
  Administered 2022-11-08: 3 [IU] via SUBCUTANEOUS

## 2022-11-07 NOTE — TOC Initial Note (Signed)
Transition of Care [facility]) - Initial/Assessment Note   Patient Details  Name: Rhonda Watkins MRN: [id number] Date of Birth: Feb 13, 1932  Transition of [facility]) CM/SW Contact:    Sherie Don, LCSW Phone Number: 11/07/2022, 3:06 PM  Clinical Narrative: PT evaluation recommended HHPT. Patient is agreeable to [facility] referral. CSW made referral to [facility], but did not receive a response. CSW made [facility] referral to Cindie with [facility], which was accepted. HHPT orders were placed.  Expected Discharge Plan: [place] Barriers to Discharge: Continued Medical Work up  Patient Goals and CMS Choice Patient states their goals for this hospitalization and ongoing recovery are:: Return home CMS Medicare.gov Compare Post Acute Care list provided to:: Patient Choice offered to / list presented to : Patient  Expected Discharge Plan and Services In-house Referral: Clinical Social Work Post Acute Care Choice: [place] arrangements for the past 2 months: Apartment           DME Arranged: N/A DME Agency: NA HH Arranged: PT [place] Agency: [place] Date [place]: 11/07/22 Representative spoke with at [facility]: Cindie  Prior Living Arrangements/Services Living arrangements for the past 2 months: Apartment Patient language and need for interpreter reviewed:: Yes Do you feel safe going back to the place where you live?: Yes      Need for Family Participation in Patient Care: No (Comment) Care giver support system in place?: Yes (comment) Criminal Activity/Legal Involvement Pertinent to Current Situation/Hospitalization: No - Comment as needed  Activities of Daily Living Home Assistive Devices/Equipment: None ADL Screening (condition at time of admission) Patient's cognitive ability adequate to safely complete daily activities?: Yes Is the patient deaf or have difficulty hearing?: Yes Does the patient have difficulty seeing, even when wearing  glasses/contacts?: No Does the patient have difficulty concentrating, remembering, or making decisions?: No Patient able to express need for assistance with ADLs?: Yes Does the patient have difficulty dressing or bathing?: Yes Independently performs ADLs?: No Communication: Independent Dressing (OT): Needs assistance Is this a change from baseline?: Change from baseline, expected to last <3days Grooming: Needs assistance Is this a change from baseline?: Change from baseline, expected to last <3 days Feeding: Independent Bathing: Needs assistance Is this a change from baseline?: Change from baseline, expected to last <3 days Toileting: Needs assistance Is this a change from baseline?: Change from baseline, expected to last <3 days In/Out Bed: Needs assistance Is this a change from baseline?: Change from baseline, expected to last <3 days Walks in Home: Independent Does the patient have difficulty walking or climbing stairs?: No Weakness of Legs: None Weakness of Arms/Hands: None  Permission Sought/Granted Permission sought to share information with : Other (comment) Permission granted to share information with : Yes, Verbal Permission Granted Permission granted to share info w AGENCY: [place] agencies  Emotional Assessment Appearance:: Appears stated age Attitude/Demeanor/Rapport: Engaged Affect (typically observed): Accepting Orientation: : Oriented to Self, Oriented to Place, Oriented to  Time, Oriented to Situation Alcohol / Substance Use: Not Applicable Psych Involvement: No (comment)  Admission diagnosis:  SBO (small bowel obstruction) [K56.609] Patient Active Problem List   Diagnosis Date Noted   SBO (small bowel obstruction) ([place]) 11/03/2022   Age-related osteoporosis without current pathological fracture 12/24/2020   Hyperglycemia due to type 2 diabetes mellitus ([place]) 12/24/2020   Mild persistent asthma, uncomplicated [id number]   Type 2 diabetes mellitus with other  specified complication ([place]) [id number]   Body mass index (BMI) 25.0-25.9, adult  03/09/2020   Lumbago of lumbar region with sciatica 07/14/2019   Bilateral hand pain 06/26/2017   Ganglion cyst of flexor tendon sheath of finger of right hand 06/26/2017   Primary osteoarthritis of both hands 06/26/2017   Trigger ring finger of right hand 06/26/2017   Ingrown right big toenail 05/08/2016   Displacement of cervical intervertebral disc without myelopathy 03/01/2015   Bursitis 01/30/2015   HNP (herniated nucleus pulposus), lumbar 01/12/2015   Displacement of lumbar intervertebral disc without myelopathy 02/16/2014   Essential hypertension 09/17/2013   Mitral regurgitation 09/17/2013   GERD (gastroesophageal reflux disease) 09/17/2013   Spinal stenosis 09/17/2013   Hypercholesterolemia 09/17/2013   PCP:  Kathalene Frames, MD Pharmacy:   [place], [place] [address] Phone: [phone] Fax: [phone]  Social Determinants of Health (SDOH) Social History: SDOH Screenings   Depression [phone]): Low Risk  (03/19/2020)  Tobacco Use: Medium Risk (11/07/2022)   SDOH Interventions:    Readmission Risk Interventions     No data to display

## 2022-11-07 NOTE — Progress Notes (Signed)
Initial Nutrition Assessment  DOCUMENTATION CODES:   Not applicable  INTERVENTION:  Send RPH recommendations for TPN   NUTRITION DIAGNOSIS:   Inadequate oral intake related to other (see comment), altered GI function as evidenced by NPO status.   GOAL:   Patient will meet greater than or equal to 90% of their needs   MONITOR:   Weight trends, I & O's, Labs, Diet advancement  REASON FOR ASSESSMENT:   Consult New TPN/TNA  ASSESSMENT:   89 y.o. female with PMHx including HTN, asthma, GERD, DM2, HLD and osteoporosis who was admitted for a SBO after experiencing abdominal pain and nausea  Per chart review- Severe constipation, nausea and 1 episode of emesis PTA  Husband recently passed away in 16-Jun-2023, in the process of moving    Labs: Glu 153 Meds: insulin, NaCl/Kcl infusion  Wt: no wt loss per wt hx  PO: NPO I/O's: +1.3 L   NG tube in place to LIS   Patient is s/p ex lap lysis of adhesions   Patient noted to be deaf according to sign on door, asleep at time of visit and unable to wake.   NUTRITION - FOCUSED PHYSICAL EXAM:  Unable to complete, will re-attempt on follow up   Diet Order:   Diet Order             Diet NPO time specified Except for: Ice Chips  Diet effective now                   EDUCATION NEEDS:   Not appropriate for education at this time  Skin:  Skin Assessment: Reviewed RN Assessment  Last BM:  3/27 type 5  Height:   Ht Readings from Last 1 Encounters:  11/03/22 5\' 2"  (1.575 m)    Weight:   Wt Readings from Last 1 Encounters:  11/03/22 64.6 kg     BMI:  Body mass index is 26.05 kg/m.  Estimated Nutritional Needs:   Kcal:  1600-1800  Protein:  80  Fluid:  >/= 2L    Trey Paula, RDN, LDN  Clinical Nutrition

## 2022-11-07 NOTE — Progress Notes (Signed)
Peripherally Inserted Central Catheter Placement  The IV Nurse has discussed with the patient and/or persons authorized to consent for the patient, the purpose of this procedure and the potential benefits and risks involved with this procedure.  The benefits include less needle sticks, lab draws from the catheter, and the patient may be discharged home with the catheter. Risks include, but not limited to, infection, bleeding, blood clot (thrombus formation), and puncture of an artery; nerve damage and irregular heartbeat and possibility to perform a PICC exchange if needed/ordered by physician.  Alternatives to this procedure were also discussed.  Bard Power PICC patient education guide, fact sheet on infection prevention and patient information card has been provided to patient /or left at bedside.    PICC Placement Documentation  PICC Double Lumen 11/07/22 Right Brachial 36 cm 0 cm (Active)  Indication for Insertion or Continuance of Line Administration of hyperosmolar/irritating solutions (i.e. TPN, Vancomycin, etc.) 11/07/22 1112  Exposed Catheter (cm) 0 cm 11/07/22 1112  Site Assessment Clean, Dry, Intact 11/07/22 1112  Lumen #1 Status Flushed;Saline locked;Blood return noted 11/07/22 1112  Lumen #2 Status Flushed;Saline locked;Blood return noted 11/07/22 1112  Dressing Type Transparent;Securing device 11/07/22 1112  Dressing Status Antimicrobial disc in place;Clean, Dry, Intact 11/07/22 1112  Line Care Connections checked and tightened 11/07/22 1112  Dressing Intervention New dressing 11/07/22 1112  Dressing Change Due 11/14/22 11/07/22 1112       Rhonda Watkins 11/07/2022, 11:33 AM

## 2022-11-07 NOTE — Progress Notes (Signed)
1 Day Post-Op   Subjective/Chief Complaint: Pt doing well this AM Less ad pain   Objective: Vital signs in last 24 hours: Temp:  [97.5 F (36.4 C)-98.5 F (36.9 C)] 98.2 F (36.8 C) (03/29 0334) Pulse Rate:  [72-100] 88 (03/29 0752) Resp:  [12-20] 16 (03/29 0752) BP: (138-174)/(59-92) 160/92 (03/29 0752) SpO2:  [96 %-100 %] 99 % (03/29 0752) Last BM Date : 11/05/22  Intake/Output from previous day: 03/28 0701 - 03/29 0700 In: 4780.3 [P.O.:210; I.V.:4040.3; NG/GT:130; IV Piggyback:400] Out: 2550 [Urine:2050; Emesis/NG output:300; Blood:100] Intake/Output this shift: No intake/output data recorded.  PE:  Constitutional: No acute distress, conversant, appears states age. Eyes: Anicteric sclerae, moist conjunctiva, no lid lag Lungs: Clear to auscultation bilaterally, normal respiratory effort CV: regular rate and rhythm, no murmurs, no peripheral edema, pedal pulses 2+ GI: Soft, no masses or hepatosplenomegaly, non-tender to palpation, inc c/d/i Skin: No rashes, palpation reveals normal turgor Psychiatric: appropriate judgment and insight, oriented to person, place, and time   Lab Results:  Recent Labs    11/06/22 1139 11/07/22 0419  WBC 15.7* 12.5*  HGB 13.6 13.9  HCT 43.9 46.6*  PLT 193 189   BMET Recent Labs    11/06/22 1139 11/07/22 0419  NA 145 140  K 3.8 4.2  CL 115* 114*  CO2 18* 16*  GLUCOSE 111* 153*  BUN 16 15  CREATININE 0.55 0.52  CALCIUM 9.0 8.6*   PT/INR No results for input(s): "LABPROT", "INR" in the last 72 hours. ABG No results for input(s): "PHART", "HCO3" in the last 72 hours.  Invalid input(s): "PCO2", "PO2"  Studies/Results: [place] EKG SITE RITE  Result Date: 11/06/2022 If Site Rite image not attached, placement could not be confirmed due to current cardiac rhythm.  DG Abd Portable 1V  Result Date: 11/06/2022 CLINICAL DATA:  Small bowel obstruction. EXAM: PORTABLE ABDOMEN - 1 VIEW COMPARISON:  11/05/2022 FINDINGS: Enteric tube  tip overlies the level of the stomach. There is persistent dilatation a small bowel loops, primarily within the central abdomen and LEFT mid abdomen and similar to prior study. Stool and gas are identified throughout nondilated loops of colon. There is no free intraperitoneal air. Postoperative changes in the lumbar spine. IMPRESSION: 1. Enteric tube tip overlies the stomach. 2. Persistent small bowel dilatation. 3. No free intraperitoneal air. Electronically Signed   By: Nolon Nations M.D.   On: 11/06/2022 07:36    Anti-infectives: Anti-infectives (From admission, onward)    Start     Dose/Rate Route Frequency Ordered Stop   11/06/22 1345  ceFAZolin (ANCEF) IVPB 2g/100 mL premix        2 g 200 mL/hr over 30 Minutes Intravenous  Once 11/06/22 1335 11/06/22 1610       Assessment/Plan: s/p Procedure(s): EXPLORATORY LAPAROTOMY (N/A) POD1 s/p ex lap for LOA and SBO Dr. Kae Heller 3/28  -Pt doing well with NGT  in place today -amb with assistance -await bowel fxn then will trial PO   LOS: 4 days    Ralene Ok 11/07/2022

## 2022-11-07 NOTE — Progress Notes (Signed)
Physical Therapy Treatment Patient Details Name: Rhonda Watkins MRN: [id number] DOB: [birth year] Today's Date: 11/07/2022   History of Present Illness 89 yo female admitted to hospital on 11/03/2022 presents to therapy secondary to weakness and functional decline attributed to N and V with episodes of abdominal pain and severe constipation. Pt has PMH of several abdominal sx, DM II, DOE, HTN, acute gum infection and on ABX, recurrent UTI, back sx and B knee arthroscopy. Pt has current dx of SBO with NG tube placement 3/25.    PT Comments     Pt admitted with above diagnosis.  Pt currently with functional limitations due to the deficits listed below (see PT Problem List). Pt underwent exploratory laparotomy on 3/28, NG tube remains in place and NPO. Pt presented to therapy in bed, pt sleepy and falling asleep during PT session. Pt required mod A for supine to sit, mod A for STS from EOB, min A with RW and cues for SPT to recliner, pt required A to reposition in recliner. Pt is slowly progressing toward PT goals. Pt left seated in recliner, NT aware and all needs met. Pt will benefit from acute skilled PT to increase their independence and safety with mobility to allow discharge.     Recommendations for follow up therapy are one component of a multi-disciplinary discharge planning process, led by the attending physician.  Recommendations may be updated based on patient status, additional functional criteria and insurance authorization.  Follow Up Recommendations       Assistance Recommended at Discharge Frequent or constant Supervision/Assistance  Patient can return home with the following A lot of help with walking and/or transfers;A lot of help with bathing/dressing/bathroom;Assistance with cooking/housework;Assist for transportation;Help with stairs or ramp for entrance   Equipment Recommendations  None recommended by PT (pt reports DME in home setting)    Recommendations for Other Services        Precautions / Restrictions Precautions Precautions: Fall Precaution Comments: HOH Restrictions Weight Bearing Restrictions: No     Mobility  Bed Mobility Overal bed mobility: Needs Assistance Bed Mobility: Supine to Sit     Supine to sit: Mod assist     General bed mobility comments: cues, increased time, HOB elevated for supine to sit, flat for sit to supine and use of bed rails    Transfers Overall transfer level: Needs assistance Equipment used: Rolling walker (2 wheels) Transfers: Bed to chair/wheelchair/BSC Sit to Stand: Mod assist           General transfer comment: cues for proper UE placement and extension posture once in standing, pt required min A to complete pivot to recliner    Ambulation/Gait               General Gait Details: NT   Stairs             Wheelchair Mobility    Modified Rankin (Stroke Patients Only)       Balance Overall balance assessment: Needs assistance Sitting-balance support: Feet supported Sitting balance-Leahy Scale: Fair     Standing balance support: Bilateral upper extremity supported, Reliant on assistive device for balance Standing balance-Leahy Scale: Poor Standing balance comment: no evidence of posterior lean                            Cognition Arousal/Alertness: Lethargic (pt fell asleep when PT went to get linens) Behavior During Therapy: [facility] for tasks assessed/performed Overall Cognitive Status: Within Functional  Limits for tasks assessed                                          Exercises      General Comments General comments (skin integrity, edema, etc.): pts son exited room when PT entered      Pertinent Vitals/Pain Pain Assessment Pain Assessment: Faces Faces Pain Scale: Hurts little more Pain Location: all over Pain Descriptors / Indicators: Moaning Pain Intervention(s): Limited activity within patient's tolerance, Monitored during session,  Repositioned    Home Living                          Prior Function            PT Goals (current goals can now be found in the care plan section) Acute Rehab PT Goals Patient Stated Goal: to get better and sell the house and move into [facility] PT Goal Formulation: With patient Time For Goal Achievement: 11/18/22 Potential to Achieve Goals: Good    Frequency    Min 3X/week      PT Plan Current plan remains appropriate    Co-evaluation              AM-PAC PT "6 Clicks" Mobility   Outcome Measure  Help needed turning from your back to your side while in a flat bed without using bedrails?: A Lot Help needed moving from lying on your back to sitting on the side of a flat bed without using bedrails?: A Lot Help needed moving to and from a bed to a chair (including a wheelchair)?: A Lot Help needed standing up from a chair using your arms (e.g., wheelchair or bedside chair)?: A Lot Help needed to walk in hospital room?: Total Help needed climbing 3-5 steps with a railing? : Total 6 Click Score: 10    End of Session Equipment Utilized During Treatment: Gait belt Activity Tolerance: Patient limited by fatigue Patient left: with call bell/phone within reach;in chair Nurse Communication: Mobility status PT Visit Diagnosis: Unsteadiness on feet (R26.81);Muscle weakness (generalized) (M62.81);Difficulty in walking, not elsewhere classified (R26.2);Pain Pain - Right/Left:  (abdomen)     Time: [id number] PT Time Calculation (min) (ACUTE ONLY): 24 min  Charges:  $Therapeutic Activity: 8-22 mins                     Baird Lyons, PT    Adair Patter 11/07/2022, 10:57 AM

## 2022-11-07 NOTE — Progress Notes (Signed)
PHARMACY - TOTAL PARENTERAL NUTRITION CONSULT NOTE   Indication: Prolonged ileus  Patient Measurements: Height: 5\' 2"  (157.5 cm) Weight: 64.6 kg (142 lb 6.7 oz) IBW/kg (Calculated) : 50.1 TPN AdjBW (KG): 53.7 Body mass index is 26.05 kg/m. Usual Weight:   Assessment:  Pharmacy is consulted to start TPN on 89 yo female diagnosed with SBO. Pt take to OR for SBO secondary to extensive intra-abdominal adhesions.   Glucose / Insulin:  - Received dexamethasone 10 mg intra-op  - Hx of DM, on Lantus 12 units daily PTA  - CBGs have been generally < 150 while NPO  - Received 5 units of insulin in past 24 hours  Electrolytes:  - WNl including CorrCa which is 9.6 Renal:  - SCr, BUN WNL  Hepatic:  - WNL  Intake / Output; MIVF:  - UOP 1200 mL, (+) 2550 mL on 3/28  - On NS with 20 meq KCl at 125 ml/hr  GI Imaging: - 3/25 CT of abdomen shows SBO  GI Surgeries / Procedures:  - 3/28 to OR for SBO  Central access: 3/29  TPN start date: 3/29   Nutritional Goals: Goal TPN rate is 75 mL/hr (provides 90 g of protein and 1605 kcals per day)  RD Assessment: Target Goals:  - 1600 kcal,  - 80-90g protein - >/= 1.7L of fluid per day     Current Nutrition:  NPO  Plan:  Start TPN at 40 mL/hr at 1800 Electrolytes in TPN:  Na 33mEq/L K 35mEq/L  Ca 24mEq/L  Mg 30mEq/L Phos 28mmol/L  Cl:Ac 1:1 Add standard MVI and trace elements to TPN Change SS to  Sensitive q6h SSI and adjust as needed  Reduce MIVF to 85 mL/hr at 1800. Will remove KCl from bag and change to NS  BMP, magnesium, phosphorus with AM labs  Monitor TPN labs on Mon/Thurs    Royetta Asal, PharmD, BCPS 11/07/2022 8:51 AM

## 2022-11-07 NOTE — Care Management Important Message (Signed)
Important Message  Patient Details IM Letter given. Name: Rhonda Watkins MRN: [id number] Date of Birth: 02-16-1932   Medicare Important Message Given:  Yes     Kerin Salen 11/07/2022, 1:08 PM

## 2022-11-07 NOTE — Progress Notes (Signed)
  Progress Note   Patient: Rhonda Watkins [id number] DOB: 05-22-1932 DOA: 11/03/2022     4 DOS: the patient was seen and examined on 11/07/2022   Brief hospital course: Rhonda Watkins is an 89 y.o. F with HTN, DM, hx asthma who presented with colicky abdominal pain, CT showed SBO.  Failed to improve with conservative management.  Underwent exploratory laparotomy 3/28.  Consultants General surgery  Procedures Exploratory laparotomy, lysis of adhesions x 90 minutes   Assessment and Plan: * SBO (small bowel obstruction) ([place]) Failed to improve with conservative management.  Now status post exploratory laparotomy 3/28 Continue management per general surgery.  Started on TPN.   Type 2 diabetes mellitus with other specified complication (HCC) CBG remains stable.  Holding home [employer] while n.p.o.  Continue sliding scale insulin.   Hypercholesterolemia Resume atorvastatin when able to take p.o.   Essential hypertension Blood pressure remains slightly elevated, but stable, likely due to acute issues. Holding furosemide, irbesartan, diltiazem, resume when able to take p.o.     Subjective:  Feels ok No pain  Physical Exam: Vitals:   11/07/22 0752 11/07/22 1133 11/07/22 1357 11/07/22 1442  BP: (!) 160/92 (!) 161/70 (!) 151/58   Pulse: 88 69 82   Resp: 16 16    Temp:  98 F (36.7 C) (!) 97.4 F (36.3 C)   TempSrc:  Oral Oral   SpO2: 99% (!) 89% 98% 97%  Weight:      Height:       Physical Exam Vitals reviewed.  Constitutional:      General: She is not in acute distress.    Appearance: She is not ill-appearing or toxic-appearing.  Cardiovascular:     Rate and Rhythm: Normal rate and regular rhythm.     Heart sounds: No murmur heard. Pulmonary:     Effort: Pulmonary effort is normal. No respiratory distress.     Breath sounds: No wheezing, rhonchi or rales.  Neurological:     Mental Status: She is alert.  Psychiatric:        Mood and Affect: Mood normal.         Behavior: Behavior normal.   Data Reviewed: CBG stable CMP noted CBC noted  Family Communication: none  Disposition: Status is: Inpatient Remains inpatient appropriate because: s/p surgery  Planned Discharge Destination:  TBD    Time spent: 20 minutes  Author: Murray Hodgkins, MD 11/07/2022 3:54 PM  For on call review [email or web address].

## 2022-11-08 ENCOUNTER — Inpatient Hospital Stay (HOSPITAL_COMMUNITY): Payer: Medicare Other

## 2022-11-08 DIAGNOSIS — E1169 Type 2 diabetes mellitus with other specified complication: Secondary | ICD-10-CM | POA: Diagnosis not present

## 2022-11-08 DIAGNOSIS — Z794 Long term (current) use of insulin: Secondary | ICD-10-CM | POA: Diagnosis not present

## 2022-11-08 DIAGNOSIS — I10 Essential (primary) hypertension: Secondary | ICD-10-CM | POA: Diagnosis not present

## 2022-11-08 DIAGNOSIS — K56609 Unspecified intestinal obstruction, unspecified as to partial versus complete obstruction: Secondary | ICD-10-CM | POA: Diagnosis not present

## 2022-11-08 LAB — GLUCOSE, CAPILLARY
Glucose-Capillary: 175 mg/dL — ABNORMAL HIGH (ref 70–99)
Glucose-Capillary: 183 mg/dL — ABNORMAL HIGH (ref 70–99)
Glucose-Capillary: 210 mg/dL — ABNORMAL HIGH (ref 70–99)
Glucose-Capillary: 240 mg/dL — ABNORMAL HIGH (ref 70–99)

## 2022-11-08 LAB — BASIC METABOLIC PANEL
Anion gap: 4 — ABNORMAL LOW (ref 5–15)
BUN: 17 mg/dL (ref 8–23)
CO2: 24 mmol/L (ref 22–32)
Calcium: 8.6 mg/dL — ABNORMAL LOW (ref 8.9–10.3)
Chloride: 115 mmol/L — ABNORMAL HIGH (ref 98–111)
Creatinine, Ser: 0.48 mg/dL (ref 0.44–1.00)
GFR, Estimated: >60 mL/min (ref >60)
Glucose, Bld: 208 mg/dL — ABNORMAL HIGH (ref 70–99)
Potassium: 3.7 mmol/L (ref 3.5–5.1)
Sodium: 143 mmol/L (ref 135–145)

## 2022-11-08 LAB — CBC
HCT: 40.6 % (ref 36.0–46.0)
Hemoglobin: 12.9 g/dL (ref 12.0–15.0)
MCH: 30.7 pg (ref 26.0–34.0)
MCHC: 31.8 g/dL (ref 30.0–36.0)
MCV: 96.7 fL (ref 80.0–100.0)
Platelets: 211 10*3/uL (ref 150–400)
RBC: 4.2 MIL/uL (ref 3.87–5.11)
RDW: 13.9 % (ref 11.5–15.5)
WBC: 12.4 10*3/uL — ABNORMAL HIGH (ref 4.0–10.5)
nRBC: 0 % (ref 0.0–0.2)

## 2022-11-08 LAB — MAGNESIUM: Magnesium: 2 mg/dL (ref 1.7–2.4)

## 2022-11-08 LAB — PHOSPHORUS: Phosphorus: 1.8 mg/dL — ABNORMAL LOW (ref 2.5–4.6)

## 2022-11-08 MED ORDER — POTASSIUM PHOSPHATES 15 MMOLE/5ML IV SOLN
15.0000 mmol | Freq: Once | INTRAVENOUS | Status: AC
  Administered 2022-11-08: 15 mmol via INTRAVENOUS
  Filled 2022-11-08: qty 5

## 2022-11-08 MED ORDER — INSULIN GLARGINE-YFGN 100 UNIT/ML ~~LOC~~ SOLN
5.0000 [IU] | Freq: Every day | SUBCUTANEOUS | Status: DC
  Administered 2022-11-08: 5 [IU] via SUBCUTANEOUS
  Filled 2022-11-08 (×2): qty 0.05

## 2022-11-08 MED ORDER — INSULIN ASPART 100 UNIT/ML IJ SOLN
0.0000 [IU] | Freq: Four times a day (QID) | INTRAMUSCULAR | Status: DC
  Administered 2022-11-08 – 2022-11-09 (×3): 4 [IU] via SUBCUTANEOUS
  Administered 2022-11-09: 7 [IU] via SUBCUTANEOUS
  Administered 2022-11-09: 11 [IU] via SUBCUTANEOUS
  Administered 2022-11-09 – 2022-11-10 (×2): 7 [IU] via SUBCUTANEOUS
  Administered 2022-11-10 (×2): 4 [IU] via SUBCUTANEOUS
  Administered 2022-11-10 – 2022-11-11 (×2): 7 [IU] via SUBCUTANEOUS
  Administered 2022-11-11 – 2022-11-12 (×4): 4 [IU] via SUBCUTANEOUS

## 2022-11-08 MED ORDER — INSULIN ASPART 100 UNIT/ML IJ SOLN
0.0000 [IU] | Freq: Four times a day (QID) | INTRAMUSCULAR | Status: DC
  Administered 2022-11-08: 5 [IU] via SUBCUTANEOUS

## 2022-11-08 MED ORDER — TRAVASOL 10 % IV SOLN: INTRAVENOUS | Status: DC

## 2022-11-08 MED ORDER — TRAVASOL 10 % IV SOLN
INTRAVENOUS | Status: AC
  Filled 2022-11-08: qty 540

## 2022-11-08 MED ORDER — SODIUM CHLORIDE 0.9 % IV SOLN: INTRAVENOUS | Status: DC

## 2022-11-08 NOTE — Progress Notes (Signed)
PHARMACY - TOTAL PARENTERAL NUTRITION CONSULT NOTE   Indication: Prolonged ileus  Patient Measurements: Height: 5\' 2"  (157.5 cm) Weight: 64.6 kg (142 lb 6.7 oz) IBW/kg (Calculated) : 50.1 TPN AdjBW (KG): 53.7 Body mass index is 26.05 kg/m. Usual Weight:   Assessment: 16 yoF admitted on 3/25 with SBO that failed to improve with conservative management.  She underwent exploratory laparotomy 3/28 with lysis of extensive intra-abdominal adhesions.  Pharmacy is consulted to dose TPN.    Glucose / Insulin: Hx of DM, on Lantus 12 units daily PTA  - Received dexamethasone 4mg  intra-op on 3/28 - CBGs increased and elevated since starting TPN (range: 111 - 208) - sSSI / 24 hrs:  6 units Electrolytes:  - Cl elevated, Phos low, others WNL including CorrCa 9.56 - K 3.7 below goal (>/= 4), mag at goal (>/= 2) Renal: SCr, BUN WNL Hepatic: LFTs WNL, Trig 77 (3/29) Intake / Output; MIVF:  - mIVF: NS @ 85 - I/O: NG output decreased to 175 ml, UOP adequate - Per MD, +flatus, no BM GI Imaging: 3/25 CT of abdomen shows SBO  GI Surgeries / Procedures: 3/28 ex lap for LOA and SBO   Central access: PICC placed 3/29 TPN start date: 3/29  Nutritional Goals: Goal TPN rate is 75 mL/hr (provides 81 g of protein and 1746 kcals per day)   RD Assessment: Estimated Needs Total Energy Estimated Needs: 1600-1800 Total Protein Estimated Needs: 80 Total Fluid Estimated Needs: >/= 2L  Current Nutrition:  NPO and TPN NG tube to LIWS  Plan:  KPhos 15 mmol (also provides ~22 mEq K ) Advance to TPN at 105mL/hr at 1800 Electrolytes in TPN: Na 41mEq/L, K 62mEq/L, Ca 26mEq/L, Mg 52mEq/L, and Phos 20 mmol/L. Cl:Ac 1:2 Add standard MVI and trace elements to TPN Increase to moderate SSI q6h and adjust as needed  Reduce MIVF to 75 mL/hr at 1800 (total fluid rate 125 ml/hr) Monitor TPN labs on Mon/Thurs, and PRN  Gretta Arab PharmD, BCPS [facility] main pharmacy [phone] 11/08/2022 8:51 AM

## 2022-11-08 NOTE — Progress Notes (Signed)
2 Days Post-Op   Subjective/Chief Complaint: Pt with some flatus  No BMs   Objective: Vital signs in last 24 hours: Temp:  [97.4 F (36.3 C)-98.4 F (36.9 C)] 98.4 F (36.9 C) (03/30 0700) Pulse Rate:  [69-88] 80 (03/30 0600) Resp:  [16-18] 16 (03/30 0600) BP: (151-168)/(58-92) 163/80 (03/30 0600) SpO2:  [89 %-99 %] 98 % (03/30 0600) Last BM Date : 11/05/22  Intake/Output from previous day: 03/29 0701 - 03/30 0700 In: 2390.5 [P.O.:60; I.V.:2230.5; IV Piggyback:100] Out: 1425 [Urine:1250; Emesis/NG output:175] Intake/Output this shift: Total I/O In: 0  Out: 300 [Urine:300]  PE:  Constitutional: No acute distress, conversant, appears states age. Eyes: Anicteric sclerae, moist conjunctiva, no lid lag Lungs: Clear to auscultation bilaterally, normal respiratory effort CV: regular rate and rhythm, no murmurs, no peripheral edema, pedal pulses 2+ GI: Soft, no masses or hepatosplenomegaly, approp ttp to incision, c/d/i Skin: No rashes, palpation reveals normal turgor Psychiatric: appropriate judgment and insight, oriented to person, place, and time   Lab Results:  Recent Labs    11/07/22 0419 11/08/22 0253  WBC 12.5* 12.4*  HGB 13.9 12.9  HCT 46.6* 40.6  PLT 189 211   BMET Recent Labs    11/07/22 0419 11/08/22 0253  NA 140 143  K 4.2 3.7  CL 114* 115*  CO2 16* 24  GLUCOSE 153* 208*  BUN 15 17  CREATININE 0.52 0.48  CALCIUM 8.6* 8.6*  Anti-infectives: Anti-infectives (From admission, onward)    Start     Dose/Rate Route Frequency Ordered Stop   11/06/22 1345  ceFAZolin (ANCEF) IVPB 2g/100 mL premix        2 g 200 mL/hr over 30 Minutes Intravenous  Once 11/06/22 1335 11/06/22 1610       Assessment/Plan: POD2 s/p ex lap for LOA and SBO Dr. Kae Heller 3/28   -Pt doing well with NGT in place today, con't for today -KUB today -amb with assistance -cont' TNA -await bowel fxn then will trial PO  LOS: 5 days    Ralene Ok 11/08/2022

## 2022-11-08 NOTE — Progress Notes (Signed)
  Progress Note   Patient: Rhonda Watkins [id number] DOB: 14-May-1932 DOA: 11/03/2022     5 DOS: the patient was seen and examined on 11/08/2022   Brief hospital course: Rhonda Watkins is an 89 y.o. F with HTN, DM, hx asthma who presented with colicky abdominal pain, CT showed SBO.  Failed to improve with conservative management.  Underwent exploratory laparotomy 3/28.  Consultants General surgery  Procedures Exploratory laparotomy, lysis of adhesions x 90 minutes   Assessment and Plan: * SBO (small bowel obstruction) (Rhonda Watkins) Failed to improve with conservative management.  Now status post exploratory laparotomy 3/28 Continue management per general surgery.  Continue TPN.   Type 2 diabetes mellitus with other specified complication (HCC) CBG elevated.  Will start low-dose long-acting insulin. Continue sliding scale insulin.   Hypercholesterolemia Resume atorvastatin when able to take p.o.   Essential hypertension Blood pressure remains slightly elevated, but stable, likely due to acute issues. Holding furosemide, irbesartan, diltiazem, resume when able to take p.o.  Hypophosphatemia Replete      Subjective:  Feels ok with little pain except with movement Has some right ear pain, perhaps from new hearing aide  Physical Exam: Vitals:   11/08/22 0600 11/08/22 0700 11/08/22 0913 11/08/22 1337  BP: (!) 163/80   (!) 160/66  Pulse: 80   79  Resp: 16   16  Temp:  98.4 F (36.9 C)  98.4 F (36.9 C)  TempSrc:  Axillary  Oral  SpO2: 98%  96% 97%  Weight:      Height:       Physical Exam Vitals reviewed.  Constitutional:      General: She is not in acute distress.    Appearance: She is not ill-appearing or toxic-appearing.  Cardiovascular:     Rate and Rhythm: Normal rate and regular rhythm.     Heart sounds: No murmur heard. Pulmonary:     Effort: Pulmonary effort is normal. No respiratory distress.     Breath sounds: No wheezing, rhonchi or rales.  Abdominal:      General: There is no distension.     Palpations: Abdomen is soft.  Neurological:     Mental Status: She is alert.  Psychiatric:        Mood and Affect: Mood normal.        Behavior: Behavior normal.     Data Reviewed: CBG stable Phosphorus 1.8 WBC stable 12.4  Family Communication: none  Disposition: Status is: Inpatient Remains inpatient appropriate because: s/p exploratory laparotomy   Planned Discharge Destination: Home    Time spent: 20 minutes  Author: Murray Hodgkins, MD 11/08/2022 2:33 PM  For on call review [email or web address].

## 2022-11-09 DIAGNOSIS — E1169 Type 2 diabetes mellitus with other specified complication: Secondary | ICD-10-CM | POA: Diagnosis not present

## 2022-11-09 DIAGNOSIS — Z794 Long term (current) use of insulin: Secondary | ICD-10-CM | POA: Diagnosis not present

## 2022-11-09 DIAGNOSIS — K56609 Unspecified intestinal obstruction, unspecified as to partial versus complete obstruction: Secondary | ICD-10-CM | POA: Diagnosis not present

## 2022-11-09 LAB — BASIC METABOLIC PANEL
Anion gap: 5 (ref 5–15)
BUN: 14 mg/dL (ref 8–23)
CO2: 24 mmol/L (ref 22–32)
Calcium: 8 mg/dL — ABNORMAL LOW (ref 8.9–10.3)
Chloride: 113 mmol/L — ABNORMAL HIGH (ref 98–111)
Creatinine, Ser: 0.4 mg/dL — ABNORMAL LOW (ref 0.44–1.00)
GFR, Estimated: >60 mL/min (ref >60)
Glucose, Bld: 198 mg/dL — ABNORMAL HIGH (ref 70–99)
Potassium: 3.6 mmol/L (ref 3.5–5.1)
Sodium: 142 mmol/L (ref 135–145)

## 2022-11-09 LAB — GLUCOSE, CAPILLARY
Glucose-Capillary: 196 mg/dL — ABNORMAL HIGH (ref 70–99)
Glucose-Capillary: 238 mg/dL — ABNORMAL HIGH (ref 70–99)
Glucose-Capillary: 266 mg/dL — ABNORMAL HIGH (ref 70–99)
Glucose-Capillary: 222 mg/dL — ABNORMAL HIGH (ref 70–99)

## 2022-11-09 LAB — PHOSPHORUS: Phosphorus: 2.4 mg/dL — ABNORMAL LOW (ref 2.5–4.6)

## 2022-11-09 LAB — MAGNESIUM: Magnesium: 1.9 mg/dL (ref 1.7–2.4)

## 2022-11-09 MED ORDER — TRAVASOL 10 % IV SOLN
INTRAVENOUS | Status: AC
  Filled 2022-11-09: qty 810

## 2022-11-09 MED ORDER — FUROSEMIDE 40 MG PO TABS
40.0000 mg | ORAL_TABLET | Freq: Every day | ORAL | Status: DC
  Administered 2022-11-09 – 2022-11-13 (×5): 40 mg via ORAL
  Filled 2022-11-09 (×5): qty 1

## 2022-11-09 MED ORDER — BISACODYL 10 MG RE SUPP
10.0000 mg | Freq: Once | RECTAL | Status: AC
  Administered 2022-11-09: 10 mg via RECTAL
  Filled 2022-11-09: qty 1

## 2022-11-09 MED ORDER — FAMOTIDINE 20 MG PO TABS
20.0000 mg | ORAL_TABLET | Freq: Every day | ORAL | Status: DC
  Administered 2022-11-09 – 2022-11-12 (×4): 20 mg via ORAL
  Filled 2022-11-09 (×4): qty 1

## 2022-11-09 MED ORDER — POTASSIUM PHOSPHATES 15 MMOLE/5ML IV SOLN
15.0000 mmol | Freq: Once | INTRAVENOUS | Status: AC
  Administered 2022-11-09: 15 mmol via INTRAVENOUS
  Filled 2022-11-09: qty 5

## 2022-11-09 NOTE — Progress Notes (Signed)
3 Days Post-Op   Subjective/Chief Complaint: Pt with som flatus, no BMs Some abd soreness   Objective: Vital signs in last 24 hours: Temp:  [98.4 F (36.9 C)-99.6 F (37.6 C)] 98.6 F (37 C) (03/31 0524) Pulse Rate:  [79-85] 81 (03/31 0524) Resp:  [16-20] 16 (03/31 0524) BP: (141-169)/(54-77) 169/77 (03/31 0524) SpO2:  [95 %-97 %] 95 % (03/31 0639) Last BM Date : 11/05/22  Intake/Output from previous day: 03/30 0701 - 03/31 0700 In: 3002.5 [P.O.:65; I.V.:2727.6; IV Piggyback:209.9] Out: 1760 [Urine:1700; Emesis/NG output:60] Intake/Output this shift: No intake/output data recorded.  PE:  Constitutional: No acute distress, conversant, appears states age. Eyes: Anicteric sclerae, moist conjunctiva, no lid lag Lungs: Clear to auscultation bilaterally, normal respiratory effort CV: regular rate and rhythm, no murmurs, no peripheral edema, pedal pulses 2+ GI: Soft, no masses or hepatosplenomegaly, non-tender to palpation, inc c/d/i Skin: No rashes, palpation reveals normal turgor Psychiatric: appropriate judgment and insight, oriented to person, place, and time   Lab Results:  Recent Labs    11/07/22 0419 11/08/22 0253  WBC 12.5* 12.4*  HGB 13.9 12.9  HCT 46.6* 40.6  PLT 189 211   BMET Recent Labs    11/08/22 0253 11/09/22 0247  NA 143 142  K 3.7 3.6  CL 115* 113*  CO2 24 24  GLUCOSE 208* 198*  BUN 17 14  CREATININE 0.48 0.40*  CALCIUM 8.6* 8.0*   PT/INR No results for input(s): "LABPROT", "INR" in the last 72 hours. ABG No results for input(s): "PHART", "HCO3" in the last 72 hours.  Invalid input(s): "PCO2", "PO2"  Studies/Results: DG Abd Portable 1V  Result Date: 11/08/2022 CLINICAL DATA:  [id number] Ileus [id number] EXAM: PORTABLE ABDOMEN - 1 VIEW COMPARISON:  11/06/2022 FINDINGS: Gastric tube remains in decompressed stomach. Scattered gas-filled nondilated small bowel loops, improved since previous. Residual contrast in the nondilated colon. Cholecystectomy  clips. Lumbar fixation hardware. Skin staples project near the midline over the lower abdomen. IMPRESSION: Nonobstructive bowel gas pattern. Electronically Signed   By: Lucrezia Europe M.D.   On: 11/08/2022 11:25    Anti-infectives: Anti-infectives (From admission, onward)    Start     Dose/Rate Route Frequency Ordered Stop   11/06/22 1345  ceFAZolin (ANCEF) IVPB 2g/100 mL premix        2 g 200 mL/hr over 30 Minutes Intravenous  Once 11/06/22 1335 11/06/22 1610       Assessment/Plan: s/p Procedure(s): EXPLORATORY LAPAROTOMY (N/A) Advance diet to fulls -clamp NGT And if tol PO OK to DC later today -mobilize -cont' TNA  LOS: 6 days    Ralene Ok 11/09/2022

## 2022-11-09 NOTE — Progress Notes (Signed)
  Progress Note   Patient: VERLINA SHUBIN [id number] DOB: Sep 01, 1931 DOA: 11/03/2022     6 DOS: the patient was seen and examined on 11/09/2022   Brief hospital course: Mrs. Vasques is an 89 y.o. F with HTN, DM, hx asthma who presented with colicky abdominal pain, CT showed SBO.  Failed to improve with conservative management.  Underwent exploratory laparotomy 3/28.  Consultants General surgery  Procedures Exploratory laparotomy, lysis of adhesions x 90 minutes   Assessment and Plan: * SBO (small bowel obstruction) ([place]) Failed to improve with conservative management,status post exploratory laparotomy 3/28 Continue management per general surgery.  Continue TPN. Seems to be improving   Type 2 diabetes mellitus with other specified complication (HCC) CBG remains elevated.  Discussed with pharmacy, will utilize insulin with TPN. Continue sliding scale insulin.   Hypercholesterolemia Resume atorvastatin when able to take p.o.   Essential hypertension Blood pressure remains slightly elevated, but stable,secondary to acute condition. Holding furosemide, irbesartan, diltiazem, resume when able to take p.o.   Hypophosphatemia Replete per pharmacy      Subjective:  Feels ok Some abdominal pain and nausea at times  Physical Exam: Vitals:   11/08/22 1937 11/08/22 2139 11/09/22 0524 11/09/22 0639  BP:  (!) 141/54 (!) 169/77   Pulse:  85 81   Resp:  20 16   Temp:  99.6 F (37.6 C) 98.6 F (37 C)   TempSrc:  Oral Oral   SpO2: 96% 96%  95%  Weight:      Height:       Physical Exam Vitals reviewed.  Constitutional:      General: She is not in acute distress.    Appearance: She is not ill-appearing or toxic-appearing.  Cardiovascular:     Rate and Rhythm: Normal rate and regular rhythm.     Heart sounds: No murmur heard. Pulmonary:     Effort: Pulmonary effort is normal. No respiratory distress.     Breath sounds: No wheezing, rhonchi or rales.  Neurological:      Mental Status: She is alert.  Psychiatric:        Mood and Affect: Mood normal.        Behavior: Behavior normal.    Data Reviewed: Phosphorus 2.4 > replete per pharamcy CBC stable > insulin in TPN per pharamcy  Family Communication: none present  Disposition: Status is: Inpatient Remains inpatient appropriate because: s/p exploratory laparotomy   Planned Discharge Destination: Home with Home Health    Time spent: 20 minutes  Author: Murray Hodgkins, MD 11/09/2022 10:38 AM  For on call review [email or web address].

## 2022-11-09 NOTE — Progress Notes (Signed)
Physical Therapy Treatment Patient Details Name: Rhonda Watkins MRN: [id number] DOB: [birth year] Today's Date: 11/09/2022   History of Present Illness 89 yo female admitted to hospital on 11/03/2022 presents to therapy secondary to weakness and functional decline attributed to N and V with episodes of abdominal pain and severe constipation. Pt has PMH of several abdominal sx, DM II, DOE, HTN, acute gum infection and on ABX, recurrent UTI, back sx and B knee arthroscopy. Pt has current dx of SBO with NG tube placement 3/25.    PT Comments    Pt very cooperative but fatigues easily and very unsteady.  Pt up from recliner to [facility] and up from [facility] to ambulate limited distance and assisted to bed. Pt positioned for comfort and all needs met.   Recommendations for follow up therapy are one component of a multi-disciplinary discharge planning process, led by the attending physician.  Recommendations may be updated based on patient status, additional functional criteria and insurance authorization.  Follow Up Recommendations       Assistance Recommended at Discharge Frequent or constant Supervision/Assistance  Patient can return home with the following A lot of help with walking and/or transfers;A lot of help with bathing/dressing/bathroom;Assistance with cooking/housework;Assist for transportation;Help with stairs or ramp for entrance   Equipment Recommendations  None recommended by PT    Recommendations for Other Services       Precautions / Restrictions Precautions Precautions: Fall Precaution Comments: HOH Restrictions Weight Bearing Restrictions: No     Mobility  Bed Mobility Overal bed mobility: Needs Assistance Bed Mobility: Sit to Sidelying, Rolling Rolling: Min assist, Mod assist       Sit to sidelying: Mod assist General bed mobility comments: cues for sequence with assist to manage LEs onto bed    Transfers Overall transfer level: Needs assistance Equipment used:  Rolling walker (2 wheels) Transfers: Bed to chair/wheelchair/BSC, Sit to/from Stand Sit to Stand: Min assist, +2 safety/equipment   Step pivot transfers: Min assist, +2 physical assistance, +2 safety/equipment       General transfer comment: cues for use of UEs to self assist.  Physical assist to bring wt up and fwd to balance in standing with RW; Step pvt recliner to [facility]    Ambulation/Gait Ambulation/Gait assistance: Min assist, +2 physical assistance, +2 safety/equipment Gait Distance (Feet): 5 Feet Assistive device: Rolling walker (2 wheels) Gait Pattern/deviations: Step-to pattern, Decreased step length - right, Decreased step length - left, Shuffle, Trunk flexed Gait velocity: decre     General Gait Details: 5   Stairs             Wheelchair Mobility    Modified Rankin (Stroke Patients Only)       Balance Overall balance assessment: Needs assistance Sitting-balance support: Feet supported Sitting balance-Leahy Scale: Fair     Standing balance support: Bilateral upper extremity supported, Reliant on assistive device for balance Standing balance-Leahy Scale: Poor                              Cognition Arousal/Alertness: Awake/alert Behavior During Therapy: WFL for tasks assessed/performed Overall Cognitive Status: Within Functional Limits for tasks assessed                                          Exercises      General Comments  Pertinent Vitals/Pain Pain Assessment Pain Assessment: Faces Faces Pain Scale: Hurts little more Pain Location: abdomen Pain Descriptors / Indicators: Grimacing, Guarding Pain Intervention(s): Limited activity within patient's tolerance, Monitored during session, Premedicated before session    Home Living                          Prior Function            PT Goals (current goals can now be found in the care plan section) Acute Rehab PT Goals Patient Stated Goal: to  get better and sell the house and move into [facility] PT Goal Formulation: With patient Time For Goal Achievement: 11/18/22 Potential to Achieve Goals: Good Progress towards PT goals: Progressing toward goals    Frequency    Min 3X/week      PT Plan Current plan remains appropriate    Co-evaluation              AM-PAC PT "6 Clicks" Mobility   Outcome Measure  Help needed turning from your back to your side while in a flat bed without using bedrails?: A Lot Help needed moving from lying on your back to sitting on the side of a flat bed without using bedrails?: A Lot Help needed moving to and from a bed to a chair (including a wheelchair)?: A Lot Help needed standing up from a chair using your arms (e.g., wheelchair or bedside chair)?: A Lot Help needed to walk in hospital room?: Total Help needed climbing 3-5 steps with a railing? : Total 6 Click Score: 10    End of Session Equipment Utilized During Treatment: Gait belt Activity Tolerance: Patient limited by fatigue Patient left: with call bell/phone within reach;in bed Nurse Communication: Mobility status PT Visit Diagnosis: Unsteadiness on feet (R26.81);Muscle weakness (generalized) (M62.81);Difficulty in walking, not elsewhere classified (R26.2);Pain     Time: 1140-1210 PT Time Calculation (min) (ACUTE ONLY): 30 min  Charges:  $Gait Training: 8-22 mins $Therapeutic Activity: 8-22 mins                     Debe Coder PT Acute Rehabilitation Services Pager [phone] Office [phone]    Annahi Short 11/09/2022, 1:05 PM

## 2022-11-09 NOTE — Progress Notes (Signed)
PHARMACY - TOTAL PARENTERAL NUTRITION CONSULT NOTE   Indication: Prolonged ileus  Patient Measurements: Height: 5\' 2"  (157.5 cm) Weight: 64.6 kg (142 lb 6.7 oz) IBW/kg (Calculated) : 50.1 TPN AdjBW (KG): 53.7 Body mass index is 26.05 kg/m. Usual Weight:   Assessment: 56 yoF admitted on 3/25 with SBO that failed to improve with conservative management.  She underwent exploratory laparotomy 3/28 with lysis of extensive intra-abdominal adhesions.  Pharmacy is consulted to dose TPN.    Glucose / Insulin: Hx of DM, on Lantus 12 units daily PTA  - Received dexamethasone 4mg  intra-op on 3/28 - CBGs elevated on TPN (range: 175-240) - SSI / 24 hrs:  17 units - Insulin glargine 5 units given on 3/30 afternoon, will transition to insulin in TPN bag on 3/31.   Electrolytes:  Cl elevated, Phos low, CorrCa 8.96 - K 3.6 below goal (>/= 4), mag 1.9 (>/= 2) Renal: SCr, BUN WNL Hepatic: LFTs WNL, Trig 77 (3/29) Intake / Output; MIVF:  - mIVF: NS @ 75 - I/O: NG output decreased to 60 ml, UOP adequate - Per MD, +flatus, no BM GI Imaging:  3/25 CT abdomen: SBO 3/30 Xray: Nonobstructive bowel gas pattern. GI Surgeries / Procedures: 3/28 ex lap for LOA and SBO   Central access: PICC placed 3/29 TPN start date: 3/29  Nutritional Goals: Goal TPN rate is 75 mL/hr (provides 81 g of protein and 1746 kcals per day)   RD Assessment: Estimated Needs Total Energy Estimated Needs: 1600-1800 Total Protein Estimated Needs: 80 Total Fluid Estimated Needs: >/= 2L  Current Nutrition:  Full liquids and TPN NG tube to LIWS, clamping trial on 3/31  Plan:  Now:  KPhos 15 mmol (also provides ~22 mEq K) At 18:00 Advance TPN to goal rate, 75 mL/hr at 1800 Electrolytes in TPN: Na 60mEq/L, K 32mEq/L, Ca 53mEq/L, Mg 7 mEq/L, and Phos 20 mmol/L. Cl:Ac 1:2 Add standard MVI and trace elements to TPN Continue resistant SSI q6h and adjust as needed  D/C insulin glargine while on TPN (discussed with Dr. Sarajane Jews).   Add Regular Insulin 10 units to TPN bag Reduce MIVF to 50 mL/hr at 1800 (total fluid rate 125 ml/hr) Monitor TPN labs on Mon/Thurs, and PRN  Gretta Arab PharmD, BCPS [facility] main pharmacy [phone] 11/09/2022 8:09 AM

## 2022-11-10 DIAGNOSIS — Z794 Long term (current) use of insulin: Secondary | ICD-10-CM | POA: Diagnosis not present

## 2022-11-10 DIAGNOSIS — E1169 Type 2 diabetes mellitus with other specified complication: Secondary | ICD-10-CM | POA: Diagnosis not present

## 2022-11-10 DIAGNOSIS — I10 Essential (primary) hypertension: Secondary | ICD-10-CM | POA: Diagnosis not present

## 2022-11-10 DIAGNOSIS — K56609 Unspecified intestinal obstruction, unspecified as to partial versus complete obstruction: Secondary | ICD-10-CM | POA: Diagnosis not present

## 2022-11-10 LAB — COMPREHENSIVE METABOLIC PANEL
ALT: 12 U/L (ref 0–44)
AST: 14 U/L — ABNORMAL LOW (ref 15–41)
Albumin: 2.2 g/dL — ABNORMAL LOW (ref 3.5–5.0)
Alkaline Phosphatase: 57 U/L (ref 38–126)
Anion gap: 8 (ref 5–15)
BUN: 13 mg/dL (ref 8–23)
CO2: 26 mmol/L (ref 22–32)
Calcium: 7.9 mg/dL — ABNORMAL LOW (ref 8.9–10.3)
Chloride: 104 mmol/L (ref 98–111)
Creatinine, Ser: 0.53 mg/dL (ref 0.44–1.00)
GFR, Estimated: >60 mL/min (ref >60)
Glucose, Bld: 196 mg/dL — ABNORMAL HIGH (ref 70–99)
Potassium: 3.7 mmol/L (ref 3.5–5.1)
Sodium: 138 mmol/L (ref 135–145)
Total Bilirubin: 0.5 mg/dL (ref 0.3–1.2)
Total Protein: 4.9 g/dL — ABNORMAL LOW (ref 6.5–8.1)

## 2022-11-10 LAB — CBC
HCT: 34.4 % — ABNORMAL LOW (ref 36.0–46.0)
Hemoglobin: 11 g/dL — ABNORMAL LOW (ref 12.0–15.0)
MCH: 30.4 pg (ref 26.0–34.0)
MCHC: 32 g/dL (ref 30.0–36.0)
MCV: 95 fL (ref 80.0–100.0)
Platelets: 164 10*3/uL (ref 150–400)
RBC: 3.62 MIL/uL — ABNORMAL LOW (ref 3.87–5.11)
RDW: 13.8 % (ref 11.5–15.5)
WBC: 9.3 10*3/uL (ref 4.0–10.5)
nRBC: 0 % (ref 0.0–0.2)

## 2022-11-10 LAB — HEMOGLOBIN A1C
Hgb A1c MFr Bld: 7.3 % — ABNORMAL HIGH (ref 4.8–5.6)
Mean Plasma Glucose: 163 mg/dL

## 2022-11-10 LAB — MAGNESIUM: Magnesium: 1.8 mg/dL (ref 1.7–2.4)

## 2022-11-10 LAB — GLUCOSE, CAPILLARY
Glucose-Capillary: 224 mg/dL — ABNORMAL HIGH (ref 70–99)
Glucose-Capillary: 230 mg/dL — ABNORMAL HIGH (ref 70–99)
Glucose-Capillary: 179 mg/dL — ABNORMAL HIGH (ref 70–99)
Glucose-Capillary: 165 mg/dL — ABNORMAL HIGH (ref 70–99)

## 2022-11-10 LAB — TRIGLYCERIDES: Triglycerides: 192 mg/dL — ABNORMAL HIGH (ref <150)

## 2022-11-10 LAB — PHOSPHORUS: Phosphorus: 3.6 mg/dL (ref 2.5–4.6)

## 2022-11-10 MED ORDER — ACETAMINOPHEN 325 MG PO TABS
650.0000 mg | ORAL_TABLET | Freq: Four times a day (QID) | ORAL | Status: DC
  Administered 2022-11-10 – 2022-11-13 (×13): 650 mg via ORAL
  Filled 2022-11-10 (×13): qty 2

## 2022-11-10 MED ORDER — GLUCERNA SHAKE PO LIQD
237.0000 mL | Freq: Three times a day (TID) | ORAL | Status: DC
  Administered 2022-11-10 – 2022-11-13 (×7): 237 mL via ORAL
  Filled 2022-11-10 (×10): qty 237

## 2022-11-10 MED ORDER — ENOXAPARIN SODIUM 40 MG/0.4ML IJ SOSY
40.0000 mg | PREFILLED_SYRINGE | Freq: Every day | INTRAMUSCULAR | Status: DC
  Administered 2022-11-10 – 2022-11-13 (×4): 40 mg via SUBCUTANEOUS
  Filled 2022-11-10 (×4): qty 0.4

## 2022-11-10 MED ORDER — MAGNESIUM SULFATE IN D5W 1-5 GM/100ML-% IV SOLN
1.0000 g | Freq: Once | INTRAVENOUS | Status: AC
  Administered 2022-11-10: 1 g via INTRAVENOUS
  Filled 2022-11-10: qty 100

## 2022-11-10 MED ORDER — POLYETHYLENE GLYCOL 3350 17 G PO PACK
17.0000 g | PACK | Freq: Every day | ORAL | Status: DC
  Administered 2022-11-10: 17 g via ORAL
  Filled 2022-11-10: qty 1

## 2022-11-10 MED ORDER — OXYCODONE HCL 5 MG PO TABS: 5.0000 mg | ORAL_TABLET | ORAL | Status: DC | PRN

## 2022-11-10 MED ORDER — TRAVASOL 10 % IV SOLN
INTRAVENOUS | Status: AC
  Filled 2022-11-10: qty 810

## 2022-11-10 MED ORDER — POTASSIUM CHLORIDE CRYS ER 20 MEQ PO TBCR
20.0000 meq | EXTENDED_RELEASE_TABLET | Freq: Once | ORAL | Status: AC
  Administered 2022-11-10: 20 meq via ORAL
  Filled 2022-11-10: qty 1

## 2022-11-10 NOTE — Progress Notes (Signed)
Physical Therapy Treatment Patient Details Name: Rhonda Watkins MRN: [id number] DOB: 1932/04/05 Today's Date: 11/10/2022   History of Present Illness 89 yo female admitted to hospital on 11/03/2022 presents to therapy secondary to weakness and functional decline attributed to N and V with episodes of abdominal pain and severe constipation. Pt has PMH of several abdominal sx, DM II, DOE, HTN, acute gum infection and on ABX, recurrent UTI, back sx and B knee arthroscopy. Pt has current dx of SBO with NG tube placement 3/25.    PT Comments    Pt agreeable to working with therapy. She required Min A to ambulate across the room. Pt exhibits general weakness, decreased activity tolerance, and impaired gait and balance. Discussed d/c plan-she is planning to return to her Ind Living apt after d/c from hospital. Based on today's performance, would highly recommend 24/7 supervision/assist if she returns home. Will continue to progress activity as tolerated. Will request mobility team begin working with her as well.     Recommendations for follow up therapy are one component of a multi-disciplinary discharge planning process, led by the attending physician.  Recommendations may be updated based on patient status, additional functional criteria and insurance authorization.  Follow Up Recommendations       Assistance Recommended at Discharge Frequent or constant Supervision/Assistance  Patient can return home with the following A lot of help with walking and/or transfers;A lot of help with bathing/dressing/bathroom;Assistance with cooking/housework;Assist for transportation;Help with stairs or ramp for entrance   Equipment Recommendations  None recommended by PT    Recommendations for Other Services       Precautions / Restrictions Precautions Precautions: Fall Precaution Comments: HOH Restrictions Weight Bearing Restrictions: No     Mobility  Bed Mobility               General bed  mobility comments: oob in recliner    Transfers Overall transfer level: Needs assistance Equipment used: Rolling walker (2 wheels) Transfers: Sit to/from Stand Sit to Stand: Min assist           General transfer comment: x 2-once from recliner, once from bsc. increased time. cues provided.    Ambulation/Gait Ambulation/Gait assistance: Min assist Gait Distance (Feet): 5 Feet (x2) Assistive device: Rolling walker (2 wheels) Gait Pattern/deviations: Step-through pattern, Decreased stride length       General Gait Details: Pt walked across room to bsc to rest, but also for toileting. Assist to steady. Pt fatigued after short distance. Once ready, she was able to walk back to the recliner. RW required.   Stairs             Wheelchair Mobility    Modified Rankin (Stroke Patients Only)       Balance Overall balance assessment: Needs assistance         Standing balance support: Bilateral upper extremity supported, Reliant on assistive device for balance, During functional activity Standing balance-Leahy Scale: Poor                              Cognition Arousal/Alertness: Awake/alert Behavior During Therapy: WFL for tasks assessed/performed Overall Cognitive Status: Within Functional Limits for tasks assessed                                          Exercises      General  Comments        Pertinent Vitals/Pain Pain Assessment Pain Assessment: Faces Faces Pain Scale: Hurts a little bit Pain Location: abdomen Pain Descriptors / Indicators: Discomfort, Sore Pain Intervention(s): Limited activity within patient's tolerance, Monitored during session, Repositioned    Home Living                          Prior Function            PT Goals (current goals can now be found in the care plan section) Progress towards PT goals: Progressing toward goals    Frequency    Min 3X/week      PT Plan Current plan  remains appropriate    Co-evaluation              AM-PAC PT "6 Clicks" Mobility   Outcome Measure  Help needed turning from your back to your side while in a flat bed without using bedrails?: A Lot Help needed moving from lying on your back to sitting on the side of a flat bed without using bedrails?: A Lot Help needed moving to and from a bed to a chair (including a wheelchair)?: A Little Help needed standing up from a chair using your arms (e.g., wheelchair or bedside chair)?: A Little Help needed to walk in hospital room?: A Lot Help needed climbing 3-5 steps with a railing? : Total 6 Click Score: 13    End of Session Equipment Utilized During Treatment: Gait belt Activity Tolerance: Patient limited by fatigue Patient left: in chair;with call bell/phone within reach;with nursing/sitter in room   PT Visit Diagnosis: Unsteadiness on feet (R26.81);Muscle weakness (generalized) (M62.81);Difficulty in walking, not elsewhere classified (R26.2);Pain     Time: [id number] PT Time Calculation (min) (ACUTE ONLY): 20 min  Charges:  $Gait Training: 8-22 mins                        Doreatha Massed, PT Acute Rehabilitation  Office: [phone]

## 2022-11-10 NOTE — Progress Notes (Signed)
PHARMACY - TOTAL PARENTERAL NUTRITION CONSULT NOTE   Indication: Prolonged ileus  Patient Measurements: Height: 5\' 2"  (157.5 cm) Weight: 64.6 kg (142 lb 6.7 oz) IBW/kg (Calculated) : 50.1 TPN AdjBW (KG): 53.7 Body mass index is 26.05 kg/m. Usual Weight:   Assessment: 74 yoF admitted on 3/25 with SBO that failed to improve with conservative management.  She underwent exploratory laparotomy 3/28 with lysis of extensive intra-abdominal adhesions.  Pharmacy is consulted to dose TPN.    Glucose / Insulin: Hx of DM, on Lantus 12 units daily PTA  - Received dexamethasone 4mg  intra-op on 3/28 - on rSSI q6h (used 14 units since TPN rate increased to goal rate of 75 ml/hr at 6p yesterday, est requirement ~28 units un 24 hrs) - CBGs (goal <150): 196-266 - Insulin glargine 5 units given on 3/30 afternoon, transitioned to insulin in TPN bag on 3/31.   Electrolytes:  Mag 1.8, K 3.7; other lytes wnl - Goal for ileus: K > 4, Mag > 2, phos ~3 Renal: SCr, BUN WNL Hepatic: LFTs WNL - Trig:  77 (3/29), 192 (40/1) Intake / Output; MIVF:  - mIVF: NS @ 75 - I/O: +3074 mL - good UOP GI Imaging:  - 3/25 CT abdomen: SBO - 3/30 Xray: Nonobstructive bowel gas pattern. GI Surgeries / Procedures:  - 3/28 ex lap for LOA and SBO   Central access: PICC placed 3/29 TPN start date: 3/29  Nutritional Goals: Goal TPN rate is 75 mL/hr (provides 81 g of protein and 1746 kcals per day)   RD Assessment: Estimated Needs Total Energy Estimated Needs: 1600-1800 Total Protein Estimated Needs: 80 Total Fluid Estimated Needs: >/= 2L  Current Nutrition:  Full liquids and TPN  - NG tube to LIWS, clamping trial on 3/31  Plan:   Now:  - magnesium 1gm IV x1 - potassium chloride 20 meq PO x1   Per Melina Modena (via [place] msg) on 4/1, continue with TPN for now.  At 18:00 - Continue TPN to goal rate, 75 mL/hr at 1800 - Electrolytes in TPN:  Na 32mEq/L K 67mEq/L Ca 25mEq/L Mg 7 mEq/L Phos 20 mmol/L Cl:Ac  1:2 - Add standard MVI and trace elements to TPN - Continue resistant SSI q6h and adjust as needed  - Add Regular Insulin 12 units to TPN bag - Continue 50 mL/hr at 1800 - Monitor TPN labs on Mon/Thurs, and PRN - bmet, mag and phos on 4/2   Dia Sitter, PharmD, BCPS 11/10/2022 7:46 AM

## 2022-11-10 NOTE — Progress Notes (Signed)
   Progress Note  4 Days Post-Op  Subjective: Patient reports some abdominal pain still, per [facility] not taking much pain medication at all. She reports she was tolerating FLD with NGT clamped. No BM.   Objective: Vital signs in last 24 hours: Temp:  [97.8 F (36.6 C)-98.6 F (37 C)] 97.8 F (36.6 C) (04/01 0540) Pulse Rate:  [85-98] 86 (04/01 0540) Resp:  [16-19] 16 (04/01 0540) BP: (138-147)/(55-74) 145/56 (04/01 0540) SpO2:  [96 %-100 %] 96 % (04/01 0910) Last BM Date : 11/03/22  Intake/Output from previous day: 03/31 0701 - 04/01 0700 In: 4124.7 [P.O.:570; I.V.:3299.1; IV Piggyback:255.6] Out: 1050 [Urine:1050] Intake/Output this shift: Total I/O In: 120 [P.O.:120] Out: 400 [Urine:400]  PE: General: pleasant, WD, elderly female who is laying in bed in NAD HEENT: HOH Heart: regular, rate, and rhythm.   Lungs: CTAB, no wheezes, rhonchi, or rales noted.  Respiratory effort nonlabored Abd: soft, appropriately ttp, mild distention, incision appears C/D/I with honeycomb present, NGT removed, BS hypoactive  Psych: A&Ox3 with an appropriate affect.    Lab Results:  Recent Labs    11/08/22 0253 11/10/22 0305  WBC 12.4* 9.3  HGB 12.9 11.0*  HCT 40.6 34.4*  PLT 211 164   BMET Recent Labs    11/09/22 0247 11/10/22 0305  NA 142 138  K 3.6 3.7  CL 113* 104  CO2 24 26  GLUCOSE 198* 196*  BUN 14 13  CREATININE 0.40* 0.53  CALCIUM 8.0* 7.9*   PT/INR No results for input(s): "LABPROT", "INR" in the last 72 hours. CMP     Component Value Date/Time   NA 138 11/10/2022 0305   K 3.7 11/10/2022 0305   CL 104 11/10/2022 0305   CO2 26 11/10/2022 0305   GLUCOSE 196 (H) 11/10/2022 0305   BUN 13 11/10/2022 0305   CREATININE 0.53 11/10/2022 0305   CALCIUM 7.9 (L) 11/10/2022 0305   PROT 4.9 (L) 11/10/2022 0305   ALBUMIN 2.2 (L) 11/10/2022 0305   AST 14 (L) 11/10/2022 0305   ALT 12 11/10/2022 0305   ALKPHOS 57 11/10/2022 0305   BILITOT 0.5 11/10/2022 0305   GFRNONAA  >60 11/10/2022 0305   GFRAA >60 05/15/2020 0300   Lipase     Component Value Date/Time   LIPASE 25 11/03/2022 1423       Studies/Results: No results found.  Anti-infectives: Anti-infectives (From admission, onward)    Start     Dose/Rate Route Frequency Ordered Stop   11/06/22 1345  ceFAZolin (ANCEF) IVPB 2g/100 mL premix        2 g 200 mL/hr over 30 Minutes Intravenous  Once 11/06/22 1335 11/06/22 1610        Assessment/Plan SBO POD4 s/p ex-lap with LOA 3/28 Dr. Kae Heller - tolerating NGT clamp and FLD - removed NGT at bedside - continue FLD and TPN today  - mobilize as able  - when having more reliable bowel function advance to soft diet and start to wean TPN  FEN: FLD, TPN, IVF @50  cc/h VTE: start LMWH ID: ancef pre-op  LOS: 7 days     Norm Parcel, [facility] Surgery 11/10/2022, 10:49 AM Please see Amion for pager number during day hours 7:00am-4:30pm

## 2022-11-10 NOTE — Progress Notes (Signed)
Nutrition Follow-up  INTERVENTION:   -TPN management per Pharmacy  -Glucerna Shake po TID, each supplement provides 220 kcal and 10 grams of protein   -Needs updated weight for admission  NUTRITION DIAGNOSIS:   Inadequate oral intake related to other (see comment), altered GI function as evidenced by NPO status.  Now on full liquids  GOAL:   Patient will meet greater than or equal to 90% of their needs  Meeting with TPN  MONITOR:   Weight trends, I & O's, Labs, Diet advancement, PO intakes  ASSESSMENT:   89 y.o. female with PMHx including HTN, asthma, GERD, DM2, HLD and osteoporosis who was admitted for a SBO after experiencing abdominal pain and nausea  3/25: admitted, NGT placed 3/28: s/p ex lap, LOA 3/29: TPN initiated 4/1: NGT removed, FLD started  Patient now on full liquids. Will order Glucerna shakes.  TPN to continue today at 75 ml/hr, providing 1746 kcals and 81g protein. Per surgery to continue until having consistent BMs.   Admission weight: 130 lbs Current weight: 142 lbs  Medications: Pepcid, Lasix, KLOR-CON, IV Mg sulfate  Labs reviewed: CBGs: 175-266  TG: 192  Diet Order:   Diet Order             Diet full liquid Room service appropriate? Yes; Fluid consistency: Thin  Diet effective now                   EDUCATION NEEDS:   Not appropriate for education at this time  Skin:  Skin Assessment: Skin Integrity Issues: Skin Integrity Issues:: Incisions Incisions: 3/28 abdomen  Last BM:  3/27 type 5  Height:   Ht Readings from Last 1 Encounters:  11/03/22 5\' 2"  (1.575 m)    Weight:   Wt Readings from Last 1 Encounters:  11/03/22 64.6 kg    BMI:  Body mass index is 26.05 kg/m.  Estimated Nutritional Needs:   Kcal:  1600-1800  Protein:  80-95g  Fluid:  >/= 2L   Clayton Bibles, MS, RD, LDN Inpatient Clinical Dietitian Contact information available via Amion

## 2022-11-10 NOTE — Progress Notes (Signed)
Chaplain engaged in an initial visit with Rhonda Watkins. Almendra shared that her husband passed in October at [facility] after being there for two months. She has been through some significant transitions in experiencing his loss and then having to move shortly after, and going through her own health crises. Marqueta worked to hold back her tears and Chaplain affirmed her need to cry and release. Keiasia talked about the ways her home is full of boxes which also feels like her life right now. Chaplain affirmed her need to process and grieve, and offered to be support.  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 Chaplain is going to work towards getting a Idelle Crouch to come see Brad as she desires confession. Forrestine has five children who are spread out geographically. She does have one son who is able to be active and present through her hospitalization.   Chaplain offered reflective listening, a compassionate presence, normalization of her emotions and grief, and interventions for support.   Chaplain Jannis Atkins, MDiv  11/10/22 1100  Spiritual Encounters  Type of Visit Initial  Care provided to: Patient  Referral source Nurse (RN/NT/LPN)  Reason for visit Routine spiritual support  Spiritual Framework  Presenting Themes Community and relationships;Coping tools;Impactful experiences and emotions;Significant life change  Needs/Challenges/Barriers Grief/Loss  Patient Stress Factors Major life changes;Family relationships;Loss  Interventions  Spiritual Care Interventions Made Compassionate presence;Established relationship of care and support;Normalization of emotions;Reflective listening;Narrative/life review;Encouragement;Supported grief process  Intervention Outcomes  Outcomes Awareness of support;Connected to spiritual community;Connection to spiritual care;Awareness around self/spiritual resourses;Reduced anxiety;Reduced isolation

## 2022-11-10 NOTE — Progress Notes (Signed)
  Progress Note   Patient: AKYRAH CHRISTEN [id number] DOB: 1932/03/17 DOA: 11/03/2022     7 DOS: the patient was seen and examined on 11/10/2022   Brief hospital course: Mrs. Matlock is an 89 y.o. F with HTN, DM, hx asthma who presented with colicky abdominal pain, CT showed SBO.  Failed to improve with conservative management.  Underwent exploratory laparotomy 3/28.  Consultants General surgery  Procedures Exploratory laparotomy, lysis of adhesions x 90 minutes   Assessment and Plan: * SBO (small bowel obstruction) ([place]) Failed to improve with conservative management,status post exploratory laparotomy 3/28 Continue management per general surgery.  Continue TPN, now on liquid diet. Seems to be improving daily.   Type 2 diabetes mellitus with other specified complication (HCC) CBG remains elevated.  Continue insulin with TPN. Continue sliding scale insulin.   Hypercholesterolemia Resume atorvastatin when able to take p.o. consistently.   Essential hypertension Blood sugars better.  Monitor. Holding furosemide, irbesartan, diltiazem, resume when able to take p.o.   Hypophosphatemia Replete per pharmacy      Subjective:  Feels ok  Physical Exam: Vitals:   11/10/22 0540 11/10/22 0910 11/10/22 1308 11/10/22 1359  BP: (!) 145/56  (!) 133/48   Pulse: 86  83   Resp: 16  17   Temp: 97.8 F (36.6 C)  98.2 F (36.8 C)   TempSrc: Oral  Oral   SpO2: 98% 96% 98%   Weight:    66.6 kg  Height:       Physical Exam Vitals reviewed.  Constitutional:      General: She is not in acute distress.    Appearance: She is not ill-appearing or toxic-appearing.     Comments: Sitting in chair  Cardiovascular:     Rate and Rhythm: Normal rate and regular rhythm.     Heart sounds: No murmur heard. Pulmonary:     Effort: Pulmonary effort is normal. No respiratory distress.     Breath sounds: No wheezing, rhonchi or rales.  Musculoskeletal:     Right lower leg: No edema.     Left  lower leg: No edema.  Neurological:     Mental Status: She is alert.  Psychiatric:        Mood and Affect: Mood normal.        Behavior: Behavior normal.     Data Reviewed: I/O not accurate No sign of volume overload on exam  Family Communication:   Disposition: Status is: Inpatient Remains inpatient appropriate because: s/p ex lap, awaiting return of bowel function  Planned Discharge Destination: Home health    Time spent: 20 minutes  Author: Murray Hodgkins, MD 11/10/2022 3:24 PM  For on call review [email or web address].

## 2022-11-11 ENCOUNTER — Inpatient Hospital Stay (HOSPITAL_COMMUNITY): Payer: Medicare Other

## 2022-11-11 DIAGNOSIS — K56609 Unspecified intestinal obstruction, unspecified as to partial versus complete obstruction: Secondary | ICD-10-CM | POA: Diagnosis not present

## 2022-11-11 DIAGNOSIS — Z794 Long term (current) use of insulin: Secondary | ICD-10-CM | POA: Diagnosis not present

## 2022-11-11 DIAGNOSIS — E1169 Type 2 diabetes mellitus with other specified complication: Secondary | ICD-10-CM | POA: Diagnosis not present

## 2022-11-11 LAB — BASIC METABOLIC PANEL
Anion gap: 6 (ref 5–15)
BUN: 15 mg/dL (ref 8–23)
CO2: 29 mmol/L (ref 22–32)
Calcium: 8.4 mg/dL — ABNORMAL LOW (ref 8.9–10.3)
Chloride: 102 mmol/L (ref 98–111)
Creatinine, Ser: 0.51 mg/dL (ref 0.44–1.00)
GFR, Estimated: >60 mL/min (ref >60)
Glucose, Bld: 153 mg/dL — ABNORMAL HIGH (ref 70–99)
Potassium: 4.3 mmol/L (ref 3.5–5.1)
Sodium: 137 mmol/L (ref 135–145)

## 2022-11-11 LAB — PHOSPHORUS: Phosphorus: 3.6 mg/dL (ref 2.5–4.6)

## 2022-11-11 LAB — GLUCOSE, CAPILLARY
Glucose-Capillary: 155 mg/dL — ABNORMAL HIGH (ref 70–99)
Glucose-Capillary: 180 mg/dL — ABNORMAL HIGH (ref 70–99)
Glucose-Capillary: 185 mg/dL — ABNORMAL HIGH (ref 70–99)
Glucose-Capillary: 208 mg/dL — ABNORMAL HIGH (ref 70–99)

## 2022-11-11 LAB — MAGNESIUM: Magnesium: 2.3 mg/dL (ref 1.7–2.4)

## 2022-11-11 MED ORDER — BISACODYL 10 MG RE SUPP
10.0000 mg | Freq: Once | RECTAL | Status: AC
  Administered 2022-11-11: 10 mg via RECTAL
  Filled 2022-11-11: qty 1

## 2022-11-11 MED ORDER — ALUM & MAG HYDROXIDE-SIMETH 200-200-20 MG/5ML PO SUSP
30.0000 mL | ORAL | Status: DC | PRN
  Administered 2022-11-11 (×2): 30 mL via ORAL
  Filled 2022-11-11 (×2): qty 30

## 2022-11-11 MED ORDER — TRAVASOL 10 % IV SOLN
INTRAVENOUS | Status: DC
  Filled 2022-11-11: qty 432

## 2022-11-11 MED ORDER — ORAL CARE MOUTH RINSE: 15.0000 mL | OROMUCOSAL | Status: DC | PRN

## 2022-11-11 MED ORDER — POLYETHYLENE GLYCOL 3350 17 G PO PACK
17.0000 g | PACK | Freq: Three times a day (TID) | ORAL | Status: DC
  Administered 2022-11-11 (×3): 17 g via ORAL
  Filled 2022-11-11 (×3): qty 1

## 2022-11-11 NOTE — Progress Notes (Signed)
Physical Therapy Treatment Patient Details Name: Rhonda Watkins MRN: [id number] DOB: [birth year] Today's Date: 11/11/2022   History of Present Illness 89 yo female admitted to hospital on 11/03/2022 presents to therapy secondary to weakness and functional decline attributed to N and V with episodes of abdominal pain and severe constipation. Pt has PMH of several abdominal sx, DM II, DOE, HTN, acute gum infection and on ABX, recurrent UTI, back sx and B knee arthroscopy. Pt has current dx of SBO with NG tube placement 3/25.    PT Comments    Pt up with NT. She was agreeable to mobilizing with therapy. She walked ~20 feet with a RW. Ambulation distance was limited by general weakness and fatigue. Pt continues to report that son will "take care of things." Son arrived end of session-discussed d/c planning with both pt and son-encouraged him to begin thinking about what may be needed as far as arrangements for in home assistance. Pt lives alone in ind living and I have some concerns about her being able to manage at home alone.     Recommendations for follow up therapy are one component of a multi-disciplinary discharge planning process, led by the attending physician.  Recommendations may be updated based on patient status, additional functional criteria and insurance authorization.  Follow Up Recommendations       Assistance Recommended at Discharge Frequent or constant Supervision/Assistance  Patient can return home with the following A lot of help with walking and/or transfers;A lot of help with bathing/dressing/bathroom;Assistance with cooking/housework;Assist for transportation;Help with stairs or ramp for entrance   Equipment Recommendations  None recommended by PT    Recommendations for Other Services       Precautions / Restrictions Precautions Precautions: Fall Precaution Comments: HOH Restrictions Weight Bearing Restrictions: No     Mobility  Bed Mobility Overal bed mobility:  Needs Assistance Bed Mobility: Sit to Supine       Sit to supine: Min assist, HOB elevated   General bed mobility comments: Assist for LEs. Increased time.    Transfers Overall transfer level: Needs assistance Equipment used: Rolling walker (2 wheels) Transfers: Sit to/from Stand Sit to Stand: Min guard           General transfer comment: Min guard A. Cues for safety, technique, hand placement    Ambulation/Gait Ambulation/Gait assistance: Min guard Gait Distance (Feet): 20 Feet Assistive device: Rolling walker (2 wheels) Gait Pattern/deviations: Step-through pattern, Decreased stride length       General Gait Details: Min guard A for mobility. Very slow, effortful gait due to weakness, fatigue-limited distance   Stairs             Wheelchair Mobility    Modified Rankin (Stroke Patients Only)       Balance Overall balance assessment: Needs assistance         Standing balance support: Bilateral upper extremity supported, Reliant on assistive device for balance, During functional activity Standing balance-Leahy Scale: Poor                              Cognition Arousal/Alertness: Awake/alert Behavior During Therapy: WFL for tasks assessed/performed Overall Cognitive Status: Within Functional Limits for tasks assessed                                          Exercises  General Comments        Pertinent Vitals/Pain Pain Assessment Pain Assessment: Faces Faces Pain Scale: Hurts a little bit Pain Location: abdomen Pain Descriptors / Indicators: Discomfort, Sore Pain Intervention(s): Monitored during session, Repositioned    Home Living                          Prior Function            PT Goals (current goals can now be found in the care plan section) Progress towards PT goals: Progressing toward goals    Frequency    Min 3X/week      PT Plan Current plan remains appropriate     Co-evaluation              AM-PAC PT "6 Clicks" Mobility   Outcome Measure  Help needed turning from your back to your side while in a flat bed without using bedrails?: A Little Help needed moving from lying on your back to sitting on the side of a flat bed without using bedrails?: A Little Help needed moving to and from a bed to a chair (including a wheelchair)?: A Little Help needed standing up from a chair using your arms (e.g., wheelchair or bedside chair)?: A Little Help needed to walk in hospital room?: A Lot Help needed climbing 3-5 steps with a railing? : Total 6 Click Score: 15    End of Session Equipment Utilized During Treatment: Gait belt Activity Tolerance: Patient limited by fatigue Patient left: in bed;with call bell/phone within reach;with family/visitor present;with bed alarm set   PT Visit Diagnosis: Unsteadiness on feet (R26.81);Muscle weakness (generalized) (M62.81);Difficulty in walking, not elsewhere classified (R26.2);Pain     Time: [id number] PT Time Calculation (min) (ACUTE ONLY): 15 min  Charges:  $Gait Training: 8-22 mins                        Doreatha Massed, PT Acute Rehabilitation  Office: [phone] '

## 2022-11-11 NOTE — Progress Notes (Signed)
Physical Therapy Treatment Patient Details Name: Rhonda Watkins MRN: [id number] DOB: January 10, 1932 Today's Date: 11/11/2022   History of Present Illness 89 yo female admitted to hospital on 11/03/2022 presents to therapy secondary to weakness and functional decline attributed to N and V with episodes of abdominal pain and severe constipation. Pt has PMH of several abdominal sx, DM II, DOE, HTN, acute gum infection and on ABX, recurrent UTI, back sx and B knee arthroscopy. Pt has current dx of SBO with NG tube placement 3/25.    PT Comments    Pt agreeable to ambulating again. She was able to increase the distance but was limited by weakness, fatigue, and some abd pain.    Recommendations for follow up therapy are one component of a multi-disciplinary discharge planning process, led by the attending physician.  Recommendations may be updated based on patient status, additional functional criteria and insurance authorization.  Follow Up Recommendations       Assistance Recommended at Discharge Frequent or constant Supervision/Assistance  Patient can return home with the following A lot of help with walking and/or transfers;A lot of help with bathing/dressing/bathroom;Assistance with cooking/housework;Assist for transportation;Help with stairs or ramp for entrance   Equipment Recommendations  None recommended by PT    Recommendations for Other Services       Precautions / Restrictions Precautions Precautions: Fall Precaution Comments: HOH Restrictions Weight Bearing Restrictions: No     Mobility  Bed Mobility Overal bed mobility: Needs Assistance Bed Mobility: Supine to Sit, Sit to Supine     Supine to sit: Min guard, HOB elevated Sit to supine: Min guard, HOB elevated   General bed mobility comments: Assist for LEs. Increased time. Pt relies on bedrail.    Transfers Overall transfer level: Needs assistance Equipment used: Rolling walker (2 wheels) Transfers: Sit to/from  Stand Sit to Stand: Min guard           General transfer comment: Min guard A. Cues for safety, hand placement. Increased time.    Ambulation/Gait Ambulation/Gait assistance: Min guard Gait Distance (Feet): 35 Feet Assistive device: Rolling walker (2 wheels) Gait Pattern/deviations: Step-through pattern, Decreased stride length       General Gait Details: Min guard A for mobility. Very slow, effortful gait due to weakness, fatigue-limited distance for this reason.   Stairs             Wheelchair Mobility    Modified Rankin (Stroke Patients Only)       Balance Overall balance assessment: Needs assistance         Standing balance support: Bilateral upper extremity supported, Reliant on assistive device for balance, During functional activity Standing balance-Leahy Scale: Poor                              Cognition Arousal/Alertness: Awake/alert Behavior During Therapy: WFL for tasks assessed/performed Overall Cognitive Status: Within Functional Limits for tasks assessed                                          Exercises      General Comments        Pertinent Vitals/Pain Pain Assessment Pain Assessment: Faces Faces Pain Scale: Hurts little more Pain Location: abdomen Pain Descriptors / Indicators: Discomfort, Sore Pain Intervention(s): Monitored during session    Home Living  Prior Function            PT Goals (current goals can now be found in the care plan section) Progress towards PT goals: Progressing toward goals    Frequency    Min 3X/week      PT Plan Current plan remains appropriate    Co-evaluation              AM-PAC PT "6 Clicks" Mobility   Outcome Measure  Help needed turning from your back to your side while in a flat bed without using bedrails?: A Little Help needed moving from lying on your back to sitting on the side of a flat bed without  using bedrails?: A Little Help needed moving to and from a bed to a chair (including a wheelchair)?: A Little Help needed standing up from a chair using your arms (e.g., wheelchair or bedside chair)?: A Little Help needed to walk in hospital room?: A Lot Help needed climbing 3-5 steps with a railing? : Total 6 Click Score: 15    End of Session Equipment Utilized During Treatment: Gait belt Activity Tolerance: Patient limited by fatigue Patient left: in bed;with call bell/phone within reach;with bed alarm set   PT Visit Diagnosis: Unsteadiness on feet (R26.81);Muscle weakness (generalized) (M62.81);Difficulty in walking, not elsewhere classified (R26.2);Pain     Time: [id number] PT Time Calculation (min) (ACUTE ONLY): 15 min  Charges:  $Gait Training: 8-22 mins                         Doreatha Massed, PT Acute Rehabilitation  Office: [phone]

## 2022-11-11 NOTE — Progress Notes (Signed)
   Progress Note  5 Days Post-Op  Subjective: Tolerating FLD. Per RN ate more yesterday but patient tells me she can't remember. She does report heartburn and just took maalox. Reports flatus but denies BM. Mild intermittent nausea - 1-2x daily but denies vomiting since surgery.  Objective: Vital signs in last 24 hours: Temp:  [97.8 F (36.6 C)-98.3 F (36.8 C)] 97.8 F (36.6 C) (04/02 0517) Pulse Rate:  [70-83] 70 (04/02 0517) Resp:  [17-18] 18 (04/02 0517) BP: (128-133)/(48-61) 128/54 (04/02 0517) SpO2:  [95 %-98 %] 95 % (04/02 0517) Weight:  [66.6 kg] 66.6 kg (04/01 1359) Last BM Date : 11/03/22  Intake/Output from previous day: 04/01 0701 - 04/02 0700 In: 2986.5 [P.O.:1300; I.V.:1586.5; IV Piggyback:100] Out: 1850 [Urine:1850] Intake/Output this shift: Total I/O In: 120 [P.O.:120] Out: 450 [Urine:450]  PE: General: pleasant, WD, elderly female who is laying in bed in NAD HEENT: HOH Heart: regular, rate, and rhythm.   Lungs: CTAB, no wheezes, rhonchi, or rales noted.  Respiratory effort nonlabored Abd: soft, appropriately ttp, mild distention, incision appears C/D/I with honeycomb present,  BS hypoactive  Psych: A&Ox3 with an appropriate affect.    Lab Results:  Recent Labs    11/10/22 0305  WBC 9.3  HGB 11.0*  HCT 34.4*  PLT 164   BMET Recent Labs    11/10/22 0305 11/11/22 0415  NA 138 137  K 3.7 4.3  CL 104 102  CO2 26 29  GLUCOSE 196* 153*  BUN 13 15  CREATININE 0.53 0.51  CALCIUM 7.9* 8.4*   PT/INR No results for input(s): "LABPROT", "INR" in the last 72 hours. CMP     Component Value Date/Time   NA 137 11/11/2022 0415   K 4.3 11/11/2022 0415   CL 102 11/11/2022 0415   CO2 29 11/11/2022 0415   GLUCOSE 153 (H) 11/11/2022 0415   BUN 15 11/11/2022 0415   CREATININE 0.51 11/11/2022 0415   CALCIUM 8.4 (L) 11/11/2022 0415   PROT 4.9 (L) 11/10/2022 0305   ALBUMIN 2.2 (L) 11/10/2022 0305   AST 14 (L) 11/10/2022 0305   ALT 12 11/10/2022 0305    ALKPHOS 57 11/10/2022 0305   BILITOT 0.5 11/10/2022 0305   GFRNONAA >60 11/11/2022 0415   GFRAA >60 05/15/2020 0300   Lipase     Component Value Date/Time   LIPASE 25 11/03/2022 1423       Studies/Results: No results found.  Anti-infectives: Anti-infectives (From admission, onward)    Start     Dose/Rate Route Frequency Ordered Stop   11/06/22 1345  ceFAZolin (ANCEF) IVPB 2g/100 mL premix        2 g 200 mL/hr over 30 Minutes Intravenous  Once 11/06/22 1335 11/06/22 1610        Assessment/Plan SBO POD5 s/p ex-lap with LOA 3/28 Dr. Kae Heller - continue FLD and decrease TPN today - post-operative ileus vs constipation. Will check KUB today. - increase miralax and give suppository again today. - mobilize as able    FEN: FLD, ensure, decrease TPN support today VTE: start LMWH ID: ancef pre-op  LOS: 8 days     Jill Alexanders, [facility] Surgery 11/11/2022, 8:16 AM Please see Amion for pager number during day hours 7:00am-4:30pm

## 2022-11-11 NOTE — Progress Notes (Signed)
PHARMACY - TOTAL PARENTERAL NUTRITION CONSULT NOTE   Indication: Prolonged ileus  Patient Measurements: Height: 5\' 2"  (157.5 cm) Weight: 66.6 kg (146 lb 13.2 oz) IBW/kg (Calculated) : 50.1 TPN AdjBW (KG): 53.7 Body mass index is 26.85 kg/m. Usual Weight:   Assessment: 71 yoF admitted on 3/25 with SBO that failed to improve with conservative management.  She underwent exploratory laparotomy 3/28 with lysis of extensive intra-abdominal adhesions.  Pharmacy is consulted to dose TPN.    Glucose / Insulin: Hx of DM, on Lantus 12 units daily PTA  - Received dexamethasone 4mg  intra-op on 3/28 - on rSSI q6h (used 19 units in the past 24 hrs) - CBGs (goal <150): improved (153-230 - only one reading in the 200s) - Insulin glargine 5 units given on 3/30 afternoon, transitioned to insulin in TPN bag on 3/31.   Electrolytes:  Na down 137; other lytes wnl - Goal for ileus: K > 4, Mag > 2, phos ~3 Renal: SCr, BUN WNL Hepatic: LFTs WNL - Trig:  77 (3/29), 192 (40/1) Intake / Output; MIVF:  - mIVF: NS @ 50 - I/O: +1136 mL - good UOP - last BM on 11/05/22 GI Imaging:  - 3/25 CT abdomen: SBO - 3/30 Xray: Nonobstructive bowel gas pattern. GI Surgeries / Procedures:  - 3/28 ex lap for LOA and SBO   Central access: PICC placed 3/29 TPN start date: 3/29  Nutritional Goals: Goal TPN rate is 75 mL/hr (provides 81 g of protein and 1746 kcals per day)   RD Assessment: Estimated Needs Total Energy Estimated Needs: 1600-1800 Total Protein Estimated Needs: 80-95g Total Fluid Estimated Needs: >/= 2L  Current Nutrition:  Full liquids and TPN  - 4/31: clamped NGT - 4/1: NG tubed removed; ensure 237 mL TID ordered   Plan:   Per CCS, ok to reduce TPN rate today  At 18:00 - Reduce TPN rate to 40 mL/hr at 1800 - Electrolytes in TPN:  Na 66mEq/L K 34mEq/L Ca 23mEq/L Mg 7 mEq/L Phos 20 mmol/L Cl:Ac 1:2 - Add standard MVI and trace elements to TPN - Continue resistant SSI q6h and adjust as  needed  - Continue insulin 10 units in TPN bag - Continue 50 mL/hr at 1800 - Monitor TPN labs on Mon/Thurs, and PRN   Dia Sitter, PharmD, BCPS 11/11/2022 7:08 AM

## 2022-11-11 NOTE — Progress Notes (Signed)
  Progress Note   Patient: Rhonda Watkins [id number] DOB: 29-Oct-1931 DOA: 11/03/2022     8 DOS: the patient was seen and examined on 11/11/2022   Brief hospital course: Rhonda Watkins is an 89 y.o. F with HTN, DM, hx asthma who presented with colicky abdominal pain, CT showed SBO.  Failed to improve with conservative management.  Underwent exploratory laparotomy 3/28.  Now weaning TPN.  Condition slowly improving.  Home versus SNF when tolerating diet and bowel function has returned.  Consultants General surgery  Procedures Exploratory laparotomy, lysis of adhesions x 90 minutes   Assessment and Plan: * SBO (small bowel obstruction) ([place]) Failed to improve with conservative management,status post exploratory laparotomy 3/28 Continue management per general surgery.  Weaning TPN, continue on liquid diet. Improving daily.   Type 2 diabetes mellitus with other specified complication (HCC) CBG remains elevated.  Continue insulin with TPN. Continue sliding scale insulin.   Hypercholesterolemia Resume atorvastatin when able to take p.o. consistently.   Essential hypertension Blood sugar stable. Holding furosemide, irbesartan, diltiazem.  Can resume as needed.  Currently blood pressure stable.   Hypophosphatemia Resolved.      Subjective:  Some pain at times Tolerating diet  Physical Exam: Vitals:   11/10/22 2120 11/11/22 0517 11/11/22 0849 11/11/22 1322  BP: 128/61 (!) 128/54  (!) 138/93  Pulse: 74 70  88  Resp: 18 18    Temp: 98.3 F (36.8 C) 97.8 F (36.6 C)  98 F (36.7 C)  TempSrc: Oral Oral  Oral  SpO2: 97% 95% 97% 99%  Weight:      Height:       Physical Exam Vitals reviewed.  Constitutional:      General: She is not in acute distress.    Appearance: She is not ill-appearing or toxic-appearing.     Comments: Appears generally better today  Cardiovascular:     Rate and Rhythm: Normal rate and regular rhythm.     Heart sounds: No murmur heard. Pulmonary:      Effort: Pulmonary effort is normal. No respiratory distress.     Breath sounds: No wheezing, rhonchi or rales.  Neurological:     Mental Status: She is alert.  Psychiatric:        Mood and Affect: Mood normal.        Behavior: Behavior normal.   Data Reviewed: CBG stable BMP noted  Family Communication: son at bedside  Disposition: Status is: Inpatient Remains inpatient appropriate because: on TPN  Planned Discharge Destination: Home with Home Health    Time spent: 20 minutes  Author: Murray Hodgkins, MD 11/11/2022 6:24 PM  For on call review [email or web address].

## 2022-11-12 ENCOUNTER — Telehealth: Payer: Self-pay | Admitting: *Deleted

## 2022-11-12 ENCOUNTER — Other Ambulatory Visit: Payer: Self-pay | Admitting: Internal Medicine

## 2022-11-12 DIAGNOSIS — K56609 Unspecified intestinal obstruction, unspecified as to partial versus complete obstruction: Secondary | ICD-10-CM | POA: Diagnosis not present

## 2022-11-12 LAB — GLUCOSE, CAPILLARY
Glucose-Capillary: 178 mg/dL — ABNORMAL HIGH (ref 70–99)
Glucose-Capillary: 160 mg/dL — ABNORMAL HIGH (ref 70–99)
Glucose-Capillary: 127 mg/dL — ABNORMAL HIGH (ref 70–99)
Glucose-Capillary: 166 mg/dL — ABNORMAL HIGH (ref 70–99)

## 2022-11-12 MED ORDER — ARNUITY ELLIPTA 100 MCG/ACT IN AEPB: 1.0000 | INHALATION_SPRAY | Freq: Every day | RESPIRATORY_TRACT | 5 refills | Status: DC

## 2022-11-12 MED ORDER — PANTOPRAZOLE SODIUM 40 MG PO TBEC
40.0000 mg | DELAYED_RELEASE_TABLET | Freq: Every day | ORAL | Status: DC
  Administered 2022-11-12: 40 mg via ORAL
  Filled 2022-11-12: qty 1

## 2022-11-12 MED ORDER — INSULIN ASPART 100 UNIT/ML IJ SOLN: 0.0000 [IU] | Freq: Every day | INTRAMUSCULAR | Status: DC

## 2022-11-12 MED ORDER — POLYETHYLENE GLYCOL 3350 17 G PO PACK
17.0000 g | PACK | Freq: Two times a day (BID) | ORAL | Status: DC
  Administered 2022-11-12 – 2022-11-13 (×3): 17 g via ORAL
  Filled 2022-11-12 (×3): qty 1

## 2022-11-12 MED ORDER — INSULIN ASPART 100 UNIT/ML IJ SOLN
0.0000 [IU] | Freq: Three times a day (TID) | INTRAMUSCULAR | Status: DC
  Administered 2022-11-12: 2 [IU] via SUBCUTANEOUS
  Administered 2022-11-12 – 2022-11-13 (×2): 3 [IU] via SUBCUTANEOUS

## 2022-11-12 MED ORDER — INSULIN ASPART 100 UNIT/ML IJ SOLN: 0.0000 [IU] | Freq: Three times a day (TID) | INTRAMUSCULAR | Status: DC

## 2022-11-12 NOTE — Progress Notes (Signed)
Physical Therapy Treatment Patient Details Name: Rhonda Watkins MRN: [id number] DOB: 12/07/1931 Today's Date: 11/12/2022   History of Present Illness 89 yo female admitted to hospital on 11/03/2022 presents to therapy secondary to weakness and functional decline attributed to N and V with episodes of abdominal pain and severe constipation. Pt has PMH of several abdominal sx, DM II, DOE, HTN, acute gum infection and on ABX, recurrent UTI, back sx and B knee arthroscopy. Pt has current dx of SBO with NG tube placement 3/25.    PT Comments    Progressing slowly. Continued discussion with patient about d/c plan-she tends to defer to her son. There is some concern about her managing at home alone-she is from [facility]. If pt returns home, recommend increased supervision/assist. Recommend TOC consult. Pt will benefit from post acute rehab in the appropriate setting.     Recommendations for follow up therapy are one component of a multi-disciplinary discharge planning process, led by the attending physician.  Recommendations may be updated based on patient status, additional functional criteria and insurance authorization.  Follow Up Recommendations       Assistance Recommended at Discharge Frequent or constant Supervision/Assistance  Patient can return home with the following A lot of help with walking and/or transfers;A lot of help with bathing/dressing/bathroom;Assistance with cooking/housework;Assist for transportation;Help with stairs or ramp for entrance   Equipment Recommendations  None recommended by PT    Recommendations for Other Services       Precautions / Restrictions Precautions Precautions: Fall Precaution Comments: HOH Restrictions Weight Bearing Restrictions: No     Mobility  Bed Mobility Overal bed mobility: Needs Assistance       Supine to sit: Modified independent (Device/Increase time), HOB elevated     General bed mobility comments: Increased time. Pt relies  on bedrail    Transfers Overall transfer level: Needs assistance Equipment used: Rolling walker (2 wheels) Transfers: Sit to/from Stand Sit to Stand: Min guard           General transfer comment: Min guard A. Cues for safety, hand placement. Increased time.    Ambulation/Gait Ambulation/Gait assistance: Min guard Gait Distance (Feet): 45 Feet (x2) Assistive device: Rolling walker (2 wheels) Gait Pattern/deviations: Step-through pattern, Decreased stride length       General Gait Details: Min guard A for mobility. Very slow, effortful gait due to weakness, fatigue. Seated rest break after ~45 feet. One instance of instability where pt released grasp on walker to grab hallway handrail. Cues for safety.   Stairs             Wheelchair Mobility    Modified Rankin (Stroke Patients Only)       Balance Overall balance assessment: Needs assistance         Standing balance support: Bilateral upper extremity supported, Reliant on assistive device for balance, During functional activity Standing balance-Leahy Scale: Poor                              Cognition Arousal/Alertness: Awake/alert Behavior During Therapy: WFL for tasks assessed/performed Overall Cognitive Status: Within Functional Limits for tasks assessed                                          Exercises      General Comments  Pertinent Vitals/Pain Pain Assessment Pain Assessment: Faces Faces Pain Scale: Hurts a little bit Pain Location: abdomen Pain Descriptors / Indicators: Discomfort, Sore Pain Intervention(s): Monitored during session    Home Living                          Prior Function            PT Goals (current goals can now be found in the care plan section) Progress towards PT goals: Progressing toward goals    Frequency           PT Plan Current plan remains appropriate    Co-evaluation              AM-PAC  PT "6 Clicks" Mobility   Outcome Measure  Help needed turning from your back to your side while in a flat bed without using bedrails?: A Little Help needed moving from lying on your back to sitting on the side of a flat bed without using bedrails?: A Little Help needed moving to and from a bed to a chair (including a wheelchair)?: A Little Help needed standing up from a chair using your arms (e.g., wheelchair or bedside chair)?: A Little Help needed to walk in hospital room?: A Little Help needed climbing 3-5 steps with a railing? : A Lot 6 Click Score: 17    End of Session Equipment Utilized During Treatment: Gait belt Activity Tolerance: Patient tolerated treatment well;Patient limited by fatigue Patient left: in chair;with call bell/phone within reach   PT Visit Diagnosis: Unsteadiness on feet (R26.81);Muscle weakness (generalized) (M62.81);Difficulty in walking, not elsewhere classified (R26.2);Pain     Time: [id number] PT Time Calculation (min) (ACUTE ONLY): 21 min  Charges:  $Gait Training: 8-22 mins                         Doreatha Massed, PT Acute Rehabilitation  Office: [phone]

## 2022-11-12 NOTE — Telephone Encounter (Signed)
Called and let a voicemail asking for patient to return call to inform of change in inhaler due to insurance coverage.

## 2022-11-12 NOTE — Progress Notes (Signed)
Progress Note  6 Days Post-Op  Subjective: Tolerating FLD. Per RN ate more yesterday but patient tells me she can't remember. She does report heartburn and just took maalox. Reports flatus but denies BM. Mild intermittent nausea - 1-2x daily but denies vomiting since surgery.  Objective: Vital signs in last 24 hours: Temp:  [98 F (36.7 C)-98.5 F (36.9 C)] 98.5 F (36.9 C) (04/03 0555) Pulse Rate:  [71-88] 71 (04/03 0555) Resp:  [18] 18 (04/03 0555) BP: (122-138)/(56-107) 135/56 (04/03 0555) SpO2:  [96 %-99 %] 99 % (04/03 0825) Last BM Date : 11/03/22  Intake/Output from previous day: 04/02 0701 - 04/03 0700 In: 3409.3 [P.O.:1820; I.V.:1589.3] Out: 2550 [Urine:2550] Intake/Output this shift: No intake/output data recorded.  PE: General: pleasant, WD, elderly female who is laying in bed in NAD HEENT: HOH Heart: regular, rate, and rhythm.   Lungs: CTAB, no wheezes, rhonchi, or rales noted.  Respiratory effort nonlabored Abd: soft, appropriately ttp, mild distention, incision appears C/D/I with honeycomb present,  BS hypoactive  Psych: A&Ox3 with an appropriate affect.    Lab Results:  Recent Labs    11/10/22 0305  WBC 9.3  HGB 11.0*  HCT 34.4*  PLT 164   BMET Recent Labs    11/10/22 0305 11/11/22 0415  NA 138 137  K 3.7 4.3  CL 104 102  CO2 26 29  GLUCOSE 196* 153*  BUN 13 15  CREATININE 0.53 0.51  CALCIUM 7.9* 8.4*   PT/INR No results for input(s): "LABPROT", "INR" in the last 72 hours. CMP     Component Value Date/Time   NA 137 11/11/2022 0415   K 4.3 11/11/2022 0415   CL 102 11/11/2022 0415   CO2 29 11/11/2022 0415   GLUCOSE 153 (H) 11/11/2022 0415   BUN 15 11/11/2022 0415   CREATININE 0.51 11/11/2022 0415   CALCIUM 8.4 (L) 11/11/2022 0415   PROT 4.9 (L) 11/10/2022 0305   ALBUMIN 2.2 (L) 11/10/2022 0305   AST 14 (L) 11/10/2022 0305   ALT 12 11/10/2022 0305   ALKPHOS 57 11/10/2022 0305   BILITOT 0.5 11/10/2022 0305   GFRNONAA >60  11/11/2022 0415   GFRAA >60 05/15/2020 0300   Lipase     Component Value Date/Time   LIPASE 25 11/03/2022 1423       Studies/Results: DG Abd Portable 1V  Result Date: 11/11/2022 CLINICAL DATA:  Ileus. EXAM: PORTABLE ABDOMEN - 1 VIEW COMPARISON:  11/08/2022. FINDINGS: Interval removal of the enteric tube. The bowel gas pattern is normal. Skin staples project near the midline over the lower abdomen. Residual contrast in the colon. IMPRESSION: Interval removal of the enteric tube. Nonobstructive bowel-gas pattern. Electronically Signed   By: Emmit Alexanders M.D.   On: 11/11/2022 11:25    Anti-infectives: Anti-infectives (From admission, onward)    Start     Dose/Rate Route Frequency Ordered Stop   11/06/22 1345  ceFAZolin (ANCEF) IVPB 2g/100 mL premix        2 g 200 mL/hr over 30 Minutes Intravenous  Once 11/06/22 1335 11/06/22 1610        Assessment/Plan SBO POD 6 s/p ex-lap with LOA 3/28 Dr. Kae Heller - BMx3 last 24 hours - advance to SOFT diet and stop TPN today - continue stool softeners and BID Miralax - mobilize as able; PT recommending no follow up - from a surgical standpoint I think this patient would be stable for discharge as early as tomorrow if she continues to have bowel function and soft  diet goes well.  FEN: SOFT, IVF per TRH VTE: start LMWH ID: ancef pre-op  LOS: 9 days     Jill Alexanders, [facility] Surgery 11/12/2022, 9:38 AM Please see Amion for pager number during day hours 7:00am-4:30pm

## 2022-11-12 NOTE — NC FL2 (Signed)
[place] LEVEL OF CARE FORM     IDENTIFICATION  Patient Name: Rhonda Watkins Birthdate: [birth year] Sex: female Admission Date (Current Location): 11/03/2022  [facility] and [place] Number:  Herbalist and Address:  [facility],  [place] [place], [place]      Provider Number: [id number]  Attending Physician Name and Address:  Aline August, MD  Relative Name and Phone Number:  Lanah Hagadone (son) Ph: [phone]    Current Level of Care: Hospital Recommended Level of Care: [place] Prior Approval Number:    Date Approved/Denied:   PASRR Number: [id number]  Discharge Plan: SNF    Current Diagnoses: Patient Active Problem List   Diagnosis Date Noted   SBO (small bowel obstruction) 11/03/2022   Age-related osteoporosis without current pathological fracture 12/24/2020   Hyperglycemia due to type 2 diabetes mellitus 12/24/2020   Mild persistent asthma, uncomplicated [id number]   Type 2 diabetes mellitus with other specified complication [id number]   Body mass index (BMI) 25.0-25.9, adult 03/09/2020   Lumbago of lumbar region with sciatica 07/14/2019   Bilateral hand pain 06/26/2017   Ganglion cyst of flexor tendon sheath of finger of right hand 06/26/2017   Primary osteoarthritis of both hands 06/26/2017   Trigger ring finger of right hand 06/26/2017   Ingrown right big toenail 05/08/2016   Displacement of cervical intervertebral disc without myelopathy 03/01/2015   Bursitis 01/30/2015   HNP (herniated nucleus pulposus), lumbar 01/12/2015   Displacement of lumbar intervertebral disc without myelopathy 02/16/2014   Essential hypertension 09/17/2013   Mitral regurgitation 09/17/2013   GERD (gastroesophageal reflux disease) 09/17/2013   Spinal stenosis 09/17/2013   Hypercholesterolemia 09/17/2013    Orientation RESPIRATION BLADDER Height & Weight     Self, Time, Situation, Place  Normal Continent Weight:  146 lb 13.2 oz (66.6 kg) Height:  5\' 2"  (157.5 cm)  BEHAVIORAL SYMPTOMS/MOOD NEUROLOGICAL BOWEL NUTRITION STATUS   (N/A)  (N/A) Continent Diet (Soft diet)  AMBULATORY STATUS COMMUNICATION OF NEEDS Skin   Extensive Assist Verbally Surgical wounds                       Personal Care Assistance Level of Assistance  Bathing, Feeding, Dressing Bathing Assistance: Limited assistance Feeding assistance: Independent Dressing Assistance: Limited assistance     Functional Limitations Info  Sight, Hearing, Speech Sight Info: Impaired Hearing Info: Impaired Speech Info: Adequate    SPECIAL CARE FACTORS FREQUENCY  PT (By licensed PT), OT (By licensed OT)     PT Frequency: 5x's/week OT Frequency: 5x's/week            Contractures Contractures Info: Not present    Additional Factors Info  Code Status, Allergies, Insulin Sliding Scale Code Status Info: Full Allergies Info: Jardiance (Empagliflozin), Linagliptin, Metformin And Related Psychotropic Info: N/A Insulin Sliding Scale Info: See discharge summary       Current Medications (11/12/2022):  This is the current hospital active medication list Current Facility-Administered Medications  Medication Dose Route Frequency Provider Last Rate Last Admin   acetaminophen (TYLENOL) tablet 650 mg  650 mg Oral Q6H Norm Parcel, PA-C   650 mg at 11/12/22 1107   albuterol (PROVENTIL) (2.5 MG/3ML) 0.083% nebulizer solution 2.5 mg  2.5 mg Inhalation Q6H PRN Samuella Cota, MD   2.5 mg at 11/09/22 2014   alum & mag hydroxide-simeth (MAALOX/MYLANTA) 200-200-20 MG/5ML suspension 30 mL  30 mL Oral Q4H PRN Samuella Cota, MD  30 mL at 11/11/22 0849   budesonide (PULMICORT) nebulizer solution 0.5 mg  0.5 mg Nebulization BID Samuella Cota, MD   0.5 mg at 11/12/22 [id number]   Chlorhexidine Gluconate Cloth 2 % PADS 6 each  6 each Topical Daily Samuella Cota, MD   6 each at 11/12/22 0903   enoxaparin (LOVENOX) injection 40 mg  40 mg  Subcutaneous Daily Norm Parcel, PA-C   40 mg at 11/12/22 [id number]   famotidine (PEPCID) tablet 20 mg  20 mg Oral q1800 Samuella Cota, MD   20 mg at 11/11/22 1740   feeding supplement (GLUCERNA SHAKE) (GLUCERNA SHAKE) liquid 237 mL  237 mL Oral TID BM Samuella Cota, MD   237 mL at 11/12/22 1055   furosemide (LASIX) tablet 40 mg  40 mg Oral Daily Samuella Cota, MD   40 mg at 11/12/22 [id number]   hydrALAZINE (APRESOLINE) injection 10 mg  10 mg Intravenous Q6H PRN Romana Juniper A, MD   10 mg at 11/05/22 1518   HYDROmorphone (DILAUDID) injection 0.5 mg  0.5 mg Intravenous Q2H PRN Norm Parcel, PA-C   0.5 mg at 11/10/22 0403   insulin aspart (novoLOG) injection 0-15 Units  0-15 Units Subcutaneous TID WC Alekh, Kshitiz, MD       insulin aspart (novoLOG) injection 0-5 Units  0-5 Units Subcutaneous QHS Belenda Cruise, Amy W, RPH       ondansetron [facility]) injection 4 mg  4 mg Intravenous Q6H PRN Clovis Riley, MD   4 mg at 11/09/22 1732   Oral care mouth rinse  15 mL Mouth Rinse PRN Samuella Cota, MD       oxyCODONE (Oxy IR/ROXICODONE) immediate release tablet 5-10 mg  5-10 mg Oral Q4H PRN Norm Parcel, PA-C       pantoprazole (PROTONIX) EC tablet 40 mg  40 mg Oral QHS Wofford, Drew A, RPH       phenol (CHLORASEPTIC) mouth spray 1 spray  1 spray Mouth/Throat PRN Romana Juniper A, MD   1 spray at 11/06/22 0910   polyethylene glycol (MIRALAX / GLYCOLAX) packet 17 g  17 g Oral BID Jill Alexanders, PA-C   17 g at 11/12/22 0901   sodium chloride flush (NS) 0.9 % injection 10-40 mL  10-40 mL Intracatheter Q12H Samuella Cota, MD   10 mL at 11/12/22 [id number]   sodium chloride flush (NS) 0.9 % injection 10-40 mL  10-40 mL Intracatheter PRN Samuella Cota, MD         Discharge Medications: Please see discharge summary for a list of discharge medications.  Relevant Imaging Results:  Relevant Lab Results:   Additional Information SSN: [id number]  Sherie Don,  LCSW

## 2022-11-12 NOTE — TOC Progression Note (Addendum)
Transition of Care [facility]) - Progression Note   Patient Details  Name: Rhonda Watkins MRN: [id number] Date of Birth: May 30, 1932  Transition [facility]) CM/SW [place], LCSW Phone Number: 11/12/2022, 1:03 PM  Clinical Narrative: PT evaluated patient again today and due to limited progress, SNF is now being recommended. CSW followed up with patient and son regarding recommendations. Son asked about an FL2 and TB skin test for respite care at [facility]. CSW explained PT is recommending SNF and patient will be medically ready for discharge before the TB skin can be read. CSW confirmed with Lattie Haw at [facility] that patient will need SNF before rehab and the respite spot will not be available for at least 8 days. CSW explained this to son and he is agreeable to CSW starting the bed search. FL2 done; PASRR received. Initial referral faxed out. TOC awaiting bed offers.  Addendum: Patient has received bed offers and son is agreeable to [place]. CSW confirmed with Linus Orn in admissions the bed will be available tomorrow pending insurance authorization and patient being medically stable.  CSW completed insurance authorization on the [employer] portal. Reference ID # is: [id number]. Patient has been approved for 11/13/2022-11/17/2022. CSW updated Linus Orn.  Expected Discharge Plan: [place] Barriers to Discharge: Continued Medical Work up  Expected Discharge Plan and Services In-house Referral: Clinical Social Work Post Acute Care Choice: [place] arrangements for the past 2 months: Apartment          DME Arranged: N/A DME Agency: NA HH Arranged: PT [place] Agency: [place] Date [place]: 11/07/22 Representative spoke with at [place]: Cindie  Social Determinants of Health (SDOH) Interventions SDOH Screenings   Depression (PHQ2-9): Low Risk  (03/19/2020)  Tobacco Use: Medium Risk (11/07/2022)   Readmission Risk Interventions      No data to display

## 2022-11-12 NOTE — Progress Notes (Addendum)
PHARMACY - TOTAL PARENTERAL NUTRITION CONSULT NOTE   Indication: Prolonged ileus  Patient Measurements: Height: 5\' 2"  (157.5 cm) Weight: 66.6 kg (146 lb 13.2 oz) IBW/kg (Calculated) : 50.1 TPN AdjBW (KG): 53.7 Body mass index is 26.85 kg/m. Usual Weight:   Assessment: 40 yoF admitted on 3/25 with SBO that failed to improve with conservative management.  She underwent exploratory laparotomy 3/28 with lysis of extensive intra-abdominal adhesions.  Pharmacy is consulted to dose TPN.    Glucose / Insulin: Hx of DM, on Lantus 12 units daily PTA  - on rSSI q6h (used 19 units in the past 24 hrs) - CBGs (goal <150): range 155-208 - only one reading in the 200s - Insulin glargine 5 units given on 3/30 afternoon, transitioned to insulin in TPN bag on 3/31.   Electrolytes:  No new labs drawn 4/3.  On 4/2 all lytes WNL - Goal for ileus: K > 4, Mag > 2, phos ~3 Renal: 4/2: SCr, BUN WNL Hepatic: 4/1: LFTs WNL - Trig:  77 (3/29), 192 (40/1) Intake / Output; MIVF:  - I/O: +859 mL - good UOP - 4/2 -- 2 documented stool occurrences  - 4/2 -- 4 documented urine occurrences  GI Imaging:  - 3/25 CT abdomen: SBO - 3/30 Xray: Nonobstructive bowel gas pattern. GI Surgeries / Procedures:  - 3/28 ex lap for LOA and SBO   Central access: PICC placed 3/29 TPN start date: 3/29  Nutritional Goals: Goal TPN rate is 75 mL/hr (provides 81 g of protein and 1746 kcals per day)   RD Assessment: Estimated Needs Total Energy Estimated Needs: 1600-1800 Total Protein Estimated Needs: 80-95g Total Fluid Estimated Needs: >/= 2L  Current Nutrition:  4/3-  Soft Diet started  Plan:   - Per CCS, discontinue TPN today, 11/12/22 morning. - Moderate SSI with meals starting at 1200 and bedtime SSI.   Mendel Ryder, PharmD Clinical Pharmacist 11/12/2022 7:51 AM

## 2022-11-12 NOTE — Progress Notes (Signed)
PROGRESS NOTE    Rhonda Watkins  [id number] DOB: 1932/01/23 DOA: 11/03/2022 PCP: Kathalene Frames, MD   Brief Narrative:  89 year old female with history of hypertension, diabetes mellitus type 2, asthma presented with worsening abdominal pain and was found to have SBO.  She failed to improve with conservative treatment.  She underwent exploratory laparotomy on 11/06/2022 by general surgery.  Now TPN being weaned off.  Condition slowly improving.  Assessment & Plan:   SBO -Failed to improve with conservative management,status post exploratory laparotomy 11/06/22 -Continue management as per general surgery: Weaning TPN; diet advanced.  As per general surgery. -Improving gradually  Diabetes mellitus type 2 with hyperglycemia -continue CBGs with SSI  Hyperlipidemia -Will resume atorvastatin when able to take p.o. consistently  Essential hypertension -Monitor blood pressure.  Home Lasix has been resumed.  Irbesartan and diltiazem are still on hold.  Leukocytosis -Resolved   DVT prophylaxis: Lovenox Code Status: Full Family Communication: None at bedside Disposition Plan: Status is: Inpatient Remains inpatient appropriate because: Of severity of illness    Consultants: General surgery  Procedures: Exploratory laparotomy with lysis of adhesions  Antimicrobials:  Anti-infectives (From admission, onward)    Start     Dose/Rate Route Frequency Ordered Stop   11/06/22 1345  ceFAZolin (ANCEF) IVPB 2g/100 mL premix        2 g 200 mL/hr over 30 Minutes Intravenous  Once 11/06/22 1335 11/06/22 1610        Subjective: Patient seen and examined at bedside.  Extremely hard of hearing.  Still has intermittent nausea but denies any vomiting.  Tolerating diet.  No fever or chest pain reported..  Objective: Vitals:   11/11/22 1940 11/11/22 2105 11/12/22 0555 11/12/22 0825  BP:  (!) 122/107 (!) 135/56   Pulse:  72 71   Resp:  18 18   Temp:  98.4 F (36.9 C) 98.5 F  (36.9 C)   TempSrc:  Oral Oral   SpO2: 96% 99% 98% 99%  Weight:      Height:        Intake/Output Summary (Last 24 hours) at 11/12/2022 1004 Last data filed at 11/12/2022 0600 Gross per 24 hour  Intake 2889.31 ml  Output 1800 ml  Net 1089.31 ml   Filed Weights   11/03/22 1449 11/03/22 2115 11/10/22 1359  Weight: 59 kg 64.6 kg 66.6 kg    Examination:  General exam: Appears calm and comfortable.  Looks chronically ill and deconditioned.  On room air.  Extremely hard of hearing. Respiratory system: Bilateral decreased breath sounds at bases Cardiovascular system: S1 & S2 heard, Rate controlled Gastrointestinal system: Abdomen is distended, soft and mildly tender.  Incision present.  Hypoactive bowel sounds.   Extremities: No cyanosis, clubbing, edema   Data Reviewed: I have personally reviewed following labs and imaging studies  CBC: Recent Labs  Lab 11/06/22 1139 11/07/22 0419 11/08/22 0253 11/10/22 0305  WBC 15.7* 12.5* 12.4* 9.3  HGB 13.6 13.9 12.9 11.0*  HCT 43.9 46.6* 40.6 34.4*  MCV 100.5* 102.9* 96.7 95.0  PLT 193 189 211 [id number]   Basic Metabolic Panel: Recent Labs  Lab 11/07/22 0419 11/08/22 0253 11/09/22 0247 11/10/22 0305 11/11/22 0415  NA 140 143 142 138 137  K 4.2 3.7 3.6 3.7 4.3  CL 114* 115* 113* 104 102  CO2 16* 24 24 26 29   GLUCOSE 153* 208* 198* 196* 153*  BUN 15 17 14 13 15   CREATININE 0.52 0.48 0.40* 0.53 0.51  CALCIUM 8.6* 8.6*  8.0* 7.9* 8.4*  MG 2.3 2.0 1.9 1.8 2.3  PHOS 3.6 1.8* 2.4* 3.6 3.6   GFR: Estimated Creatinine Clearance: 43.5 mL/min (by C-G formula based on SCr of 0.51 mg/dL). Liver Function Tests: Recent Labs  Lab 11/07/22 0419 11/10/22 0305  AST 12* 14*  ALT 12 12  ALKPHOS 69 57  BILITOT 0.9 0.5  PROT 5.6* 4.9*  ALBUMIN 2.8* 2.2*   No results for input(s): "LIPASE", "AMYLASE" in the last 168 hours. No results for input(s): "AMMONIA" in the last 168 hours. Coagulation Profile: No results for input(s): "INR",  "PROTIME" in the last 168 hours. Cardiac Enzymes: No results for input(s): "CKTOTAL", "CKMB", "CKMBINDEX", "TROPONINI" in the last 168 hours. BNP (last 3 results) No results for input(s): "PROBNP" in the last 8760 hours. HbA1C: No results for input(s): "HGBA1C" in the last 72 hours. CBG: Recent Labs  Lab 11/11/22 0513 11/11/22 1152 11/11/22 1725 11/11/22 2342 11/12/22 0548  GLUCAP 185* 208* 155* 180* 178*   Lipid Profile: Recent Labs    11/10/22 0305  TRIG 192*   Thyroid Function Tests: No results for input(s): "TSH", "T4TOTAL", "FREET4", "T3FREE", "THYROIDAB" in the last 72 hours. Anemia Panel: No results for input(s): "VITAMINB12", "FOLATE", "FERRITIN", "TIBC", "IRON", "RETICCTPCT" in the last 72 hours. Sepsis Labs: No results for input(s): "PROCALCITON", "LATICACIDVEN" in the last 168 hours.  No results found for this or any previous visit (from the past 240 hour(s)).       Radiology Studies: DG Abd Portable 1V  Result Date: 11/11/2022 CLINICAL DATA:  Ileus. EXAM: PORTABLE ABDOMEN - 1 VIEW COMPARISON:  11/08/2022. FINDINGS: Interval removal of the enteric tube. The bowel gas pattern is normal. Skin staples project near the midline over the lower abdomen. Residual contrast in the colon. IMPRESSION: Interval removal of the enteric tube. Nonobstructive bowel-gas pattern. Electronically Signed   By: Emmit Alexanders M.D.   On: 11/11/2022 11:25        Scheduled Meds:  acetaminophen  650 mg Oral Q6H   budesonide  0.5 mg Nebulization BID   Chlorhexidine Gluconate Cloth  6 each Topical Daily   enoxaparin (LOVENOX) injection  40 mg Subcutaneous Daily   famotidine  20 mg Oral q1800   feeding supplement (GLUCERNA SHAKE)  237 mL Oral TID BM   furosemide  40 mg Oral Daily   insulin aspart  0-15 Units Subcutaneous TID WC   insulin aspart  0-5 Units Subcutaneous QHS   pantoprazole  40 mg Oral QHS   polyethylene glycol  17 g Oral BID   sodium chloride flush  10-40 mL  Intracatheter Q12H   Continuous Infusions:        Aline August, MD [facility] 11/12/2022, 10:04 AM

## 2022-11-12 NOTE — Telephone Encounter (Signed)
Received a fax from the pharmacy stating that Asmanex is no longer covered by patients insurance. They state covered alternatives are QVAR, Arnuity, or Flovent, do you want to switch to any of these?

## 2022-11-12 NOTE — Progress Notes (Signed)
Sending in [place] to replace [employer] as it is no longer covered.

## 2022-11-13 DIAGNOSIS — K56609 Unspecified intestinal obstruction, unspecified as to partial versus complete obstruction: Secondary | ICD-10-CM | POA: Diagnosis not present

## 2022-11-13 LAB — GLUCOSE, CAPILLARY
Glucose-Capillary: 153 mg/dL — ABNORMAL HIGH (ref 70–99)
Glucose-Capillary: 119 mg/dL — ABNORMAL HIGH (ref 70–99)

## 2022-11-13 MED ORDER — FLUTICASONE PROPIONATE 50 MCG/ACT NA SUSP: 2.0000 | Freq: Every day | NASAL | Status: DC | PRN

## 2022-11-13 MED ORDER — SENNA 8.6 MG PO TABS: 2.0000 | ORAL_TABLET | Freq: Every day | ORAL | 0 refills | Status: DC

## 2022-11-13 MED ORDER — SENNA 8.6 MG PO TABS
2.0000 | ORAL_TABLET | Freq: Every day | ORAL | Status: DC
  Administered 2022-11-13: 17.2 mg via ORAL
  Filled 2022-11-13: qty 2

## 2022-11-13 MED ORDER — ACETAMINOPHEN 325 MG PO TABS: 650.0000 mg | ORAL_TABLET | Freq: Four times a day (QID) | ORAL | Status: DC

## 2022-11-13 MED ORDER — POLYETHYLENE GLYCOL 3350 17 G PO PACK: 17.0000 g | PACK | Freq: Two times a day (BID) | ORAL | 0 refills | Status: DC

## 2022-11-13 MED ORDER — FAMOTIDINE 40 MG PO TABS: 20.0000 mg | ORAL_TABLET | Freq: Every day | ORAL | Status: DC

## 2022-11-13 MED ORDER — OMEPRAZOLE 20 MG PO CPDR: 20.0000 mg | DELAYED_RELEASE_CAPSULE | Freq: Every day | ORAL | Status: DC | PRN

## 2022-11-13 NOTE — Telephone Encounter (Signed)
Called and left a voicemail asking for return call to discuss.  

## 2022-11-13 NOTE — TOC Transition Note (Addendum)
Transition of Care [facility]) - CM/SW Discharge Note  Patient Details  Name: Rhonda Watkins MRN: [id number] Date of Birth: 1931/10/30  Transition of Care [facility]) CM/SW Contact:  Sherie Don, LCSW Phone Number: 11/13/2022, 11:28 AM  Clinical Narrative: Patient is medically stable for discharge to [facility]. Patient will go to room 210 and the number [phone]. Discharge summary, discharge orders, and SNF transfer report faxed to facility in hub. Medical necessity form done; PTAR scheduled. Discharge packet completed. Patient and son notified of insurance approval and transportation being set up. RN updated. [facility] to follow patient at [facility]. TOC signing off.  Final next level of care: Skilled Nursing Facility Barriers to Discharge: Barriers Resolved  Patient Goals and CMS Choice CMS Medicare.gov Compare Post Acute Care list provided to:: Patient Choice offered to / list presented to : Patient  Discharge Placement PASRR number recieved: 11/12/22   Patient chooses bed at: [facility] Patient to be transferred to facility by: [place] Name of family member notified: Elliona Duffer (son) Patient and family notified of of transfer: 11/13/22  Discharge Plan and Services Additional resources added to the After Visit Summary for   In-house Referral: Clinical Social Work Post Acute Care Choice: Home Health          DME Arranged: N/A DME Agency: NA HH Arranged: PT HH Agency: [place] Date [facility] Agency Contacted: 11/07/22 Representative spoke with at [place]: Cindie  Social Determinants of Health (SDOH) Interventions SDOH Screenings   Depression (PHQ2-9): Low Risk  (03/19/2020)  Tobacco Use: Medium Risk (11/07/2022)   Readmission Risk Interventions     No data to display

## 2022-11-13 NOTE — Progress Notes (Signed)
Patient and discharge packet was picked up by PTAR. Report was called prior to patient leaving to Medical illustrator at [facility]. Patient was stable for discharge.

## 2022-11-13 NOTE — Progress Notes (Signed)
Progress Note  7 Days Post-Op  Subjective: Tolerating SOFT diet. Reports some bloating after dinner. +flatus. Last reported BM day before yesterday.  Objective: Vital signs in last 24 hours: Temp:  [97.9 F (36.6 C)-98.5 F (36.9 C)] 98.5 F (36.9 C) (04/04 0420) Pulse Rate:  [79-87] 87 (04/04 0420) Resp:  [16-18] 18 (04/04 0420) BP: (113-138)/(49-56) 138/56 (04/04 0420) SpO2:  [96 %-99 %] 97 % (04/04 0420) Last BM Date : 11/11/22  Intake/Output from previous day: 04/03 0701 - 04/04 0700 In: 717 [P.O.:717] Out: 450 [Urine:450] Intake/Output this shift: Total I/O In: -  Out: 150 [Urine:150]  PE: General: pleasant, WD, elderly female who is laying in bed in NAD HEENT: HOH Heart: regular, rate, and rhythm.   Lungs: CTAB, no wheezes, rhonchi, or rales noted.  Respiratory effort nonlabored Abd: soft, appropriately ttp, mild distention, incision appears C/D/I- honeycomb removed and inicion appears well healing without cellulitis - staples in tact.   Psych: A&Ox3 with an appropriate affect.    Lab Results:  No results for input(s): "WBC", "HGB", "HCT", "PLT" in the last 72 hours.  BMET Recent Labs    11/11/22 0415  NA 137  K 4.3  CL 102  CO2 29  GLUCOSE 153*  BUN 15  CREATININE 0.51  CALCIUM 8.4*   PT/INR No results for input(s): "LABPROT", "INR" in the last 72 hours. CMP     Component Value Date/Time   NA 137 11/11/2022 0415   K 4.3 11/11/2022 0415   CL 102 11/11/2022 0415   CO2 29 11/11/2022 0415   GLUCOSE 153 (H) 11/11/2022 0415   BUN 15 11/11/2022 0415   CREATININE 0.51 11/11/2022 0415   CALCIUM 8.4 (L) 11/11/2022 0415   PROT 4.9 (L) 11/10/2022 0305   ALBUMIN 2.2 (L) 11/10/2022 0305   AST 14 (L) 11/10/2022 0305   ALT 12 11/10/2022 0305   ALKPHOS 57 11/10/2022 0305   BILITOT 0.5 11/10/2022 0305   GFRNONAA >60 11/11/2022 0415   GFRAA >60 05/15/2020 0300   Lipase     Component Value Date/Time   LIPASE 25 11/03/2022 1423        Studies/Results: DG Abd Portable 1V  Result Date: 11/11/2022 CLINICAL DATA:  Ileus. EXAM: PORTABLE ABDOMEN - 1 VIEW COMPARISON:  11/08/2022. FINDINGS: Interval removal of the enteric tube. The bowel gas pattern is normal. Skin staples project near the midline over the lower abdomen. Residual contrast in the colon. IMPRESSION: Interval removal of the enteric tube. Nonobstructive bowel-gas pattern. Electronically Signed   By: Emmit Alexanders M.D.   On: 11/11/2022 11:25    Anti-infectives: Anti-infectives (From admission, onward)    Start     Dose/Rate Route Frequency Ordered Stop   11/06/22 1345  ceFAZolin (ANCEF) IVPB 2g/100 mL premix        2 g 200 mL/hr over 30 Minutes Intravenous  Once 11/06/22 1335 11/06/22 1610        Assessment/Plan SBO POD 7 s/p ex-lap with LOA 3/28 Dr. Kae Heller - BMx3 4/2-4/3 - continue soft diet, encourage 5-6 small meals daily and protein shakes rather than 3 large meals. - add Senna-S daily, continue BID miralax - from a surgical standpoint she is stable for discharge to SNF. Follow up provided. She has not taken oxycodone and pain has been controlled with tylenol so no Rx provided.  FEN: SOFT, IVF per TRH VTE: start LMWH ID: ancef pre-op  LOS: 10 days     Jill Alexanders, [facility] Surgery 11/13/2022,  8:52 AM Please see Amion for pager number during day hours 7:00am-4:30pm

## 2022-11-13 NOTE — Discharge Summary (Signed)
Physician Discharge Summary  Rhonda Watkins [id number] DOB: 05/07/1932 DOA: 11/03/2022  PCP: Kathalene Frames, MD  Admit date: 11/03/2022 Discharge date: 11/13/2022  Admitted From: Home Disposition: SNF  Recommendations for Outpatient Follow-up:  Follow up with SNF provider at earliest convenience Outpatient follow-up with general surgery. Follow up in ED if symptoms worsen or new appear   Home Health: No Equipment/Devices: None  Discharge Condition: Stable CODE STATUS: Full Diet recommendation: Heart healthy/carb modified/soft diet  Brief/Interim Summary: 89 year old female with history of hypertension, diabetes mellitus type 2, asthma presented with worsening abdominal pain and was found to have SBO. She failed to improve with conservative treatment. She underwent exploratory laparotomy on 11/06/2022 by general surgery.  Subsequently, TPN has been weaned off.  Condition slowly improving.  She is currently tolerating diet and having bowel movements.  General surgery has cleared the patient for discharge.  She will be discharged to SNF today.  Discharge Diagnoses:   SBO -Failed to improve with conservative management,status post exploratory laparotomy 11/06/22 -Continue management as per general surgery:  Subsequently, TPN has been weaned off.  Condition slowly improving.  She is currently tolerating diet and having bowel movements.  General surgery has cleared the patient for discharge.  She will be discharged to SNF today. -Outpatient follow-up with general surgery   Diabetes mellitus type 2 with hyperglycemia -Carb modified diet.  Resume home regimen.   Hyperlipidemia -Will resume atorvastatin on discharge.   Essential hypertension -Monitor blood pressure.  Home Lasix has been resumed.  Irbesartan and diltiazem are still on hold.  These can be resumed as an outpatient if blood pressure remains elevated.   Leukocytosis -Resolved  Discharge Instructions  Discharge  Instructions     Diet - low sodium heart healthy   Complete by: As directed    Soft diet   Increase activity slowly   Complete by: As directed       Allergies as of 11/13/2022       Reactions   Jardiance [empagliflozin] Other (See Comments)   " It made me really tired"    Linagliptin Other (See Comments)   Tradjenta- Reaction not known by family   Metformin And Related Other (See Comments)   "It made me really tired "        Medication List     STOP taking these medications    diltiazem 240 MG 24 hr capsule Commonly known as: CARDIZEM CD   docusate sodium 100 MG capsule Commonly known as: COLACE   doxycycline 100 MG capsule Commonly known as: MONODOX   polyethylene glycol powder 17 GM/SCOOP powder Commonly known as: MiraLax Replaced by: polyethylene glycol 17 g packet   Potassium Chloride CR 8 MEQ Cpcr capsule CR Commonly known as: MICRO-K   telmisartan 80 MG tablet Commonly known as: MICARDIS       TAKE these medications    acetaminophen 325 MG tablet Commonly known as: TYLENOL Take 2 tablets (650 mg total) by mouth every 6 (six) hours.   albuterol 108 (90 Base) MCG/ACT inhaler Commonly known as: VENTOLIN HFA Inhale 1-2 puffs into the lungs every 6 (six) hours as needed for wheezing or shortness of breath.   Arnuity Ellipta 100 MCG/ACT Aepb Generic drug: Fluticasone Furoate Inhale 1 puff into the lungs daily.   atorvastatin 10 MG tablet Commonly known as: LIPITOR Take 10 mg by mouth daily.   chlorhexidine 0.12 % solution Commonly known as: PERIDEX Use as directed 15 mLs in the mouth or throat 2 (  two) times daily as needed (as directed).   famotidine 40 MG tablet Commonly known as: PEPCID Take 0.5 tablets (20 mg total) by mouth daily.   fluticasone 50 MCG/ACT nasal spray Commonly known as: FLONASE Place 2 sprays into both nostrils daily as needed for allergies or rhinitis.   furosemide 40 MG tablet Commonly known as: LASIX Take 40 mg by  mouth daily.   guaiFENesin 600 MG 12 hr tablet Commonly known as: MUCINEX Take 600 mg by mouth 2 (two) times daily as needed for cough or to loosen phlegm.   Jardiance 25 MG Tabs tablet Generic drug: empagliflozin Take 25 mg by mouth daily.   Lancets Misc twice a day as directed   Lantus SoloStar 100 UNIT/ML Solostar Pen Generic drug: insulin glargine Inject 12 Units into the skin at bedtime.   Lipo Flavonoid Plus Tabs Take 1 tablet by mouth 2 (two) times daily.   omeprazole 20 MG capsule Commonly known as: PRILOSEC Take 1 capsule (20 mg total) by mouth daily as needed (for heartburn). What changed: See the new instructions.   OneTouch Verio test strip Generic drug: glucose blood CHECK BLOOD SUGAR TWICE DAILY AS DIRECTED.   polyethylene glycol 17 g packet Commonly known as: MIRALAX / GLYCOLAX Take 17 g by mouth 2 (two) times daily. Replaces: polyethylene glycol powder 17 GM/SCOOP powder   senna 8.6 MG Tabs tablet Commonly known as: SENOKOT Take 2 tablets (17.2 mg total) by mouth daily.   Sure Comfort Pen Needles 31G X 8 MM Misc Generic drug: Insulin Pen Needle SMARTSIG:Injection   Vitamin D3 75 MCG (3000 UT) Tabs Generic drug: Cholecalciferol Take 3,000 Units by mouth daily.        Contact information for follow-up providers     Care, [facility] Follow up.   Specialty: Home Health Services Why: Alvis Lemmings will provide PT in the home after discharge. Contact information: [place] [address] [phone]         Clovis Riley, MD. Go on 12/11/2022.   Specialty: General Surgery Why: at 9:00 AM for post-operative follow up with your surgeon. please arrive 20 minutes early. Contact information: [address] [place] [address] [phone]         Surgery, [place]. Go on 11/20/2022.   Specialty: General Surgery Why: at 10:00 AM for staple removal by a nurse. please arrive 20-30 minutes  early. Contact information: [address] [place]  [address] [phone]              Contact information for after-discharge care     Destination     [facility] Preferred SNF .   Service: Skilled Nursing Contact information: [place] [phone]                    Allergies  Allergen Reactions   Jardiance [Empagliflozin] Other (See Comments)    " It made me really tired"    Linagliptin Other (See Comments)    Tradjenta- Reaction not known by family   Metformin And Related Other (See Comments)    "It made me really tired "    Consultations: General surgery   Procedures/Studies: DG Abd Portable 1V  Result Date: 11/11/2022 CLINICAL DATA:  Ileus. EXAM: PORTABLE ABDOMEN - 1 VIEW COMPARISON:  11/08/2022. FINDINGS: Interval removal of the enteric tube. The bowel gas pattern is normal. Skin staples project near the  midline over the lower abdomen. Residual contrast in the colon. IMPRESSION: Interval removal of the enteric tube. Nonobstructive bowel-gas pattern. Electronically Signed   By: Emmit Alexanders M.D.   On: 11/11/2022 11:25   DG Abd Portable 1V  Result Date: 11/08/2022 CLINICAL DATA:  [id number] Ileus [id number] EXAM: PORTABLE ABDOMEN - 1 VIEW COMPARISON:  11/06/2022 FINDINGS: Gastric tube remains in decompressed stomach. Scattered gas-filled nondilated small bowel loops, improved since previous. Residual contrast in the nondilated colon. Cholecystectomy clips. Lumbar fixation hardware. Skin staples project near the midline over the lower abdomen. IMPRESSION: Nonobstructive bowel gas pattern. Electronically Signed   By: Lucrezia Europe M.D.   On: 11/08/2022 11:25   [place] EKG SITE RITE  Result Date: 11/06/2022 If Site Rite image not attached, placement could not be confirmed due to current cardiac rhythm.  DG Abd Portable 1V  Result Date: 11/06/2022 CLINICAL DATA:  Small bowel obstruction.  EXAM: PORTABLE ABDOMEN - 1 VIEW COMPARISON:  11/05/2022 FINDINGS: Enteric tube tip overlies the level of the stomach. There is persistent dilatation a small bowel loops, primarily within the central abdomen and LEFT mid abdomen and similar to prior study. Stool and gas are identified throughout nondilated loops of colon. There is no free intraperitoneal air. Postoperative changes in the lumbar spine. IMPRESSION: 1. Enteric tube tip overlies the stomach. 2. Persistent small bowel dilatation. 3. No free intraperitoneal air. Electronically Signed   By: Nolon Nations M.D.   On: 11/06/2022 07:36   DG Abd Portable 1V  Result Date: 11/05/2022 CLINICAL DATA:  Small-bowel obstruction EXAM: PORTABLE ABDOMEN - 1 VIEW COMPARISON:  Abdominal radiograph dated 11/04/2022, CT abdomen and pelvis dated 11/03/2022 FINDINGS: Enteric tube tip projects over the stomach. Nonspecific bowel gas pattern. Multiple gas-filled loops of small bowel within the left hemiabdomen moderate volume stool throughout the colon. No abnormal bowel dilation. No free air or pneumatosis. Excreted contrast material within the urinary bladder. Irregular radiodensity projects over the right upper quadrant, likely superimposition of osseous structures. Postsurgical changes from L3-4 lumbar fixation. Right upper quadrant surgical clips IMPRESSION: Nonspecific bowel gas pattern. Multiple gas-filled loops of small bowel within the left hemiabdomen without abnormal bowel dilation. Electronically Signed   By: Darrin Nipper M.D.   On: 11/05/2022 08:21   DG Abd Portable 1V-Small Bowel Obstruction Protocol-initial, 8 hr delay  Result Date: 11/04/2022 CLINICAL DATA:  Small bowel obstruction. EXAM: PORTABLE ABDOMEN - 1 VIEW COMPARISON:  November 03, 2022. FINDINGS: Nasogastric tube tip is seen in proximal stomach. Residual contrast is noted in the urinary bladder. Some contrast is noted in mildly dilated small bowel loops in the left side of the abdomen. No contrast is  noted in the colon at this time. IMPRESSION: Some contrast is noted in mildly dilated small bowel loops in the left side of the abdomen. No definite contrast is seen in the colon. Continued follow-up radiographs are recommended. Electronically Signed   By: Marijo Conception M.D.   On: 11/04/2022 19:52   DG Abd Portable 1 View  Result Date: 11/03/2022 CLINICAL DATA:  NG placement EXAM: PORTABLE ABDOMEN - 1 VIEW COMPARISON:  CT abdomen pelvis 11/03/2022 FINDINGS: Enteric tube courses below the hemidiaphragm with tip and side port overlying the gastric lumen the bowel gas pattern is normal. Right upper quadrant clips with inferior to this an 8 mm calcification that is noted to be related to the proximal pancreas/pancreatic duct on CT. Aortic calcification. Lower abdomen and pelvis collimated off view. IMPRESSION: 1. Enteric tube in good  position. 2.  Aortic Atherosclerosis (ICD10-I70.0). Electronically Signed   By: Iven Finn M.D.   On: 11/03/2022 19:29   CT Abdomen Pelvis W Contrast  Result Date: 11/03/2022 CLINICAL DATA:  Crampy abdominal pain with nausea. EXAM: CT ABDOMEN AND PELVIS WITH CONTRAST TECHNIQUE: Multidetector CT imaging of the abdomen and pelvis was performed using the standard protocol following bolus administration of intravenous contrast. RADIATION DOSE REDUCTION: This exam was performed according to the departmental dose-optimization program which includes automated exposure control, adjustment of the mA and/or kV according to patient size and/or use of iterative reconstruction technique. CONTRAST:  147mL OMNIPAQUE IOHEXOL 300 MG/ML  SOLN COMPARISON:  CT 05/15/2020.  Older exams as well. FINDINGS: Lower chest: Stable 3 mm left lower lobe lung nodule demonstrating long-term stability. No specific imaging follow-up. There is some scarring atelectatic changes along the lung bases. Coronary artery calcifications are seen. Small hiatal hernia. Slight wall thickening as well along the distal  esophagus. Please correlate with any particular symptoms. Hepatobiliary: Previous cholecystectomy. Patent portal vein. There are some tiny low-attenuation lesions identified in the liver such as left hepatic lobe lateral segment on series 2 image 13 measuring 5 mm, unchanged from previous. Likely benign cysts. No specific imaging follow-up. Pancreas: Severe atrophy of the pancreas with ductal dilatation and calcification or stone within the head of the pancreas on image 21. This could be along the duct. This was seen previously. Spleen: Normal in size without focal abnormality.  Small splenule. Adrenals/Urinary Tract: The adrenal glands are preserved. Slightly malrotated right kidney. There are some tiny low-attenuation foci noted each kidney, under a cm and too small to completely characterize. Bosniak 2 lesions. There are 2 adjacent upper pole cysts as well on the left which are simple. Largest on the prior measured 5.4 cm and today on series 7, image 9 measures 5 cm. Bosniak 1 lesions. Prominent bilateral renal sinus fat. The ureters have normal course and caliber. Preserved contours of the urinary bladder. Slight dependent air in the bladder. Please correlate for any prior instrumentation. Stomach/Bowel: Large bowel has a normal course and caliber. Mild colonic stool. There are several mildly dilated loops of small bowel with luminal air and fluid. This involves loops of jejunum and ileum. There are areas of small bowel stool appearance along the ileum as well. There appears to be a caliber change in the course of the bowel as seen on coronal image 37 of series 4, axial image 54 of series 2 and sagittal series 5, image 59. Developing or partial obstruction is possible. No pneumatosis. No portal venous gas. Stomach is nondilated. Vascular/Lymphatic: Scattered vascular calcifications are identified, moderate distribution and extent. There are areas of stenosis identified such as along the renal arteries, right  common femoral artery. Please correlate with any particular symptoms including claudication or hypertension. No specific abnormal lymph node enlargement identified in the abdomen and pelvis. Reproductive: Status post hysterectomy. No adnexal masses. Other: Anasarca. There is thickening along the anterior abdominal wall. Mild nonspecific mesenteric haziness. No ascites. Musculoskeletal: Streak artifact related to the patient's spinal fixation hardware along the lumbar spine with laminectomy. Moderate multilevel degenerative changes. There is significant disc bulging at levels of the lumbar spine including L5-S1 and L1-2 with stenosis. IMPRESSION: Mildly dilated loops of small bowel with small bowel stool appearance. Transition point in the anterior central pelvis. Partial or developing obstruction is possible. No free air, fluid or pneumatosis. Colonic diverticula. Surgical changes from cholecystectomy and along the lumbar spine. Small hiatal hernia  with wall thickening along the lower esophagus. Please correlate with any particular symptoms. Severe atrophy of the pancreas with ductal dilatation and progressive calcifications along the pancreatic head. Possibly along the course of the main pancreatic duct. Electronically Signed   By: Jill Side M.D.   On: 11/03/2022 16:43      Subjective: Patient seen and examined at bedside.  Hard of hearing.  Complains of some bloating but denies any pain.  Tolerating diet.  No fever or shortness of breath reported.  Discharge Exam: Vitals:   11/12/22 2005 11/13/22 0420  BP: (!) 113/49 (!) 138/56  Pulse: 79 87  Resp: 18 18  Temp: 97.9 F (36.6 C) 98.5 F (36.9 C)  SpO2: 96% 97%    General: Pt is alert, awake, not in acute distress.  Extremely hard of hearing.  On room air.  Chronically ill and deconditioned.   Cardiovascular: rate controlled, S1/S2 + Respiratory: bilateral decreased breath sounds at bases Abdominal: Soft, mildly tender, distended, bowel  sounds + Extremities: no edema, no cyanosis    The results of significant diagnostics from this hospitalization (including imaging, microbiology, ancillary and laboratory) are listed below for reference.     Microbiology: No results found for this or any previous visit (from the past 240 hour(s)).   Labs: BNP (last 3 results) No results for input(s): "BNP" in the last 8760 hours. Basic Metabolic Panel: Recent Labs  Lab 11/07/22 0419 11/08/22 0253 11/09/22 0247 11/10/22 0305 11/11/22 0415  NA 140 143 142 138 137  K 4.2 3.7 3.6 3.7 4.3  CL 114* 115* 113* 104 102  CO2 16* 24 24 26 29   GLUCOSE 153* 208* 198* 196* 153*  BUN 15 17 14 13 15   CREATININE 0.52 0.48 0.40* 0.53 0.51  CALCIUM 8.6* 8.6* 8.0* 7.9* 8.4*  MG 2.3 2.0 1.9 1.8 2.3  PHOS 3.6 1.8* 2.4* 3.6 3.6   Liver Function Tests: Recent Labs  Lab 11/07/22 0419 11/10/22 0305  AST 12* 14*  ALT 12 12  ALKPHOS 69 57  BILITOT 0.9 0.5  PROT 5.6* 4.9*  ALBUMIN 2.8* 2.2*   No results for input(s): "LIPASE", "AMYLASE" in the last 168 hours. No results for input(s): "AMMONIA" in the last 168 hours. CBC: Recent Labs  Lab 11/06/22 1139 11/07/22 0419 11/08/22 0253 11/10/22 0305  WBC 15.7* 12.5* 12.4* 9.3  HGB 13.6 13.9 12.9 11.0*  HCT 43.9 46.6* 40.6 34.4*  MCV 100.5* 102.9* 96.7 95.0  PLT 193 189 211 164   Cardiac Enzymes: No results for input(s): "CKTOTAL", "CKMB", "CKMBINDEX", "TROPONINI" in the last 168 hours. BNP: Invalid input(s): "POCBNP" CBG: Recent Labs  Lab 11/12/22 0548 11/12/22 1212 11/12/22 1607 11/12/22 2132 11/13/22 0718  GLUCAP 178* 160* 127* 166* 153*   D-Dimer No results for input(s): "DDIMER" in the last 72 hours. Hgb A1c No results for input(s): "HGBA1C" in the last 72 hours. Lipid Profile No results for input(s): "CHOL", "HDL", "LDLCALC", "TRIG", "CHOLHDL", "LDLDIRECT" in the last 72 hours. Thyroid function studies No results for input(s): "TSH", "T4TOTAL", "T3FREE", "THYROIDAB"  in the last 72 hours.  Invalid input(s): "FREET3" Anemia work up No results for input(s): "VITAMINB12", "FOLATE", "FERRITIN", "TIBC", "IRON", "RETICCTPCT" in the last 72 hours. Urinalysis    Component Value Date/Time   COLORURINE YELLOW 11/03/2022 1423   APPEARANCEUR CLEAR 11/03/2022 1423   LABSPEC 1.021 11/03/2022 1423   PHURINE 8.0 11/03/2022 1423   GLUCOSEU NEGATIVE 11/03/2022 1423   HGBUR SMALL (A) 11/03/2022 [place] 11/03/2022  1423   KETONESUR 20 (A) 11/03/2022 1423   PROTEINUR NEGATIVE 11/03/2022 1423   NITRITE POSITIVE (A) 11/03/2022 1423   LEUKOCYTESUR SMALL (A) 11/03/2022 1423   Sepsis Labs Recent Labs  Lab 11/06/22 1139 11/07/22 0419 11/08/22 0253 11/10/22 0305  WBC 15.7* 12.5* 12.4* 9.3   Microbiology No results found for this or any previous visit (from the past 240 hour(s)).   Time coordinating discharge: 35 minutes  SIGNED:   Aline August, MD  [facility] 11/13/2022, 8:49 AM

## 2022-11-14 ENCOUNTER — Encounter: Payer: Self-pay | Admitting: *Deleted

## 2022-11-14 NOTE — Telephone Encounter (Signed)
Called and left a voicemail asking for return call to discuss. MyChart message sent to patient advising of change in inhaler.

## 2023-01-07 ENCOUNTER — Telehealth: Payer: Self-pay

## 2023-01-07 MED ORDER — ARNUITY ELLIPTA 100 MCG/ACT IN AEPB: 1.0000 | INHALATION_SPRAY | Freq: Every day | RESPIRATORY_TRACT | 5 refills | Status: DC

## 2023-01-07 NOTE — Telephone Encounter (Signed)
Patient called - DOB/Pharmacy verified - requested medication refill on Arnuity Ellipta 100 mcg/act be sent to [facility].  Patient advised I would send in refill now.  Patient verbalized understanding, no further questions.

## 2023-01-16 ENCOUNTER — Other Ambulatory Visit: Payer: Self-pay | Admitting: Internal Medicine

## 2023-01-16 ENCOUNTER — Telehealth: Payer: Self-pay | Admitting: Internal Medicine

## 2023-01-16 ENCOUNTER — Telehealth: Payer: Self-pay | Admitting: *Deleted

## 2023-01-16 MED ORDER — ASMANEX (60 METERED DOSES) 220 MCG/ACT IN AEPB: 1.0000 | INHALATION_SPRAY | Freq: Every day | RESPIRATORY_TRACT | 2 refills | Status: DC

## 2023-01-16 MED ORDER — ASMANEX (14 METERED DOSES) 220 MCG/ACT IN AEPB: 1.0000 | INHALATION_SPRAY | Freq: Every day | RESPIRATORY_TRACT | 5 refills | Status: DC

## 2023-01-16 NOTE — Telephone Encounter (Signed)
Patient called and stated that while she was in rehab with [facility] the provider there switched her to Arnuity. She states that she doesn't like it and would like to switch back to Asmanex if that is ok. [employer]. Is it ok to switch her back? She now resides at [facility].

## 2023-01-16 NOTE — Addendum Note (Signed)
Addended by: Florence Canner on: 01/16/2023 12:03 PM   Modules accepted: Orders

## 2023-01-16 NOTE — Telephone Encounter (Signed)
Rhonda Watkins from [employer] [phone] called and stated the medication asmanex, she can't get 14 doses don't even know if they make that and need prior authorization as well.

## 2023-01-16 NOTE — Telephone Encounter (Signed)
Patient called back in regards to the same issue. Advised her of the comments from Dr. Allena Katz and let her know that the prescription has been sent into the pharmacy today. Patient stated that she will call the pharmacy about it today.

## 2023-01-16 NOTE — Telephone Encounter (Addendum)
Rhonda Watkins from [employer] [phone] called and stated the medication Asmanex isn't covered under [facility].    Spoke to Rhonda Watkins and she wants something that is covered through her insurance. Rhonda Watkins, Rhonda Watkins, Rhonda Watkins [phone]

## 2023-04-03 ENCOUNTER — Ambulatory Visit: Payer: Medicare Other | Admitting: Podiatry

## 2023-04-07 ENCOUNTER — Ambulatory Visit (INDEPENDENT_AMBULATORY_CARE_PROVIDER_SITE_OTHER): Payer: Medicare Other | Admitting: Podiatry

## 2023-04-07 ENCOUNTER — Encounter: Payer: Self-pay | Admitting: Podiatry

## 2023-04-07 DIAGNOSIS — L84 Corns and callosities: Secondary | ICD-10-CM | POA: Diagnosis not present

## 2023-04-07 DIAGNOSIS — B351 Tinea unguium: Secondary | ICD-10-CM

## 2023-04-07 DIAGNOSIS — M2041 Other hammer toe(s) (acquired), right foot: Secondary | ICD-10-CM | POA: Diagnosis not present

## 2023-04-07 DIAGNOSIS — M79675 Pain in left toe(s): Secondary | ICD-10-CM | POA: Diagnosis not present

## 2023-04-07 DIAGNOSIS — M2042 Other hammer toe(s) (acquired), left foot: Secondary | ICD-10-CM

## 2023-04-07 DIAGNOSIS — M79674 Pain in right toe(s): Secondary | ICD-10-CM

## 2023-04-08 NOTE — Progress Notes (Signed)
Subjective: Chief Complaint  Patient presents with   Callouses    Patient has a corn of 5th left toe   89 year old female presents to the office today with concerns of a pain to the left fifth toe.  No drainage or swelling.  Also nails are thickened elongated she is not able to trim them herself they are causing discomfort.  No open lesions.  Objective: AAO x3, NAD DP/PT pulses palpable bilaterally, CRT less than 3 seconds Digital contractures are present.  Hyperkeratotic lesion of the left dorsal lateral aspect the left fifth toe without any underlying ulceration, drainage or any signs of infection today. Nails are hypertrophic, dystrophic, brittle, discolored, elongated 10. No surrounding redness or drainage. Tenderness nails 1-5 bilaterally. No open lesions or pre-ulcerative lesions are identified today. No pain with calf compression, swelling, warmth, erythema  Assessment: 89 year old female with symptomatic onychosis, hyperkeratotic lesion digit distal deformity.  Plan: -All treatment options discussed with the patient including all alternatives, risks, complications.  -Sharply debrided nails x 10 without any complications or bleeding -Separate debrided hyperkeratotic lesion x 1 without any complications or bleeding as a courtesy.  Dispensed offloading pads.  Discussed shoe modifications avoid excess pressure.  She can keep a small Mehta moisturizer topically on the area but not interdigitally. -Patient encouraged to call the office with any questions, concerns, change in symptoms.   Vivi Barrack DPM

## 2023-05-18 ENCOUNTER — Other Ambulatory Visit: Payer: Self-pay | Admitting: Family

## 2023-05-27 ENCOUNTER — Other Ambulatory Visit: Payer: Self-pay

## 2023-05-27 ENCOUNTER — Ambulatory Visit: Payer: Medicare Other | Admitting: Internal Medicine

## 2023-05-27 ENCOUNTER — Encounter: Payer: Self-pay | Admitting: Internal Medicine

## 2023-05-27 VITALS — BP 122/70 | HR 70 | Temp 97.3°F | Resp 16 | Ht 62.0 in | Wt 127.4 lb

## 2023-05-27 DIAGNOSIS — J4530 Mild persistent asthma, uncomplicated: Secondary | ICD-10-CM

## 2023-05-27 DIAGNOSIS — J310 Chronic rhinitis: Secondary | ICD-10-CM | POA: Diagnosis not present

## 2023-05-27 DIAGNOSIS — K219 Gastro-esophageal reflux disease without esophagitis: Secondary | ICD-10-CM | POA: Diagnosis not present

## 2023-05-27 MED ORDER — FAMOTIDINE 20 MG PO TABS: 20.0000 mg | ORAL_TABLET | Freq: Every day | ORAL | 5 refills | Status: DC | PRN

## 2023-05-27 MED ORDER — FLUTICASONE PROPIONATE 50 MCG/ACT NA SUSP: 2.0000 | Freq: Every day | NASAL | 5 refills | Status: DC | PRN

## 2023-05-27 MED ORDER — ALBUTEROL SULFATE HFA 108 (90 BASE) MCG/ACT IN AERS: 1.0000 | INHALATION_SPRAY | Freq: Four times a day (QID) | RESPIRATORY_TRACT | 1 refills | Status: DC | PRN

## 2023-05-27 MED ORDER — OMEPRAZOLE 20 MG PO CPDR: 20.0000 mg | DELAYED_RELEASE_CAPSULE | Freq: Every day | ORAL | 5 refills | Status: DC | PRN

## 2023-05-27 MED ORDER — ASMANEX (14 METERED DOSES) 220 MCG/ACT IN AEPB: 1.0000 | INHALATION_SPRAY | Freq: Every day | RESPIRATORY_TRACT | 5 refills | Status: DC

## 2023-05-27 NOTE — Progress Notes (Signed)
FOLLOW UP Date of Service/Encounter:  05/27/23   Subjective:  Rhonda Watkins (DOB: March 28, 1932) is a 89 y.o. female who [facility] for follow up for asthma, chronic rhinitis and LPR.   History obtained from: chart review and patient. Last seen on 08/01/2022 and was having episodes where she has to breath more deeply, lasting a few seconds about 1-2x/day.  Did not respond to Albuterol, discussed continuation of ICS at the time.  Also on Prilosec, Pepcid, Flonase.   Asthma Control Test: ACT Total Score: 22.    Doing well since last visit.  Denies much issues with SOB/wheezing. Rarely needs her albuterol.  Taking Asmanex daily.  No ER visits/oral prednisone use since last visit.  Not having much congestion, drainage either. Uses Flonase PRN. Does report some trouble with reflux and stomach gurgling.  Was using Prilosec and Pepcid but had stopped the Pepcid and noticed worsening symptoms.   Past Medical History: Past Medical History:  Diagnosis Date   Arthritis    Asthma    Diabetes mellitus without complication (HCC)    GERD (gastroesophageal reflux disease)    Hyperlipidemia    Hypertension    Mitral regurgitation    Shortness of breath dyspnea    occ   UTI (lower urinary tract infection)    Vitamin D deficiency     Objective:  BP 122/70 (BP Location: Right Arm, Patient Position: Sitting, Cuff Size: Normal)   Pulse 70   Temp (!) 97.3 F (36.3 C) (Temporal)   Resp 16   Ht 5\' 2"  (1.575 m)   Wt 127 lb 6.4 oz (57.8 kg)   SpO2 100%   BMI 23.30 kg/m  Body mass index is 23.3 kg/m. Physical Exam: GEN: alert, well developed HEENT: clear conjunctiva, nose without turbinate hypertrophy, pink nasal mucosa, no rhinorrhea, no cobblestoning HEART: regular rate and rhythm, no murmur LUNGS: clear to auscultation bilaterally, no coughing, unlabored respiration SKIN: no rashes or lesions  Spirometry:  Tracings reviewed. Her effort: Good  reproducible efforts. FVC: 2.85L, 132% FEV1: 1.85L, 115% predicted FEV1/FVC ratio: 65% Interpretation: Spirometry consistent with normal pattern.  Please see scanned spirometry results for details.  Assessment:   1. Chronic rhinitis   2. LPRD (laryngopharyngeal reflux disease)   3. Mild persistent asthma without complication     Plan/Recommendations:  Mild persistent asthma - Well controlled with normal spirometry. MDI teaching done.  - Daily controller medication(s): Asmanex Twisthaler 1 puffs once daily - Rescue medications: Albuterol 1-2 puffs every 4-6 hours if needed for wheezing/shortness of breath - Changes during respiratory infections or worsening symptoms: Increase Asmanex to 2 puffs twice daily for TWO WEEKS. - Asthma control goals:  * Full participation in all desired activities (may need albuterol before activity) * Albuterol use two time or less a week on average (not counting use with activity) * Cough interfering with sleep two time or less a month * Oral steroids no more than once a year * No hospitalizations  LPRD (laryngopharyngeal reflux disease) - Not controlled, discussed adding Pepcid as needed.  - Continue with Prilosec 20mg  daily. - Continue with Famotidine 20mg  nightly as needed.   Chronic Rhinitis  - Controlled.  - Use nasal saline rinses before nose sprays such as with Neilmed Sinus Rinse.  Use distilled water.   - Use Flonase 2 sprays each nostril daily as needed. Aim upward and outward.    Return in about 6 months (around 11/25/2023).  [name]  Allena Katz, MD [facility]

## 2023-05-27 NOTE — Patient Instructions (Addendum)
Mild persistent asthma - Daily controller medication(s): Asmanex Twisthaler 1 puffs once daily - Rescue medications: Albuterol 1-2 puffs every 4-6 hours if needed for wheezing/shortness of breath - Changes during respiratory infections or worsening symptoms: Increase Asmanex to 2 puffs twice daily for TWO WEEKS. - Asthma control goals:  * Full participation in all desired activities (may need albuterol before activity) * Albuterol use two time or less a week on average (not counting use with activity) * Cough interfering with sleep two time or less a month * Oral steroids no more than once a year * No hospitalizations  LPRD (laryngopharyngeal reflux disease) - Continue with Prilosec 20mg  daily. - Continue with Famotidine 20mg  nightly as needed.   Chronic Rhinitis  - Use nasal saline rinses before nose sprays such as with Neilmed Sinus Rinse.  Use distilled water.   - Use Flonase 2 sprays each nostril daily as needed. Aim upward and outward.

## 2023-06-20 ENCOUNTER — Emergency Department (HOSPITAL_COMMUNITY): Payer: Medicare Other

## 2023-06-20 ENCOUNTER — Encounter (HOSPITAL_COMMUNITY): Payer: Self-pay | Admitting: Emergency Medicine

## 2023-06-20 ENCOUNTER — Other Ambulatory Visit: Payer: Self-pay

## 2023-06-20 ENCOUNTER — Emergency Department (HOSPITAL_COMMUNITY)
Admission: EM | Admit: 2023-06-20 | Discharge: 2023-06-20 | Disposition: A | Discharge status: Home / Self Care | Payer: Medicare Other | Attending: Emergency Medicine | Admitting: Emergency Medicine

## 2023-06-20 DIAGNOSIS — R519 Headache, unspecified: Secondary | ICD-10-CM | POA: Diagnosis not present

## 2023-06-20 DIAGNOSIS — Z794 Long term (current) use of insulin: Secondary | ICD-10-CM | POA: Diagnosis not present

## 2023-06-20 DIAGNOSIS — Z79899 Other long term (current) drug therapy: Secondary | ICD-10-CM | POA: Insufficient documentation

## 2023-06-20 DIAGNOSIS — H9202 Otalgia, left ear: Secondary | ICD-10-CM | POA: Diagnosis present

## 2023-06-20 DIAGNOSIS — I10 Essential (primary) hypertension: Secondary | ICD-10-CM | POA: Diagnosis not present

## 2023-06-20 DIAGNOSIS — Z7984 Long term (current) use of oral hypoglycemic drugs: Secondary | ICD-10-CM | POA: Diagnosis not present

## 2023-06-20 DIAGNOSIS — E119 Type 2 diabetes mellitus without complications: Secondary | ICD-10-CM | POA: Insufficient documentation

## 2023-06-20 DIAGNOSIS — H6690 Otitis media, unspecified, unspecified ear: Secondary | ICD-10-CM

## 2023-06-20 LAB — BASIC METABOLIC PANEL
Anion gap: 9 (ref 5–15)
BUN: 25 mg/dL — ABNORMAL HIGH (ref 8–23)
CO2: 24 mmol/L (ref 22–32)
Calcium: 10 mg/dL (ref 8.9–10.3)
Chloride: 104 mmol/L (ref 98–111)
Creatinine, Ser: 0.6 mg/dL (ref 0.44–1.00)
GFR, Estimated: >60 mL/min (ref >60)
Glucose, Bld: 176 mg/dL — ABNORMAL HIGH (ref 70–99)
Potassium: 3.8 mmol/L (ref 3.5–5.1)
Sodium: 137 mmol/L (ref 135–145)

## 2023-06-20 LAB — CBC WITH DIFFERENTIAL/PLATELET
Abs Immature Granulocytes: 0.03 10*3/uL (ref 0.00–0.07)
Basophils Absolute: 0.1 10*3/uL (ref 0.0–0.1)
Basophils Relative: 1 %
Eosinophils Absolute: 0.1 10*3/uL (ref 0.0–0.5)
Eosinophils Relative: 2 %
HCT: 45.3 % (ref 36.0–46.0)
Hemoglobin: 14.4 g/dL (ref 12.0–15.0)
Immature Granulocytes: 0 %
Lymphocytes Relative: 32 %
Lymphs Abs: 2.3 10*3/uL (ref 0.7–4.0)
MCH: 30.2 pg (ref 26.0–34.0)
MCHC: 31.8 g/dL (ref 30.0–36.0)
MCV: 95 fL (ref 80.0–100.0)
Monocytes Absolute: 0.6 10*3/uL (ref 0.1–1.0)
Monocytes Relative: 8 %
Neutro Abs: 4.2 10*3/uL (ref 1.7–7.7)
Neutrophils Relative %: 57 %
Platelets: 209 10*3/uL (ref 150–400)
RBC: 4.77 MIL/uL (ref 3.87–5.11)
RDW: 13.3 % (ref 11.5–15.5)
WBC: 7.3 10*3/uL (ref 4.0–10.5)
nRBC: 0 % (ref 0.0–0.2)

## 2023-06-20 LAB — BRAIN NATRIURETIC PEPTIDE: B Natriuretic Peptide: 120.4 pg/mL — ABNORMAL HIGH (ref 0.0–100.0)

## 2023-06-20 MED ORDER — AMOXICILLIN-POT CLAVULANATE 875-125 MG PO TABS: 1.0000 | ORAL_TABLET | Freq: Two times a day (BID) | ORAL | 0 refills | Status: DC

## 2023-06-20 MED ORDER — IOHEXOL 300 MG/ML  SOLN
75.0000 mL | Freq: Once | INTRAMUSCULAR | Status: AC | PRN
  Administered 2023-06-20: 75 mL via INTRAVENOUS

## 2023-06-20 MED ORDER — CEFTRIAXONE SODIUM 1 G IJ SOLR
1.0000 g | Freq: Once | INTRAMUSCULAR | Status: AC
  Administered 2023-06-20: 1 g via INTRAMUSCULAR
  Filled 2023-06-20: qty 10

## 2023-06-20 NOTE — ED Triage Notes (Signed)
Patient arrives ambulatory by POV with rolling walker c/o left ear pain and pain behind it x 2 weeks. States she was seen by a PA and given a cream that did not help but then followed up with her own PCP and started on amoxicillin. Patient points to head stating that is where the pain is. Denies any injury.

## 2023-06-20 NOTE — ED Notes (Signed)
Patient Alert and oriented to baseline. Stable and ambulatory to baseline. Patient verbalized understanding of the discharge instructions.  Patient belongings were taken by the patient.   

## 2023-06-20 NOTE — Discharge Instructions (Addendum)
Today you were seen for left ear pain.  Please pick up your antibiotic and take as directed.  Please discontinue your current antibiotic.  You may continue taking Tylenol as needed for pain.  Please follow-up with your primary care physician in approximately 1 week.  Thank you for letting us treat you today. After reviewing your labs and imaging, I feel you are safe to go home. Please follow up with your PCP in the next several days and provide them with your records from this visit. Return to the Emergency Room if pain becomes severe or symptoms worsen.

## 2023-06-20 NOTE — ED Provider Notes (Signed)
[place] [facility] AT [facility] Provider Note   CSN: [id number] Arrival date & time: 06/20/23  4098     History  Chief Complaint  Patient presents with   Headache    Rhonda Watkins is a 89 y.o. female past medical history significant for hypertension, GERD, diabetes, hypercholesterolemia presents today for left ear pain x 2 weeks patient states she was seen by a PA and given an antibiotic cream but did not help and was then seen by her PCP who started her on Augmentin on 11/7 and has had no improvement.  Patient does have hearing loss in this ear.  Patient endorses pain around the ear and feeling like the inside is swollen.  She denies any radiation of the pain.  She has tried taking meloxicam which has not helped.  She denies fever, body aches, ear discharge, upper respiratory infections, nausea, vomiting, diarrhea, dizziness or pain anywhere else.  She has had enlarged cervical lymph node on this side as well which she says her PCP imaged recently   Headache Associated symptoms: ear pain        Home Medications Prior to Admission medications   Medication Sig Start Date End Date Taking? Authorizing Provider  acetaminophen (TYLENOL) 325 MG tablet Take 2 tablets (650 mg total) by mouth every 6 (six) hours. 11/13/22   Glade Lloyd, MD  albuterol (VENTOLIN HFA) 108 (90 Base) MCG/ACT inhaler Inhale 1-2 puffs into the lungs every 6 (six) hours as needed for wheezing or shortness of breath. 05/27/23   Birder Robson, MD  atorvastatin (LIPITOR) 10 MG tablet Take 10 mg by mouth daily. 12/27/17   [provider]  chlorhexidine (PERIDEX) 0.12 % solution Use as directed 15 mLs in the mouth or throat 2 (two) times daily as needed (as directed).    [provider]  Cholecalciferol (VITAMIN D3) 75 MCG (3000 UT) TABS Take 3,000 Units by mouth daily.    [provider]  famotidine (PEPCID) 20 MG tablet Take 1 tablet (20 mg total) by mouth daily as  needed for heartburn or indigestion. 05/27/23   Birder Robson, MD  fluticasone (FLONASE) 50 MCG/ACT nasal spray Place 2 sprays into both nostrils daily as needed for allergies or rhinitis. 05/27/23   Birder Robson, MD  Fluticasone Furoate (ARNUITY ELLIPTA) 100 MCG/ACT AEPB Inhale 1 puff into the lungs daily. 01/07/23   Birder Robson, MD  furosemide (LASIX) 40 MG tablet Take 40 mg by mouth daily. 08/31/16   [provider]  glucose blood (ONETOUCH VERIO) test strip CHECK BLOOD SUGAR TWICE DAILY AS DIRECTED.    [provider]  guaiFENesin (MUCINEX) 600 MG 12 hr tablet Take 600 mg by mouth 2 (two) times daily as needed for cough or to loosen phlegm.    [provider]  JARDIANCE 25 MG TABS tablet Take 25 mg by mouth daily.    [provider]  Lancets MISC twice a day as directed 03/16/20   [provider]  LANTUS SOLOSTAR 100 UNIT/ML Solostar Pen Inject 12 Units into the skin at bedtime.    [provider]  mometasone (ASMANEX, 14 METERED DOSES,) 220 MCG/ACT inhaler Inhale 1 puff into the lungs daily. 05/27/23   Birder Robson, MD  omeprazole (PRILOSEC) 20 MG capsule Take 1 capsule (20 mg total) by mouth daily as needed (for heartburn). 05/27/23   Birder Robson, MD  polyethylene glycol (MIRALAX / GLYCOLAX) 17 g packet Take 17 g  by mouth 2 (two) times daily. 11/13/22   Glade Lloyd, MD  senna (SENOKOT) 8.6 MG TABS tablet Take 2 tablets (17.2 mg total) by mouth daily. 11/13/22   Glade Lloyd, MD  SURE COMFORT PEN NEEDLES 31G X 8 MM MISC SMARTSIG:Injection 11/21/20   [provider]  Vitamins-Lipotropics (LIPO FLAVONOID PLUS) TABS Take 1 tablet by mouth 2 (two) times daily.    [provider]      Allergies    Jardiance [empagliflozin], Linagliptin, and Metformin and related    Review of Systems   Review of Systems  HENT:  Positive for ear pain.   Neurological:  Positive for headaches.    Physical Exam Updated Vital  Signs BP (!) 160/62 (BP Location: Left Arm)   Pulse 84   Temp 97.6 F (36.4 C) (Oral)   Resp 16   Ht 5\' 2"  (1.575 m)   Wt 57.6 kg   SpO2 100%   BMI 23.23 kg/m  Physical Exam Vitals and nursing note reviewed.  Constitutional:      General: She is not in acute distress.    Appearance: She is well-developed.  HENT:     Head: Normocephalic and atraumatic.     Right Ear: Tympanic membrane normal.     Left Ear: There is mastoid tenderness.     Ears:     Comments: Mild swelling to the left ear canal without erythema    Mouth/Throat:     Mouth: Mucous membranes are moist.     Pharynx: Uvula midline.  Eyes:     Conjunctiva/sclera: Conjunctivae normal.     Pupils: Pupils are equal, round, and reactive to light.  Neck:     Trachea: Phonation normal.  Cardiovascular:     Rate and Rhythm: Normal rate and regular rhythm.     Heart sounds: No murmur heard. Pulmonary:     Effort: Pulmonary effort is normal. No respiratory distress.     Breath sounds: Normal breath sounds.  Abdominal:     Palpations: Abdomen is soft.     Tenderness: There is no abdominal tenderness.  Musculoskeletal:        General: No swelling.     Cervical back: Normal range of motion and neck supple. No torticollis.  Lymphadenopathy:     Cervical: Cervical adenopathy present.     Left cervical: Superficial cervical adenopathy present.  Skin:    General: Skin is warm and dry.     Capillary Refill: Capillary refill takes less than 2 seconds.  Neurological:     Mental Status: She is alert.     GCS: GCS eye subscore is 4. GCS verbal subscore is 5. GCS motor subscore is 6.  Psychiatric:        Mood and Affect: Mood normal.     ED Results / Procedures / Treatments   Labs (all labs ordered are listed, but only abnormal results are displayed) Labs Reviewed  CBC WITH DIFFERENTIAL/PLATELET  BRAIN NATRIURETIC PEPTIDE    EKG None  Radiology No results found.  Procedures Procedures    Medications  Ordered in ED Medications - No data to display  ED Course/ Medical Decision Making/ A&P                                 Medical Decision Making Amount and/or Complexity of Data Reviewed Labs: ordered.   This patient presents to the ED with chief complaint(s) of left ear  pain/headache with pertinent past medical history of diabetes which further complicates the presenting complaint. The complaint involves an extensive differential diagnosis and also carries with it a high risk of complications and morbidity.    The differential diagnosis includes otitis media, otitis externa, mastoiditis, malignant otitis externa  Additional history obtained: Records reviewed Primary Care Documents  ED Course and Reassessment: Patient given 1 g Rocephin IM  Independent labs interpretation:  The following labs were independently interpreted:  CBC: No notable findings CMP: Mildly elevated bun  Independent visualization of imaging: - I independently visualized the following imaging with scope of interpretation limited to determining acute life threatening conditions related to emergency care: CT temporal bones with contrast, which revealed Trace right mastoid effusion. Otherwise normal appearance of the temporal bilaterally  Consultation: - Consulted or discussed management/test interpretation w/ external professional: None  Consideration for admission or further workup: Considered mission for further workup however patient did not stable throughout ER stay.  Patient's physical exam, labs, imaging have all been reassuring.  Patient feels safe for discharge and follow-up with PCP.         Final Clinical Impression(s) / ED Diagnoses Final diagnoses:  None    Rx / DC Orders ED Discharge Orders     None         Gretta Began 06/20/23 1616    Rondel Baton, MD 06/24/23 1359

## 2023-07-06 ENCOUNTER — Emergency Department (HOSPITAL_BASED_OUTPATIENT_CLINIC_OR_DEPARTMENT_OTHER): Payer: Medicare Other

## 2023-07-06 ENCOUNTER — Other Ambulatory Visit: Payer: Self-pay

## 2023-07-06 ENCOUNTER — Encounter (HOSPITAL_COMMUNITY): Payer: Self-pay

## 2023-07-06 ENCOUNTER — Emergency Department (HOSPITAL_COMMUNITY)
Admission: EM | Admit: 2023-07-06 | Discharge: 2023-07-06 | Disposition: A | Discharge status: Home / Self Care | Payer: Medicare Other | Attending: Emergency Medicine | Admitting: Emergency Medicine

## 2023-07-06 DIAGNOSIS — M7121 Synovial cyst of popliteal space [Baker], right knee: Secondary | ICD-10-CM | POA: Insufficient documentation

## 2023-07-06 DIAGNOSIS — M79604 Pain in right leg: Secondary | ICD-10-CM | POA: Diagnosis present

## 2023-07-06 DIAGNOSIS — M79661 Pain in right lower leg: Secondary | ICD-10-CM

## 2023-07-06 LAB — CBC WITH DIFFERENTIAL/PLATELET
Abs Immature Granulocytes: 0.03 10*3/uL (ref 0.00–0.07)
Basophils Absolute: 0.1 10*3/uL (ref 0.0–0.1)
Basophils Relative: 1 %
Eosinophils Absolute: 0.2 10*3/uL (ref 0.0–0.5)
Eosinophils Relative: 2 %
HCT: 42 % (ref 36.0–46.0)
Hemoglobin: 14 g/dL (ref 12.0–15.0)
Immature Granulocytes: 0 %
Lymphocytes Relative: 23 %
Lymphs Abs: 1.7 10*3/uL (ref 0.7–4.0)
MCH: 31.3 pg (ref 26.0–34.0)
MCHC: 33.3 g/dL (ref 30.0–36.0)
MCV: 94 fL (ref 80.0–100.0)
Monocytes Absolute: 0.6 10*3/uL (ref 0.1–1.0)
Monocytes Relative: 9 %
Neutro Abs: 4.8 10*3/uL (ref 1.7–7.7)
Neutrophils Relative %: 65 %
Platelets: 184 10*3/uL (ref 150–400)
RBC: 4.47 MIL/uL (ref 3.87–5.11)
RDW: 13.5 % (ref 11.5–15.5)
WBC: 7.3 10*3/uL (ref 4.0–10.5)
nRBC: 0 % (ref 0.0–0.2)

## 2023-07-06 LAB — BASIC METABOLIC PANEL
Anion gap: 9 (ref 5–15)
BUN: 20 mg/dL (ref 8–23)
CO2: 23 mmol/L (ref 22–32)
Calcium: 9.5 mg/dL (ref 8.9–10.3)
Chloride: 106 mmol/L (ref 98–111)
Creatinine, Ser: 0.62 mg/dL (ref 0.44–1.00)
GFR, Estimated: >60 mL/min (ref >60)
Glucose, Bld: 118 mg/dL — ABNORMAL HIGH (ref 70–99)
Potassium: 3.2 mmol/L — ABNORMAL LOW (ref 3.5–5.1)
Sodium: 138 mmol/L (ref 135–145)

## 2023-07-06 MED ORDER — HYDROCODONE-ACETAMINOPHEN 5-325 MG PO TABS
1.0000 | ORAL_TABLET | Freq: Once | ORAL | Status: AC
Start: 2023-07-06 — End: 2023-07-06
  Administered 2023-07-06: 1 via ORAL
  Filled 2023-07-06: qty 1

## 2023-07-06 MED ORDER — CEPHALEXIN 500 MG PO CAPS: 500.0000 mg | ORAL_CAPSULE | Freq: Four times a day (QID) | ORAL | 0 refills | Status: DC

## 2023-07-06 MED ORDER — ACETAMINOPHEN ER 650 MG PO TBCR: 650.0000 mg | EXTENDED_RELEASE_TABLET | Freq: Four times a day (QID) | ORAL | 0 refills | Status: DC | PRN

## 2023-07-06 MED ORDER — NAPROXEN 375 MG PO TABS: 375.0000 mg | ORAL_TABLET | Freq: Two times a day (BID) | ORAL | 0 refills | Status: DC

## 2023-07-06 NOTE — Discharge Instructions (Addendum)
You are seen in the ER for leg swelling and pain. The ultrasound does not reveal any evidence of clots.  We do see evidence of Baker's cyst.  It is possible that you might have inflammation from the Baker's cyst and possibly infection. Therefore we recommend that you apply ice to the back of your knee every 2-4 hours for 15 minutes. Additionally take the medications that are prescribed for pain, inflammation and infection.  We recommend that you follow-up with your primary care doctor in about 7 days.  Please call them to set up an appointment for next week.  Return to the emergency room if you start having worsening of your symptoms.

## 2023-07-06 NOTE — ED Triage Notes (Signed)
Pt arrived POV from [facility] with a neighbor d/t right leg pain.  Pt states she Urgent Care on Saturday and was told she had a Baker's Cyst.  Pain medicine not working and is worse - now it is the whole leg and includes her buttocks.  Pt states she took 1000 mg PO Tylenol at 2130 last night.  Pt has not taken anything today.

## 2023-07-06 NOTE — Progress Notes (Signed)
Right lower extremity venous duplex has been completed. Preliminary results can be found in CV Proc through chart review.  Results were given to Dr. Rhunette Croft.  07/06/23 9:30 AM Olen Cordial RVT

## 2023-07-06 NOTE — ED Provider Notes (Signed)
[place] EMERGENCY DEPARTMENT AT [facility] Provider Note   CSN: [id number] Arrival date & time: 07/06/23  0549     History  Chief Complaint  Patient presents with   Leg Pain    Rhonda Watkins is a 89 y.o. female.  HPI     89 year old female comes in with chief complaint of leg pain.  Patient has having discomfort over her right leg since Thursday.  Patient had gone to urgent care, she was clinically diagnosed with Baker's cyst.  She was put on Tylenol and Mobic and has not seen any improvement.  She appreciates increasing swelling in the leg.  Pain is primarily over the right thigh area.  Patient denies any abdominal pain, UTI-like symptoms.  She denies any nausea, vomiting, fevers, but does feel little cold.  Home Medications Prior to Admission medications   Medication Sig Start Date End Date Taking? Authorizing Provider  acetaminophen (TYLENOL 8 HOUR) 650 MG CR tablet Take 1 tablet (650 mg total) by mouth every 6 (six) hours as needed for pain or fever. 07/06/23  Yes Kc Summerson, MD  cephALEXin (KEFLEX) 500 MG capsule Take 1 capsule (500 mg total) by mouth 4 (four) times daily. 07/06/23  Yes Derwood Kaplan, MD  naproxen (NAPROSYN) 375 MG tablet Take 1 tablet (375 mg total) by mouth 2 (two) times daily with a meal. 07/06/23  Yes Brinlyn Cena, MD  albuterol (VENTOLIN HFA) 108 (90 Base) MCG/ACT inhaler Inhale 1-2 puffs into the lungs every 6 (six) hours as needed for wheezing or shortness of breath. 05/27/23   Birder Robson, MD  atorvastatin (LIPITOR) 10 MG tablet Take 10 mg by mouth daily. 12/27/17   [provider]  chlorhexidine (PERIDEX) 0.12 % solution Use as directed 15 mLs in the mouth or throat 2 (two) times daily as needed (as directed).    [provider]  Cholecalciferol (VITAMIN D3) 75 MCG (3000 UT) TABS Take 3,000 Units by mouth daily.    [provider]  famotidine (PEPCID) 20 MG tablet Take 1 tablet (20 mg total) by  mouth daily as needed for heartburn or indigestion. 05/27/23   Birder Robson, MD  fluticasone (FLONASE) 50 MCG/ACT nasal spray Place 2 sprays into both nostrils daily as needed for allergies or rhinitis. 05/27/23   Birder Robson, MD  Fluticasone Furoate (ARNUITY ELLIPTA) 100 MCG/ACT AEPB Inhale 1 puff into the lungs daily. 01/07/23   Birder Robson, MD  furosemide (LASIX) 40 MG tablet Take 40 mg by mouth daily. 08/31/16   [provider]  glucose blood (ONETOUCH VERIO) test strip CHECK BLOOD SUGAR TWICE DAILY AS DIRECTED.    [provider]  guaiFENesin (MUCINEX) 600 MG 12 hr tablet Take 600 mg by mouth 2 (two) times daily as needed for cough or to loosen phlegm.    [provider]  JARDIANCE 25 MG TABS tablet Take 25 mg by mouth daily.    [provider]  Lancets MISC twice a day as directed 03/16/20   [provider]  LANTUS SOLOSTAR 100 UNIT/ML Solostar Pen Inject 12 Units into the skin at bedtime.    [provider]  mometasone (ASMANEX, 14 METERED DOSES,) 220 MCG/ACT inhaler Inhale 1 puff into the lungs daily. 05/27/23   Birder Robson, MD  omeprazole (PRILOSEC) 20 MG capsule Take 1 capsule (20 mg total) by mouth daily as needed (for heartburn). 05/27/23   Birder Robson, MD  polyethylene glycol (MIRALAX / Ethelene Hal)  17 g packet Take 17 g by mouth 2 (two) times daily. 11/13/22   Glade Lloyd, MD  senna (SENOKOT) 8.6 MG TABS tablet Take 2 tablets (17.2 mg total) by mouth daily. 11/13/22   Glade Lloyd, MD  SURE COMFORT PEN NEEDLES 31G X 8 MM MISC SMARTSIG:Injection 11/21/20   [provider]  Vitamins-Lipotropics (LIPO FLAVONOID PLUS) TABS Take 1 tablet by mouth 2 (two) times daily.    [provider]      Allergies    Jardiance [empagliflozin], Linagliptin, and Metformin and related    Review of Systems   Review of Systems  All other systems reviewed and are negative.   Physical Exam Updated Vital Signs BP (!) 174/81  (BP Location: Left Arm)   Pulse 67   Temp 97.8 F (36.6 C) (Oral)   Resp 15   Ht 5\' 2"  (1.575 m)   Wt 56.2 kg   SpO2 99%   BMI 22.68 kg/m  Physical Exam Vitals and nursing note reviewed.  Constitutional:      Appearance: She is well-developed.  HENT:     Head: Atraumatic.  Cardiovascular:     Rate and Rhythm: Normal rate.  Pulmonary:     Effort: Pulmonary effort is normal.  Musculoskeletal:        General: Swelling and tenderness present.     Cervical back: Normal range of motion and neck supple.     Comments: Right lower extremity appears slightly more edematous compared to left.  Patient has warmth to touch, and there is subtle redness as well.  Skin:    General: Skin is warm and dry.  Neurological:     Mental Status: She is alert and oriented to person, place, and time.     ED Results / Procedures / Treatments   Labs (all labs ordered are listed, but only abnormal results are displayed) Labs Reviewed  BASIC METABOLIC PANEL - Abnormal; Notable for the following components:      Result Value   Potassium 3.2 (*)    Glucose, Bld 118 (*)    All other components within normal limits  CBC WITH DIFFERENTIAL/PLATELET    EKG None  Radiology VAS [place] LOWER EXTREMITY VENOUS (DVT) (7a-7p)  Result Date: 07/06/2023  Lower Venous DVT Study Patient Name:  Rhonda Watkins  Date of Exam:   07/06/2023 Medical Rec #: [id number]       Accession #:    [id number] Date of Birth: 04-21-1932       Patient Gender: F Patient Age:   8 years Exam Location:  [facility] Procedure:      VAS [place] LOWER EXTREMITY VENOUS (DVT) Referring Phys: Janey Genta Mayes Sangiovanni --------------------------------------------------------------------------------  Indications: Pain.  Risk Factors: None identified. Limitations: Poor ultrasound/tissue interface. Comparison Study: No prior studies. Performing Technologist: Chanda Busing RVT  Examination Guidelines: A complete evaluation includes B-mode imaging, spectral  Doppler, color Doppler, and power Doppler as needed of all accessible portions of each vessel. Bilateral testing is considered an integral part of a complete examination. Limited examinations for reoccurring indications may be performed as noted. The reflux portion of the exam is performed with the patient in reverse Trendelenburg.  +---------+---------------+---------+-----------+----------+--------------+ RIGHT    CompressibilityPhasicitySpontaneityPropertiesThrombus Aging +---------+---------------+---------+-----------+----------+--------------+ CFV      Full           Yes      Yes                                 +---------+---------------+---------+-----------+----------+--------------+  SFJ      Full                                                        +---------+---------------+---------+-----------+----------+--------------+ FV Prox  Full                                                        +---------+---------------+---------+-----------+----------+--------------+ FV Mid   Full                                                        +---------+---------------+---------+-----------+----------+--------------+ FV DistalFull                                                        +---------+---------------+---------+-----------+----------+--------------+ PFV      Full                                                        +---------+---------------+---------+-----------+----------+--------------+ POP      Full           Yes      Yes                                 +---------+---------------+---------+-----------+----------+--------------+ PTV      Full                                                        +---------+---------------+---------+-----------+----------+--------------+ PERO     Full                                                        +---------+---------------+---------+-----------+----------+--------------+    +----+---------------+---------+-----------+----------+--------------+ LEFTCompressibilityPhasicitySpontaneityPropertiesThrombus Aging +----+---------------+---------+-----------+----------+--------------+ CFV Full           Yes      Yes                                 +----+---------------+---------+-----------+----------+--------------+    Summary: RIGHT: - There is no evidence of deep vein thrombosis in the lower extremity.  - A cystic structure is found in the popliteal fossa.  LEFT: - No evidence of common femoral vein obstruction.   *See table(s) above for measurements and observations.    Preliminary  Procedures Procedures    Medications Ordered in ED Medications  HYDROcodone-acetaminophen (NORCO/VICODIN) 5-325 MG per tablet 1 tablet (1 tablet Oral Given 07/06/23 0741)    ED Course/ Medical Decision Making/ A&P                                 Medical Decision Making Amount and/or Complexity of Data Reviewed Labs: ordered.  Risk OTC drugs. Prescription drug management.   This patient presents to the ED with chief complaint(s) of right leg swelling and discomfort for the last 4 to 5 days, worsening with pertinent past medical history of hypertension, hyperlipidemia and a recent visit at the urgent care with a diagnosis of Baker's cyst.The complaint involves an extensive differential diagnosis and also carries with it a high risk of complications and morbidity.    The differential diagnosis includes : Acute DVT, cellulitis, infected Baker's cyst, superficial thrombophlebitis.  The initial plan is to get ultrasound DVT.  Patient did have blood work done on June 20, 2023 and her blood work was reassuring at that time from renal function perspective.   Additional history obtained: Additional history obtained from  neighbor Records reviewed  previous labs  Treatment and Reassessment: Patient reassessed.  Results of the ED workup discussed with her.  Ultrasound  is negative for DVT.  There is Baker's cyst.  I explained to her that she could have inflammation because of Baker's cyst right now.  RICE advised.  I also discussed with her that there is a possibility that there could be an infection given the redness, warmth to touch or that the findings are simply reflection of inflammation.  Patient would prefer more aggressive option of being placed on antibiotics.  Will discharge her with NSAIDs, Tylenol and Keflex.  I have advised that she follow-up with her PCP in 1 week.   Final Clinical Impression(s) / ED Diagnoses Final diagnoses:  Synovial cyst of right knee    Rx / DC Orders ED Discharge Orders          Ordered    cephALEXin (KEFLEX) 500 MG capsule  4 times daily        07/06/23 1033    naproxen (NAPROSYN) 375 MG tablet  2 times daily with meals        07/06/23 1033    acetaminophen (TYLENOL 8 HOUR) 650 MG CR tablet  Every 6 hours PRN        07/06/23 1033              Derwood Kaplan, MD 07/06/23 1108

## 2023-07-11 ENCOUNTER — Encounter (HOSPITAL_COMMUNITY): Payer: Self-pay

## 2023-07-11 ENCOUNTER — Other Ambulatory Visit: Payer: Self-pay

## 2023-07-11 ENCOUNTER — Emergency Department (HOSPITAL_COMMUNITY)
Admission: EM | Admit: 2023-07-11 | Discharge: 2023-07-11 | Disposition: A | Discharge status: Home / Self Care | Payer: Medicare Other | Attending: Emergency Medicine | Admitting: Emergency Medicine

## 2023-07-11 DIAGNOSIS — Z794 Long term (current) use of insulin: Secondary | ICD-10-CM | POA: Insufficient documentation

## 2023-07-11 DIAGNOSIS — I8393 Asymptomatic varicose veins of bilateral lower extremities: Secondary | ICD-10-CM | POA: Insufficient documentation

## 2023-07-11 DIAGNOSIS — M5431 Sciatica, right side: Secondary | ICD-10-CM | POA: Insufficient documentation

## 2023-07-11 DIAGNOSIS — M79604 Pain in right leg: Secondary | ICD-10-CM | POA: Diagnosis present

## 2023-07-11 MED ORDER — HYDROCODONE-ACETAMINOPHEN 5-325 MG PO TABS: 0.5000 | ORAL_TABLET | Freq: Four times a day (QID) | ORAL | 0 refills | Status: DC | PRN

## 2023-07-11 MED ORDER — HYDROCODONE-ACETAMINOPHEN 5-325 MG PO TABS
0.5000 | ORAL_TABLET | Freq: Once | ORAL | Status: AC
  Administered 2023-07-11: 0.5 via ORAL
  Filled 2023-07-11: qty 1

## 2023-07-11 NOTE — Discharge Instructions (Signed)
When taking the pain medication, called Norco, take 1/2 tablet every 6 hours. If this is not controlling your pain, you can increase to 1 whole tablet every 6 hours. I recommend having someone in the home with you if you need to increase the medication to make sure you tolerate it well.  You can continue Naproxen as prescribed.   You can take Tylenol but limit this to 500 mg (one extra-strength tablet) every 4 hours as the new medication has a small amount of Tylenol with it.

## 2023-07-11 NOTE — ED Provider Notes (Signed)
[place] EMERGENCY DEPARTMENT AT [facility] Provider Note   CSN: [id number] Arrival date & time: 07/11/23  1914     History  Chief Complaint  Patient presents with   Leg Pain    Rhonda Watkins is a 89 y.o. female.  Patient returns to ED with further complaints of right leg pain. Seen at Urgent Care 11/23 and given Mobic and Tylenol that gave no relief. She came to ED 11/25 for uncontrolled pain. At that time she had a venous doppler of the right leg that was negative for DVT, positive for Baker's cyst. She was started on Keflex for possible early infection given redness and slight warmth on evaluation, and through shared decision making. She returns to day with continuation of uncontrolled pain reporting the pain is now at the buttock and lateral hip, radiating distally to lateral knee.   The history is provided by the patient. No language interpreter was used.  Leg Pain      Home Medications Prior to Admission medications   Medication Sig Start Date End Date Taking? Authorizing Provider  HYDROcodone-acetaminophen (NORCO/VICODIN) 5-325 MG tablet Take 0.5-1 tablets by mouth every 6 (six) hours as needed. 07/11/23  Yes Ziasia Lenoir, Melvenia Beam, PA-C  acetaminophen (TYLENOL 8 HOUR) 650 MG CR tablet Take 1 tablet (650 mg total) by mouth every 6 (six) hours as needed for pain or fever. 07/06/23   Derwood Kaplan, MD  albuterol (VENTOLIN HFA) 108 (90 Base) MCG/ACT inhaler Inhale 1-2 puffs into the lungs every 6 (six) hours as needed for wheezing or shortness of breath. 05/27/23   Birder Robson, MD  atorvastatin (LIPITOR) 10 MG tablet Take 10 mg by mouth daily. 12/27/17   [provider]  cephALEXin (KEFLEX) 500 MG capsule Take 1 capsule (500 mg total) by mouth 4 (four) times daily. 07/06/23   Derwood Kaplan, MD  chlorhexidine (PERIDEX) 0.12 % solution Use as directed 15 mLs in the mouth or throat 2 (two) times daily as needed (as directed).    [provider]   Cholecalciferol (VITAMIN D3) 75 MCG (3000 UT) TABS Take 3,000 Units by mouth daily.    [provider]  famotidine (PEPCID) 20 MG tablet Take 1 tablet (20 mg total) by mouth daily as needed for heartburn or indigestion. 05/27/23   Birder Robson, MD  fluticasone (FLONASE) 50 MCG/ACT nasal spray Place 2 sprays into both nostrils daily as needed for allergies or rhinitis. 05/27/23   Birder Robson, MD  Fluticasone Furoate (ARNUITY ELLIPTA) 100 MCG/ACT AEPB Inhale 1 puff into the lungs daily. 01/07/23   Birder Robson, MD  furosemide (LASIX) 40 MG tablet Take 40 mg by mouth daily. 08/31/16   [provider]  glucose blood (ONETOUCH VERIO) test strip CHECK BLOOD SUGAR TWICE DAILY AS DIRECTED.    [provider]  guaiFENesin (MUCINEX) 600 MG 12 hr tablet Take 600 mg by mouth 2 (two) times daily as needed for cough or to loosen phlegm.    [provider]  JARDIANCE 25 MG TABS tablet Take 25 mg by mouth daily.    [provider]  Lancets MISC twice a day as directed 03/16/20   [provider]  LANTUS SOLOSTAR 100 UNIT/ML Solostar Pen Inject 12 Units into the skin at bedtime.    [provider]  mometasone (ASMANEX, 14 METERED DOSES,) 220 MCG/ACT inhaler Inhale 1 puff into the lungs daily. 05/27/23   Birder Robson, MD  naproxen (NAPROSYN) 375 MG tablet  Take 1 tablet (375 mg total) by mouth 2 (two) times daily with a meal. 07/06/23   Derwood Kaplan, MD  omeprazole (PRILOSEC) 20 MG capsule Take 1 capsule (20 mg total) by mouth daily as needed (for heartburn). 05/27/23   Birder Robson, MD  polyethylene glycol (MIRALAX / GLYCOLAX) 17 g packet Take 17 g by mouth 2 (two) times daily. 11/13/22   Glade Lloyd, MD  senna (SENOKOT) 8.6 MG TABS tablet Take 2 tablets (17.2 mg total) by mouth daily. 11/13/22   Glade Lloyd, MD  SURE COMFORT PEN NEEDLES 31G X 8 MM MISC SMARTSIG:Injection 11/21/20   [provider]  Vitamins-Lipotropics (LIPO FLAVONOID  PLUS) TABS Take 1 tablet by mouth 2 (two) times daily.    [provider]      Allergies    Jardiance [empagliflozin], Linagliptin, and Metformin and related    Review of Systems   Review of Systems  Physical Exam Updated Vital Signs BP (!) 140/77 (BP Location: Right Arm)   Pulse 75   Temp (!) 97.5 F (36.4 C) (Oral)   Resp 16   Ht 5\' 2"  (1.575 m)   Wt 56.2 kg   SpO2 100%   BMI 22.68 kg/m  Physical Exam Vitals and nursing note reviewed.  Constitutional:      Appearance: Normal appearance.  Cardiovascular:     Rate and Rhythm: Normal rate.  Pulmonary:     Effort: Pulmonary effort is normal.  Musculoskeletal:        General: Normal range of motion.     Comments: Multiple, bilateral varicosities. No redness or asymmetric swelling of lower extremities. Fullness in posterior right knee, nontender. There is tenderness to palpation over proximal lateral thigh and right buttock, c/w sciatica. No midline lumbar tenderness.   Skin:    General: Skin is warm and dry.  Neurological:     Mental Status: She is alert.     ED Results / Procedures / Treatments   Labs (all labs ordered are listed, but only abnormal results are displayed) Labs Reviewed - No data to display  EKG None  Radiology No results found.  Procedures Procedures    Medications Ordered in ED Medications  HYDROcodone-acetaminophen (NORCO/VICODIN) 5-325 MG per tablet 0.5 tablet (0.5 tablets Oral Given 07/11/23 0954)    ED Course/ Medical Decision Making/ A&P Clinical Course as of 07/11/23 1112  Sat Jul 11, 2023  8841 Suspect neuropathic pain in right leg. 1/2 tablet Norco provided for pain. Patient requests MD involvement. Being seen by Dr. Leanna Sato as well.  [SU]  1110 Patient seen and evaluated by Dr. Wallace Cullens who agrees with assessment and plan of care. Patient will be instructed to take 1/2 Norco at home q 6 hours. If this does not control pain, she can increase to 1 tablet but is told to have a  family member present if she does this to ensure she tolerates it well and remains able to function independently and safely. She has a scheduled appointment Monday with PCP.  [SU]    Clinical Course User Index [SU] Elpidio Anis, PA-C                                 Medical Decision Making Risk Prescription drug management.           Final Clinical Impression(s) / ED Diagnoses Final diagnoses:  Sciatica of right side    Rx / DC Orders  ED Discharge Orders          Ordered    HYDROcodone-acetaminophen (NORCO/VICODIN) 5-325 MG tablet  Every 6 hours PRN        07/11/23 1033              Elpidio Anis, PA-C 07/11/23 1112    Edwin Dada P, DO 07/17/23 2349

## 2023-07-11 NOTE — ED Triage Notes (Signed)
Patient has been seen multiple times for right leg pain. Dx with baker's cyst. States medications are not helping. Continues to have increased pain that now radiates to her buttocks.

## 2023-07-13 ENCOUNTER — Ambulatory Visit
Admission: RE | Admit: 2023-07-13 | Discharge: 2023-07-13 | Disposition: A | Discharge status: Home / Self Care | Payer: Medicare Other | Source: Ambulatory Visit | Attending: Internal Medicine | Admitting: Internal Medicine

## 2023-07-13 ENCOUNTER — Other Ambulatory Visit: Payer: Self-pay | Admitting: Internal Medicine

## 2023-07-13 DIAGNOSIS — M160 Bilateral primary osteoarthritis of hip: Secondary | ICD-10-CM

## 2023-07-19 ENCOUNTER — Emergency Department (HOSPITAL_COMMUNITY)
Admission: EM | Admit: 2023-07-19 | Discharge: 2023-07-19 | Disposition: A | Discharge status: Home / Self Care | Payer: Medicare Other | Attending: Emergency Medicine | Admitting: Emergency Medicine

## 2023-07-19 ENCOUNTER — Encounter (HOSPITAL_COMMUNITY): Payer: Self-pay

## 2023-07-19 ENCOUNTER — Other Ambulatory Visit: Payer: Self-pay

## 2023-07-19 DIAGNOSIS — M5431 Sciatica, right side: Secondary | ICD-10-CM | POA: Insufficient documentation

## 2023-07-19 DIAGNOSIS — M79604 Pain in right leg: Secondary | ICD-10-CM | POA: Diagnosis present

## 2023-07-19 MED ORDER — GABAPENTIN 100 MG PO CAPS: 100.0000 mg | ORAL_CAPSULE | Freq: Three times a day (TID) | ORAL | 0 refills | Status: DC

## 2023-07-19 MED ORDER — HYDROCODONE-ACETAMINOPHEN 5-325 MG PO TABS
1.0000 | ORAL_TABLET | Freq: Once | ORAL | Status: AC
  Administered 2023-07-19: 1 via ORAL
  Filled 2023-07-19: qty 1

## 2023-07-19 NOTE — ED Provider Notes (Signed)
[place] EMERGENCY DEPARTMENT AT [facility] Provider Note   CSN: [id number] Arrival date & time: 07/19/23  4098     History  Chief Complaint  Patient presents with   Leg Pain    Rhonda Watkins is a 89 y.o. female past medical history significant for right knee Baker's cyst and sciatica of right leg presents today for continued right leg pain.  Patient states that her pain has not changed since her last visit on 11/30 but she is out of the prescription that she was prescribed at this time.  Patient states she was unaware that she should follow-up with her PCP or orthopedics for further evaluation and treatment of this issue.  Patient endorses radiating pain down her right leg and pain behind her right knee.  Patient denies numbness, injury, saddle anesthesia, back pain, or loss of bowel or bladder.  Patient denies fever or erythema to the right knee.   Leg Pain      Home Medications Prior to Admission medications   Medication Sig Start Date End Date Taking? Authorizing Provider  gabapentin (NEURONTIN) 100 MG capsule Take 1 capsule (100 mg total) by mouth 3 (three) times daily for 14 days. 07/19/23 08/02/23 Yes Dolphus Jenny, PA-C  acetaminophen (TYLENOL 8 HOUR) 650 MG CR tablet Take 1 tablet (650 mg total) by mouth every 6 (six) hours as needed for pain or fever. 07/06/23   Derwood Kaplan, MD  albuterol (VENTOLIN HFA) 108 (90 Base) MCG/ACT inhaler Inhale 1-2 puffs into the lungs every 6 (six) hours as needed for wheezing or shortness of breath. 05/27/23   Birder Robson, MD  atorvastatin (LIPITOR) 10 MG tablet Take 10 mg by mouth daily. 12/27/17   [provider]  cephALEXin (KEFLEX) 500 MG capsule Take 1 capsule (500 mg total) by mouth 4 (four) times daily. 07/06/23   Derwood Kaplan, MD  chlorhexidine (PERIDEX) 0.12 % solution Use as directed 15 mLs in the mouth or throat 2 (two) times daily as needed (as directed).    [provider]  Cholecalciferol  (VITAMIN D3) 75 MCG (3000 UT) TABS Take 3,000 Units by mouth daily.    [provider]  famotidine (PEPCID) 20 MG tablet Take 1 tablet (20 mg total) by mouth daily as needed for heartburn or indigestion. 05/27/23   Birder Robson, MD  fluticasone (FLONASE) 50 MCG/ACT nasal spray Place 2 sprays into both nostrils daily as needed for allergies or rhinitis. 05/27/23   Birder Robson, MD  Fluticasone Furoate (ARNUITY ELLIPTA) 100 MCG/ACT AEPB Inhale 1 puff into the lungs daily. 01/07/23   Birder Robson, MD  furosemide (LASIX) 40 MG tablet Take 40 mg by mouth daily. 08/31/16   [provider]  glucose blood (ONETOUCH VERIO) test strip CHECK BLOOD SUGAR TWICE DAILY AS DIRECTED.    [provider]  guaiFENesin (MUCINEX) 600 MG 12 hr tablet Take 600 mg by mouth 2 (two) times daily as needed for cough or to loosen phlegm.    [provider]  HYDROcodone-acetaminophen (NORCO/VICODIN) 5-325 MG tablet Take 0.5-1 tablets by mouth every 6 (six) hours as needed. 07/11/23   Elpidio Anis, PA-C  JARDIANCE 25 MG TABS tablet Take 25 mg by mouth daily.    [provider]  Lancets MISC twice a day as directed 03/16/20   [provider]  LANTUS SOLOSTAR 100 UNIT/ML Solostar Pen Inject 12 Units into the skin at bedtime.    [provider]  mometasone (  ASMANEX, 14 METERED DOSES,) 220 MCG/ACT inhaler Inhale 1 puff into the lungs daily. 05/27/23   Birder Robson, MD  naproxen (NAPROSYN) 375 MG tablet Take 1 tablet (375 mg total) by mouth 2 (two) times daily with a meal. 07/06/23   Derwood Kaplan, MD  omeprazole (PRILOSEC) 20 MG capsule Take 1 capsule (20 mg total) by mouth daily as needed (for heartburn). 05/27/23   Birder Robson, MD  polyethylene glycol (MIRALAX / GLYCOLAX) 17 g packet Take 17 g by mouth 2 (two) times daily. 11/13/22   Glade Lloyd, MD  senna (SENOKOT) 8.6 MG TABS tablet Take 2 tablets (17.2 mg total) by mouth daily. 11/13/22   Glade Lloyd, MD   SURE COMFORT PEN NEEDLES 31G X 8 MM MISC SMARTSIG:Injection 11/21/20   [provider]  Vitamins-Lipotropics (LIPO FLAVONOID PLUS) TABS Take 1 tablet by mouth 2 (two) times daily.    [provider]      Allergies    Jardiance [empagliflozin], Linagliptin, and Metformin and related    Review of Systems   Review of Systems  Musculoskeletal:  Positive for arthralgias.    Physical Exam Updated Vital Signs BP (!) 154/72 (BP Location: Left Arm)   Pulse 74   Temp 97.7 F (36.5 C) (Oral)   Resp 16   Ht 5\' 2"  (1.575 m)   Wt 56.2 kg   SpO2 100%   BMI 22.68 kg/m  Physical Exam Vitals and nursing note reviewed.  Constitutional:      General: She is not in acute distress.    Appearance: She is well-developed.  HENT:     Head: Normocephalic and atraumatic.     Right Ear: External ear normal.     Left Ear: External ear normal.  Eyes:     Conjunctiva/sclera: Conjunctivae normal.  Cardiovascular:     Rate and Rhythm: Normal rate and regular rhythm.     Heart sounds: No murmur heard. Pulmonary:     Effort: Pulmonary effort is normal. No respiratory distress.     Breath sounds: Normal breath sounds.  Abdominal:     Palpations: Abdomen is soft.     Tenderness: There is no abdominal tenderness.  Musculoskeletal:        General: No swelling.     Cervical back: Normal range of motion and neck supple.     Comments: Minimal swelling to posterior right knee without erythema.  Lateral right thigh and posterior right knee TTP.  Patient is neurovascularly intact  Skin:    General: Skin is warm and dry.     Capillary Refill: Capillary refill takes less than 2 seconds.     Findings: No erythema.  Neurological:     General: No focal deficit present.     Mental Status: She is alert.  Psychiatric:        Mood and Affect: Mood normal.     ED Results / Procedures / Treatments   Labs (all labs ordered are listed, but only abnormal results are displayed) Labs Reviewed -  No data to display  EKG None  Radiology No results found.  Procedures Procedures    Medications Ordered in ED Medications  HYDROcodone-acetaminophen (NORCO/VICODIN) 5-325 MG per tablet 1 tablet (has no administration in time range)    ED Course/ Medical Decision Making/ A&P                                 Medical  Decision Making Risk Prescription drug management.   This patient presents to the ED with chief complaint(s) of right leg pain with pertinent past medical history of sciatica and Baker's cyst which further complicates the presenting complaint. The complaint involves an extensive differential diagnosis and also carries with it a high risk of complications and morbidity.    The differential diagnosis includes sciatica, Baker's cyst  Additional history obtained: Records reviewed previous admission documents  ED Course and Reassessment: Patient given Norco for pain while in ED.  Consultation: - Consulted or discussed management/test interpretation w/ external professional: None  Consideration for admission or further workup: Considered for admission or further workup however patient's vital signs and physical exam are reassuring.  Patient symptoms have not changed since previous visit.  Patient's symptoms likely due to diagnosed Baker's cyst and/or sciatica.  Patient given short course of gabapentin for neuropathic pain.  Patient advised to follow-up with PCP or orthopedics for further evaluation and treatment in the future.        Final Clinical Impression(s) / ED Diagnoses Final diagnoses:  Sciatica of right side  Right leg pain    Rx / DC Orders ED Discharge Orders          Ordered    gabapentin (NEURONTIN) 100 MG capsule  3 times daily        07/19/23 0716              Dolphus Jenny, PA-C 07/19/23 1610    Maia Plan, MD 07/21/23 1245

## 2023-07-19 NOTE — ED Notes (Signed)
Call placed to son Lorin Picket x2 regarding discharge arrangements

## 2023-07-19 NOTE — ED Notes (Signed)
Call placed to Kim Brain for follow up.

## 2023-07-19 NOTE — ED Triage Notes (Signed)
Pt reports that she has and right leg pain x 1 month and was seen last week for same. Pt was unable to get prescribed meds filled and pain seems to have worsened. Denies specific injury.

## 2023-07-19 NOTE — ED Notes (Signed)
Call placed to Kim Brain for discharge arrangements.

## 2023-07-19 NOTE — Discharge Instructions (Addendum)
Today you were seen for right leg pain.  Please schedule an appointment with your PCP for further evaluation and treatment.  Please schedule an appointment with orthopedics as needed.  Thank you for letting us treat you today. After performing a physical exam, I feel you are safe to go home. Please follow up with your PCP in the next several days and provide them with your records from this visit. Return to the Emergency Room if pain becomes severe or symptoms worsen.

## 2023-07-20 ENCOUNTER — Encounter (INDEPENDENT_AMBULATORY_CARE_PROVIDER_SITE_OTHER): Payer: Self-pay | Admitting: Otolaryngology

## 2023-07-23 ENCOUNTER — Emergency Department (HOSPITAL_COMMUNITY)
Admission: EM | Admit: 2023-07-23 | Discharge: 2023-07-24 | Disposition: A | Discharge status: Home / Self Care | Payer: Medicare Other | Attending: Emergency Medicine | Admitting: Emergency Medicine

## 2023-07-23 ENCOUNTER — Emergency Department (HOSPITAL_COMMUNITY): Payer: Medicare Other

## 2023-07-23 ENCOUNTER — Encounter (HOSPITAL_COMMUNITY): Payer: Self-pay

## 2023-07-23 ENCOUNTER — Other Ambulatory Visit: Payer: Self-pay

## 2023-07-23 DIAGNOSIS — M5126 Other intervertebral disc displacement, lumbar region: Secondary | ICD-10-CM

## 2023-07-23 DIAGNOSIS — R2241 Localized swelling, mass and lump, right lower limb: Secondary | ICD-10-CM | POA: Diagnosis present

## 2023-07-23 DIAGNOSIS — M5432 Sciatica, left side: Secondary | ICD-10-CM | POA: Diagnosis not present

## 2023-07-23 DIAGNOSIS — Z794 Long term (current) use of insulin: Secondary | ICD-10-CM | POA: Insufficient documentation

## 2023-07-23 DIAGNOSIS — M5431 Sciatica, right side: Secondary | ICD-10-CM

## 2023-07-23 DIAGNOSIS — M79604 Pain in right leg: Secondary | ICD-10-CM

## 2023-07-23 LAB — BASIC METABOLIC PANEL
Anion gap: 7 (ref 5–15)
BUN: 22 mg/dL (ref 8–23)
CO2: 22 mmol/L (ref 22–32)
Calcium: 9.4 mg/dL (ref 8.9–10.3)
Chloride: 106 mmol/L (ref 98–111)
Creatinine, Ser: 0.57 mg/dL (ref 0.44–1.00)
GFR, Estimated: >60 mL/min (ref >60)
Glucose, Bld: 222 mg/dL — ABNORMAL HIGH (ref 70–99)
Potassium: 3.7 mmol/L (ref 3.5–5.1)
Sodium: 135 mmol/L (ref 135–145)

## 2023-07-23 LAB — CBC WITH DIFFERENTIAL/PLATELET
Abs Immature Granulocytes: 0.03 10*3/uL (ref 0.00–0.07)
Basophils Absolute: 0.1 10*3/uL (ref 0.0–0.1)
Basophils Relative: 1 %
Eosinophils Absolute: 0.2 10*3/uL (ref 0.0–0.5)
Eosinophils Relative: 2 %
HCT: 40.5 % (ref 36.0–46.0)
Hemoglobin: 13.2 g/dL (ref 12.0–15.0)
Immature Granulocytes: 0 %
Lymphocytes Relative: 24 %
Lymphs Abs: 1.7 10*3/uL (ref 0.7–4.0)
MCH: 30.5 pg (ref 26.0–34.0)
MCHC: 32.6 g/dL (ref 30.0–36.0)
MCV: 93.5 fL (ref 80.0–100.0)
Monocytes Absolute: 0.5 10*3/uL (ref 0.1–1.0)
Monocytes Relative: 7 %
Neutro Abs: 4.8 10*3/uL (ref 1.7–7.7)
Neutrophils Relative %: 66 %
Platelets: 191 10*3/uL (ref 150–400)
RBC: 4.33 MIL/uL (ref 3.87–5.11)
RDW: 13.2 % (ref 11.5–15.5)
WBC: 7.3 10*3/uL (ref 4.0–10.5)
nRBC: 0 % (ref 0.0–0.2)

## 2023-07-23 LAB — URINALYSIS, ROUTINE W REFLEX MICROSCOPIC
Bacteria, UA: NONE SEEN
Bilirubin Urine: NEGATIVE
Glucose, UA: >=500 mg/dL — AB
Ketones, ur: NEGATIVE mg/dL
Nitrite: NEGATIVE
Protein, ur: NEGATIVE mg/dL
Specific Gravity, Urine: 1.01 (ref 1.005–1.030)
pH: 6 (ref 5.0–8.0)

## 2023-07-23 MED ORDER — HYDROMORPHONE HCL 1 MG/ML IJ SOLN
0.5000 mg | Freq: Once | INTRAMUSCULAR | Status: AC
  Administered 2023-07-23: 0.5 mg via INTRAVENOUS
  Filled 2023-07-23: qty 1

## 2023-07-23 MED ORDER — ONDANSETRON HCL 4 MG/2ML IJ SOLN
4.0000 mg | Freq: Once | INTRAMUSCULAR | Status: AC
  Administered 2023-07-23: 4 mg via INTRAVENOUS
  Filled 2023-07-23: qty 2

## 2023-07-23 MED ORDER — SODIUM CHLORIDE 0.9 % IV BOLUS
500.0000 mL | Freq: Once | INTRAVENOUS | Status: AC
  Administered 2023-07-23: 500 mL via INTRAVENOUS

## 2023-07-23 MED ORDER — DEXAMETHASONE SODIUM PHOSPHATE 10 MG/ML IJ SOLN
10.0000 mg | Freq: Once | INTRAMUSCULAR | Status: AC
  Administered 2023-07-23: 10 mg via INTRAVENOUS
  Filled 2023-07-23: qty 1

## 2023-07-23 NOTE — Discharge Instructions (Signed)
You were seen in the Emergency Department for back pain and right lower extremity pain The back pain is most likely caused by a protruding disc in your spine We talked about this with Dr. Wynetta Emery who recommended we give you steroids and have you follow-up in his office No need for surgery now It is important that you follow-up with Dr. Wynetta Emery.  Please call his office to make an appointment Return to the emerged part for severe pain, if you are unable to walk or have any other concerns

## 2023-07-23 NOTE — ED Provider Notes (Addendum)
[place] EMERGENCY DEPARTMENT AT [facility] Provider Note   CSN: [id number] Arrival date & time: 07/23/23  0532     History  Chief Complaint  Patient presents with   RIght Leg Pain    Rhonda Watkins is a 89 y.o. female.  Patient presents to the emergency department, once again, with complaints of pain of her right leg.  No new injury or developments.  Pain worsens when she walks.       Home Medications Prior to Admission medications   Medication Sig Start Date End Date Taking? Authorizing Provider  acetaminophen (TYLENOL 8 HOUR) 650 MG CR tablet Take 1 tablet (650 mg total) by mouth every 6 (six) hours as needed for pain or fever. 07/06/23   Derwood Kaplan, MD  albuterol (VENTOLIN HFA) 108 (90 Base) MCG/ACT inhaler Inhale 1-2 puffs into the lungs every 6 (six) hours as needed for wheezing or shortness of breath. 05/27/23   Birder Robson, MD  atorvastatin (LIPITOR) 10 MG tablet Take 10 mg by mouth daily. 12/27/17   [provider]  cephALEXin (KEFLEX) 500 MG capsule Take 1 capsule (500 mg total) by mouth 4 (four) times daily. 07/06/23   Derwood Kaplan, MD  chlorhexidine (PERIDEX) 0.12 % solution Use as directed 15 mLs in the mouth or throat 2 (two) times daily as needed (as directed).    [provider]  Cholecalciferol (VITAMIN D3) 75 MCG (3000 UT) TABS Take 3,000 Units by mouth daily.    [provider]  famotidine (PEPCID) 20 MG tablet Take 1 tablet (20 mg total) by mouth daily as needed for heartburn or indigestion. 05/27/23   Birder Robson, MD  fluticasone (FLONASE) 50 MCG/ACT nasal spray Place 2 sprays into both nostrils daily as needed for allergies or rhinitis. 05/27/23   Birder Robson, MD  Fluticasone Furoate (ARNUITY ELLIPTA) 100 MCG/ACT AEPB Inhale 1 puff into the lungs daily. 01/07/23   Birder Robson, MD  furosemide (LASIX) 40 MG tablet Take 40 mg by mouth daily. 08/31/16   [provider]  gabapentin (NEURONTIN) 100  MG capsule Take 1 capsule (100 mg total) by mouth 3 (three) times daily for 14 days. 07/19/23 08/02/23  Dolphus Jenny, PA-C  glucose blood (ONETOUCH VERIO) test strip CHECK BLOOD SUGAR TWICE DAILY AS DIRECTED.    [provider]  guaiFENesin (MUCINEX) 600 MG 12 hr tablet Take 600 mg by mouth 2 (two) times daily as needed for cough or to loosen phlegm.    [provider]  HYDROcodone-acetaminophen (NORCO/VICODIN) 5-325 MG tablet Take 0.5-1 tablets by mouth every 6 (six) hours as needed. 07/11/23   Elpidio Anis, PA-C  JARDIANCE 25 MG TABS tablet Take 25 mg by mouth daily.    [provider]  Lancets MISC twice a day as directed 03/16/20   [provider]  LANTUS SOLOSTAR 100 UNIT/ML Solostar Pen Inject 12 Units into the skin at bedtime.    [provider]  mometasone (ASMANEX, 14 METERED DOSES,) 220 MCG/ACT inhaler Inhale 1 puff into the lungs daily. 05/27/23   Birder Robson, MD  naproxen (NAPROSYN) 375 MG tablet Take 1 tablet (375 mg total) by mouth 2 (two) times daily with a meal. 07/06/23   Derwood Kaplan, MD  omeprazole (PRILOSEC) 20 MG capsule Take 1 capsule (20 mg total) by mouth daily as needed (for heartburn). 05/27/23   Birder Robson, MD  polyethylene glycol (MIRALAX / GLYCOLAX) 17 g packet Take 17 g  by mouth 2 (two) times daily. 11/13/22   Glade Lloyd, MD  senna (SENOKOT) 8.6 MG TABS tablet Take 2 tablets (17.2 mg total) by mouth daily. 11/13/22   Glade Lloyd, MD  SURE COMFORT PEN NEEDLES 31G X 8 MM MISC SMARTSIG:Injection 11/21/20   [provider]  Vitamins-Lipotropics (LIPO FLAVONOID PLUS) TABS Take 1 tablet by mouth 2 (two) times daily.    [provider]      Allergies    Jardiance [empagliflozin], Linagliptin, and Metformin and related    Review of Systems   Review of Systems  Physical Exam Updated Vital Signs BP (!) 158/130   Pulse 70   Temp 97.8 F (36.6 C) (Oral)   Resp 16   SpO2 100%  Physical  Exam Vitals and nursing note reviewed.  Constitutional:      General: She is not in acute distress.    Appearance: She is well-developed.  HENT:     Head: Normocephalic and atraumatic.     Mouth/Throat:     Mouth: Mucous membranes are moist.  Eyes:     General: Vision grossly intact. Gaze aligned appropriately.     Extraocular Movements: Extraocular movements intact.     Conjunctiva/sclera: Conjunctivae normal.  Cardiovascular:     Rate and Rhythm: Normal rate and regular rhythm.     Pulses: Normal pulses.     Heart sounds: Normal heart sounds, S1 normal and S2 normal. No murmur heard.    No friction rub. No gallop.  Pulmonary:     Effort: Pulmonary effort is normal. No respiratory distress.     Breath sounds: Normal breath sounds.  Abdominal:     General: Bowel sounds are normal.     Palpations: Abdomen is soft.     Tenderness: There is no abdominal tenderness. There is no guarding or rebound.     Hernia: No hernia is present.  Musculoskeletal:        General: No swelling.     Cervical back: Full passive range of motion without pain, normal range of motion and neck supple. No spinous process tenderness or muscular tenderness. Normal range of motion.     Lumbar back: Positive right straight leg raise test.     Right lower leg: Swelling present. No edema.     Left lower leg: No edema.  Skin:    General: Skin is warm and dry.     Capillary Refill: Capillary refill takes less than 2 seconds.     Findings: No ecchymosis, erythema, rash or wound.  Neurological:     General: No focal deficit present.     Mental Status: She is alert and oriented to person, place, and time.     GCS: GCS eye subscore is 4. GCS verbal subscore is 5. GCS motor subscore is 6.     Cranial Nerves: Cranial nerves 2-12 are intact.     Sensory: Sensation is intact.     Motor: Motor function is intact.     Coordination: Coordination is intact.  Psychiatric:        Attention and Perception: Attention  normal.        Mood and Affect: Mood normal.        Speech: Speech normal.        Behavior: Behavior normal.     ED Results / Procedures / Treatments   Labs (all labs ordered are listed, but only abnormal results are displayed) Labs Reviewed - No data to display  EKG None  Radiology  No results found.  Procedures Procedures    Medications Ordered in ED Medications - No data to display  ED Course/ Medical Decision Making/ A&P                                 Medical Decision Making Amount and/or Complexity of Data Reviewed Labs: ordered. Radiology: ordered.  Risk Prescription drug management.   Patient presents with right leg pain.  Records reviewed, patient has had multiple visits for same in the last few weeks.  She has had workup including blood work and venous duplex.  Patient has a Baker's cyst but no other pathology noted, no DVT.  Legs look like she has chronic venous stasis, right worse than left.  No signs of cellulitis.    Patient also carries a prior diagnosis of sciatica.  Examination is most consistent with radicular right leg pain.  Reviewing her records reveals multiple lumbar surgeries in the past.  Recent x-ray of hips was unremarkable.  Will perform MRI and provide analgesia.  Patient will be signed out to oncoming ER physician to follow results.       Final Clinical Impression(s) / ED Diagnoses Final diagnoses:  Sciatica of right side  Pain of right lower extremity    Rx / DC Orders ED Discharge Orders     None         Jonet Mathies, Canary Brim, MD 07/23/23 0347    Gilda Crease, MD 07/23/23 [id number]

## 2023-07-23 NOTE — ED Provider Notes (Signed)
  Physical Exam  BP (!) 193/168   Pulse 78   Temp 97.8 F (36.6 C) (Oral)   Resp 16   SpO2 100%   Physical Exam Vitals and nursing note reviewed.  HENT:     Head: Normocephalic and atraumatic.  Eyes:     Pupils: Pupils are equal, round, and reactive to light.  Cardiovascular:     Rate and Rhythm: Normal rate and regular rhythm.  Pulmonary:     Effort: Pulmonary effort is normal.     Breath sounds: Normal breath sounds.  Abdominal:     Palpations: Abdomen is soft.     Tenderness: There is no abdominal tenderness.  Musculoskeletal:     Comments: Midline and right paraspinal lumbar pain Pain with palpation over right lateral thigh Sensation tact light touch throughout 5 out of 5 motor strength bilateral lower extremities To plus DP pulses but already  Skin:    General: Skin is warm and dry.  Neurological:     Mental Status: She is alert.  Psychiatric:        Mood and Affect: Mood normal.     Procedures  Procedures  ED Course / MDM   Clinical Course as of 07/23/23 1309  Thu Jul 23, 2023  1147 MRI reveals progressive adjacent segment changes at L2-L3 with disc extrusion new from previous MRI of 2018, several other small chronic changes.  Discussed MRI findings with Dr. Wynetta Emery (neurosurgery) who has previously operated on this patient.  He recommends a course of steroids and outpatient follow-up. [MP]  1307 Inform patient and her family member at bedside of results and plan for close office follow-up with Dr.cram.  UA negative.  Will discharge with neurosurgical follow-up [MP]    Clinical Course User Index [MP] Royanne Foots, DO   Medical Decision Making I, Estelle June DO, have assumed care of this patient from the previous provider pending MRI, UA reevaluation and disposition  Amount and/or Complexity of Data Reviewed Labs: ordered. Radiology: ordered.  Risk Prescription drug management.   Final diagnosis Sciatica of right side Pain of right lower  extremity Lumbar disc herniation       Royanne Foots, DO 07/23/23 1309

## 2023-07-23 NOTE — ED Triage Notes (Signed)
Pt BIB EMS from [place] at [facility] for right thigh pain. Pt has been here a couple times before for the same thing. Pt is able to bear weight but appears "swollen" per EMS. Gabapentin was prescribed for pt but was not able to take it yesterday.   Pt also wants to be checked for a UTI.

## 2023-09-12 ENCOUNTER — Emergency Department (HOSPITAL_COMMUNITY): Payer: Medicare Other

## 2023-09-12 ENCOUNTER — Other Ambulatory Visit: Payer: Self-pay

## 2023-09-12 ENCOUNTER — Inpatient Hospital Stay (HOSPITAL_COMMUNITY)
Admission: EM | Admit: 2023-09-12 | Discharge: 2023-09-17 | DRG: 481 | Disposition: A | Discharge status: Skilled Nursing Facility | Payer: Medicare Other | Attending: Hospitalist | Admitting: Hospitalist

## 2023-09-12 ENCOUNTER — Encounter (HOSPITAL_COMMUNITY): Payer: Self-pay | Admitting: Emergency Medicine

## 2023-09-12 DIAGNOSIS — M5440 Lumbago with sciatica, unspecified side: Secondary | ICD-10-CM | POA: Diagnosis not present

## 2023-09-12 DIAGNOSIS — S72491A Other fracture of lower end of right femur, initial encounter for closed fracture: Secondary | ICD-10-CM | POA: Diagnosis not present

## 2023-09-12 DIAGNOSIS — D62 Acute posthemorrhagic anemia: Secondary | ICD-10-CM | POA: Diagnosis not present

## 2023-09-12 DIAGNOSIS — E1165 Type 2 diabetes mellitus with hyperglycemia: Secondary | ICD-10-CM | POA: Diagnosis present

## 2023-09-12 DIAGNOSIS — S72401A Unspecified fracture of lower end of right femur, initial encounter for closed fracture: Secondary | ICD-10-CM | POA: Diagnosis present

## 2023-09-12 DIAGNOSIS — K219 Gastro-esophageal reflux disease without esophagitis: Secondary | ICD-10-CM | POA: Diagnosis present

## 2023-09-12 DIAGNOSIS — M48061 Spinal stenosis, lumbar region without neurogenic claudication: Secondary | ICD-10-CM | POA: Diagnosis present

## 2023-09-12 DIAGNOSIS — E7800 Pure hypercholesterolemia, unspecified: Secondary | ICD-10-CM | POA: Diagnosis present

## 2023-09-12 DIAGNOSIS — S7291XA Unspecified fracture of right femur, initial encounter for closed fracture: Principal | ICD-10-CM

## 2023-09-12 DIAGNOSIS — Z87891 Personal history of nicotine dependence: Secondary | ICD-10-CM | POA: Diagnosis not present

## 2023-09-12 DIAGNOSIS — Z8249 Family history of ischemic heart disease and other diseases of the circulatory system: Secondary | ICD-10-CM

## 2023-09-12 DIAGNOSIS — W010XXA Fall on same level from slipping, tripping and stumbling without subsequent striking against object, initial encounter: Secondary | ICD-10-CM | POA: Diagnosis present

## 2023-09-12 DIAGNOSIS — M1711 Unilateral primary osteoarthritis, right knee: Secondary | ICD-10-CM | POA: Diagnosis present

## 2023-09-12 DIAGNOSIS — Z888 Allergy status to other drugs, medicaments and biological substances status: Secondary | ICD-10-CM | POA: Diagnosis not present

## 2023-09-12 DIAGNOSIS — G8929 Other chronic pain: Secondary | ICD-10-CM | POA: Diagnosis present

## 2023-09-12 DIAGNOSIS — F419 Anxiety disorder, unspecified: Secondary | ICD-10-CM | POA: Diagnosis present

## 2023-09-12 DIAGNOSIS — D72829 Elevated white blood cell count, unspecified: Secondary | ICD-10-CM | POA: Diagnosis present

## 2023-09-12 DIAGNOSIS — I10 Essential (primary) hypertension: Secondary | ICD-10-CM | POA: Diagnosis present

## 2023-09-12 DIAGNOSIS — M4800 Spinal stenosis, site unspecified: Secondary | ICD-10-CM | POA: Diagnosis not present

## 2023-09-12 DIAGNOSIS — M5126 Other intervertebral disc displacement, lumbar region: Secondary | ICD-10-CM | POA: Diagnosis present

## 2023-09-12 DIAGNOSIS — Z794 Long term (current) use of insulin: Secondary | ICD-10-CM

## 2023-09-12 DIAGNOSIS — Z9049 Acquired absence of other specified parts of digestive tract: Secondary | ICD-10-CM | POA: Diagnosis not present

## 2023-09-12 DIAGNOSIS — H9190 Unspecified hearing loss, unspecified ear: Secondary | ICD-10-CM | POA: Diagnosis present

## 2023-09-12 DIAGNOSIS — R600 Localized edema: Secondary | ICD-10-CM | POA: Diagnosis present

## 2023-09-12 DIAGNOSIS — Z79899 Other long term (current) drug therapy: Secondary | ICD-10-CM

## 2023-09-12 DIAGNOSIS — Z7984 Long term (current) use of oral hypoglycemic drugs: Secondary | ICD-10-CM | POA: Diagnosis not present

## 2023-09-12 DIAGNOSIS — S63502A Unspecified sprain of left wrist, initial encounter: Secondary | ICD-10-CM | POA: Diagnosis present

## 2023-09-12 DIAGNOSIS — S72451A Displaced supracondylar fracture without intracondylar extension of lower end of right femur, initial encounter for closed fracture: Secondary | ICD-10-CM | POA: Diagnosis not present

## 2023-09-12 DIAGNOSIS — E1169 Type 2 diabetes mellitus with other specified complication: Secondary | ICD-10-CM | POA: Diagnosis present

## 2023-09-12 DIAGNOSIS — J4530 Mild persistent asthma, uncomplicated: Secondary | ICD-10-CM | POA: Diagnosis present

## 2023-09-12 LAB — CBC WITH DIFFERENTIAL/PLATELET
Abs Immature Granulocytes: 0.09 10*3/uL — ABNORMAL HIGH (ref 0.00–0.07)
Basophils Absolute: 0 10*3/uL (ref 0.0–0.1)
Basophils Relative: 0 %
Eosinophils Absolute: 0 10*3/uL (ref 0.0–0.5)
Eosinophils Relative: 0 %
HCT: 40.9 % (ref 36.0–46.0)
Hemoglobin: 13 g/dL (ref 12.0–15.0)
Immature Granulocytes: 1 %
Lymphocytes Relative: 12 %
Lymphs Abs: 1.4 10*3/uL (ref 0.7–4.0)
MCH: 30.1 pg (ref 26.0–34.0)
MCHC: 31.8 g/dL (ref 30.0–36.0)
MCV: 94.7 fL (ref 80.0–100.0)
Monocytes Absolute: 1.1 10*3/uL — ABNORMAL HIGH (ref 0.1–1.0)
Monocytes Relative: 9 %
Neutro Abs: 9.4 10*3/uL — ABNORMAL HIGH (ref 1.7–7.7)
Neutrophils Relative %: 78 %
Platelets: 221 10*3/uL (ref 150–400)
RBC: 4.32 MIL/uL (ref 3.87–5.11)
RDW: 13.3 % (ref 11.5–15.5)
WBC: 12 10*3/uL — ABNORMAL HIGH (ref 4.0–10.5)
nRBC: 0 % (ref 0.0–0.2)

## 2023-09-12 LAB — TYPE AND SCREEN
ABO/RH(D): B POS
Antibody Screen: NEGATIVE

## 2023-09-12 LAB — HEMOGLOBIN A1C
Hgb A1c MFr Bld: 8.3 % — ABNORMAL HIGH (ref 4.8–5.6)
Mean Plasma Glucose: 191.51 mg/dL

## 2023-09-12 LAB — BASIC METABOLIC PANEL
Anion gap: 15 (ref 5–15)
BUN: 15 mg/dL (ref 8–23)
CO2: 18 mmol/L — ABNORMAL LOW (ref 22–32)
Calcium: 9.5 mg/dL (ref 8.9–10.3)
Chloride: 107 mmol/L (ref 98–111)
Creatinine, Ser: 0.69 mg/dL (ref 0.44–1.00)
GFR, Estimated: >60 mL/min (ref >60)
Glucose, Bld: 169 mg/dL — ABNORMAL HIGH (ref 70–99)
Potassium: 3.5 mmol/L (ref 3.5–5.1)
Sodium: 140 mmol/L (ref 135–145)

## 2023-09-12 LAB — GLUCOSE, CAPILLARY: Glucose-Capillary: 133 mg/dL — ABNORMAL HIGH (ref 70–99)

## 2023-09-12 LAB — PROTIME-INR
INR: 1 (ref 0.8–1.2)
Prothrombin Time: 13.4 s (ref 11.4–15.2)

## 2023-09-12 LAB — CBG MONITORING, ED: Glucose-Capillary: 124 mg/dL — ABNORMAL HIGH (ref 70–99)

## 2023-09-12 MED ORDER — FLUTICASONE FUROATE 100 MCG/ACT IN AEPB: 1.0000 | INHALATION_SPRAY | Freq: Every day | RESPIRATORY_TRACT | Status: DC

## 2023-09-12 MED ORDER — ONDANSETRON HCL 4 MG/2ML IJ SOLN
4.0000 mg | Freq: Once | INTRAMUSCULAR | Status: AC
  Administered 2023-09-12: 4 mg via INTRAVENOUS
  Filled 2023-09-12: qty 2

## 2023-09-12 MED ORDER — MORPHINE SULFATE (PF) 4 MG/ML IV SOLN
4.0000 mg | Freq: Once | INTRAVENOUS | Status: AC
  Administered 2023-09-12: 4 mg via INTRAVENOUS
  Filled 2023-09-12: qty 1

## 2023-09-12 MED ORDER — PANTOPRAZOLE SODIUM 40 MG PO TBEC
40.0000 mg | DELAYED_RELEASE_TABLET | Freq: Every day | ORAL | Status: DC
  Administered 2023-09-14 – 2023-09-17 (×4): 40 mg via ORAL
  Filled 2023-09-12 (×4): qty 1

## 2023-09-12 MED ORDER — FUROSEMIDE 40 MG PO TABS
40.0000 mg | ORAL_TABLET | Freq: Every day | ORAL | Status: DC
  Administered 2023-09-14 – 2023-09-17 (×4): 40 mg via ORAL
  Filled 2023-09-12 (×4): qty 1

## 2023-09-12 MED ORDER — MORPHINE SULFATE (PF) 2 MG/ML IV SOLN
0.5000 mg | INTRAVENOUS | Status: DC | PRN
  Administered 2023-09-12 (×2): 0.5 mg via INTRAVENOUS
  Filled 2023-09-12 (×2): qty 1

## 2023-09-12 MED ORDER — POLYETHYLENE GLYCOL 3350 17 G PO PACK
17.0000 g | PACK | Freq: Every day | ORAL | Status: DC | PRN
Start: 2023-09-12 — End: 2023-09-18

## 2023-09-12 MED ORDER — INSULIN ASPART 100 UNIT/ML IJ SOLN
0.0000 [IU] | Freq: Three times a day (TID) | INTRAMUSCULAR | Status: DC
  Administered 2023-09-13 – 2023-09-14 (×5): 2 [IU] via SUBCUTANEOUS
  Administered 2023-09-15 (×2): 1 [IU] via SUBCUTANEOUS
  Administered 2023-09-16 (×2): 2 [IU] via SUBCUTANEOUS
  Administered 2023-09-17: 3 [IU] via SUBCUTANEOUS
  Administered 2023-09-17: 5 [IU] via SUBCUTANEOUS

## 2023-09-12 MED ORDER — BUDESONIDE 0.5 MG/2ML IN SUSP
0.5000 mg | Freq: Two times a day (BID) | RESPIRATORY_TRACT | Status: DC
  Administered 2023-09-12 – 2023-09-17 (×8): 0.5 mg via RESPIRATORY_TRACT
  Filled 2023-09-12 (×10): qty 2

## 2023-09-12 MED ORDER — HYDROCODONE-ACETAMINOPHEN 5-325 MG PO TABS: 1.0000 | ORAL_TABLET | Freq: Four times a day (QID) | ORAL | Status: DC | PRN

## 2023-09-12 MED ORDER — ATORVASTATIN CALCIUM 10 MG PO TABS
10.0000 mg | ORAL_TABLET | Freq: Every day | ORAL | Status: DC
  Administered 2023-09-14 – 2023-09-17 (×4): 10 mg via ORAL
  Filled 2023-09-12 (×4): qty 1

## 2023-09-12 MED ORDER — ALBUTEROL SULFATE (2.5 MG/3ML) 0.083% IN NEBU
3.0000 mL | INHALATION_SOLUTION | Freq: Four times a day (QID) | RESPIRATORY_TRACT | Status: DC | PRN
  Filled 2023-09-12: qty 3

## 2023-09-12 MED ORDER — MOMETASONE FUROATE 220 MCG/ACT IN AEPB: 1.0000 | INHALATION_SPRAY | Freq: Every day | RESPIRATORY_TRACT | Status: DC

## 2023-09-12 MED ORDER — HYDROMORPHONE HCL 1 MG/ML IJ SOLN
0.5000 mg | INTRAMUSCULAR | Status: DC | PRN
  Administered 2023-09-12 – 2023-09-13 (×4): 0.5 mg via INTRAVENOUS
  Filled 2023-09-12 (×3): qty 0.5
  Filled 2023-09-12: qty 1
  Filled 2023-09-12: qty 0.5

## 2023-09-12 NOTE — ED Provider Notes (Incomplete)
Emergency Department Provider Note   I have reviewed the triage vital signs and the nursing notes.   HISTORY  Chief Complaint No chief complaint on file.   HPI Rhonda Watkins is a 89 y.o. female past history reviewed below including diabetes, hypertension, hyperlipidemia presents to the emergency department from [facility] independent living.  Patient had mechanical fall and was unable to get off the ground.  She has severe pain in the right hip and leg along with some more mild left wrist pain.  EMS report that on scene she was in significant discomfort and they noticed the right leg was shortened and externally rotated.  No report of head trauma or loss of consciousness.  No presyncope prior to falling. Patient received fentanyl en route to the ED and reports continued severe pain.    Past Medical History:  Diagnosis Date   Arthritis    Asthma    Diabetes mellitus without complication (HCC)    GERD (gastroesophageal reflux disease)    Hyperlipidemia    Hypertension    Mitral regurgitation    Shortness of breath dyspnea    occ   UTI (lower urinary tract infection)    Vitamin D deficiency     Review of Systems  Constitutional: No fever/chills Cardiovascular: Denies chest pain. Respiratory: Denies shortness of breath. Gastrointestinal: No abdominal pain.  No nausea, no vomiting.   Musculoskeletal: Positive hip pain and left wrist pain.  Skin: Negative for rash. Neurological: Negative for headaches. No numbness.   ____________________________________________   PHYSICAL EXAM:  VITAL SIGNS: ED Triage Vitals  Encounter Vitals Group     BP 09/12/23 1044 (!) 189/74     Pulse Rate 09/12/23 1044 85     Resp 09/12/23 1044 18     Temp 09/12/23 1044 97.7 F (36.5 C)     Temp Source 09/12/23 1044 Oral     SpO2 09/12/23 1044 100 %   Constitutional: Alert and oriented. Well appearing and in no acute distress. Eyes: Conjunctivae are normal.  Head: Atraumatic. Nose: No  congestion/rhinnorhea. Mouth/Throat: Mucous membranes are moist.   Neck: No stridor.  No cervical spine tenderness to palpation. Cardiovascular: Normal rate, regular rhythm. Good peripheral circulation. Grossly normal heart sounds.   Respiratory: Normal respiratory effort.  No retractions. Lungs CTAB. Gastrointestinal: Soft and nontender. No distention.  Musculoskeletal: Right lower extremity is shortened and externally rotated.  Patient has swelling to the distal femur and more point tenderness to the proximal, right hip. Neurologic:  Normal speech and language. No gross focal neurologic deficits are appreciated.  Skin:  Skin is warm, dry and intact. No rash noted.  ____________________________________________   LABS (all labs ordered are listed, but only abnormal results are displayed)  Labs Reviewed  BASIC METABOLIC PANEL  CBC WITH DIFFERENTIAL/PLATELET  PROTIME-INR  TYPE AND SCREEN   ____________________________________________  EKG  *** ____________________________________________  RADIOLOGY  No results found.  ____________________________________________   PROCEDURES  Procedure(s) performed:   Procedures   ____________________________________________   INITIAL IMPRESSION / ASSESSMENT AND PLAN / ED COURSE  Pertinent labs & imaging results that were available during my care of the patient were reviewed by me and considered in my medical decision making (see chart for details).   This patient is Presenting for Evaluation of fall, which does require a range of treatment options, and is a complaint that involves a high risk of morbidity and mortality.  The Differential Diagnoses include fracture, dislocation, contusion, etc.  Critical Interventions-  Medications  morphine (PF) 4 MG/ML injection 4 mg (4 mg Intravenous Given 09/12/23 1123)  ondansetron (ZOFRAN) injection 4 mg (4 mg Intravenous Given 09/12/23 1122)    Reassessment after intervention:     I  did obtain Additional Historical Information from EMS.    Clinical Laboratory Tests Ordered, included   Radiologic Tests Ordered, included Hip/knee XR. I independently interpreted the images and agree with radiology interpretation.   Cardiac Monitor Tracing which shows NSR.    Social Determinants of Health Risk patient is a non-smoker.   Consult complete with  Medical Decision Making: Summary:  Patient presents to the emergency department with severe right leg pain after mechanical fall.  Some more mild pain to the left wrist.  Suspect fracture in the right leg.  Neurovascularly the leg is intact. No evidence of open fracture.   Reevaluation with update and discussion with   ***Considered admission***  Patient's presentation is most consistent with {EM COPA:27473}   Disposition:   ____________________________________________  FINAL CLINICAL IMPRESSION(S) / ED DIAGNOSES  Final diagnoses:  None     NEW OUTPATIENT MEDICATIONS STARTED DURING THIS VISIT:  New Prescriptions   No medications on file    Note:  This document was prepared using Dragon voice recognition software and may include unintentional dictation errors.  Alona Bene, MD, [facility] Emergency Medicine

## 2023-09-12 NOTE — ED Notes (Signed)
 X-ray at bedside

## 2023-09-12 NOTE — ED Notes (Signed)
 Patient transported to CT

## 2023-09-12 NOTE — H&P (Signed)
History and Physical   Rhonda Watkins YQI:[id number] DOB: 1931/10/11 DOA: 09/12/2023  PCP: Emilio Aspen, MD   Patient coming from: [facility]  Chief Complaint: Fall, hip pain  HPI: Rhonda Watkins is a 89 y.o. female with medical history significant of hypertension, hyperlipidemia, GERD, spinal stenosis, back pain, asthma presenting after a fall at his independent living.  Patient reportedly had mechanical fall at his independent living facility.  She was unable to get up afterwards and had severe pain at the right hip as well as some pain at the left wrist.  On EMS evaluation, external rotation was noted of the right lower and extremity due to patient's pain received fentanyl en route.  Denies fevers, chills, chest pain, shortness breath, abdominal pain, constipation, diarrhea, nausea, vomiting.  ED Course: Vital signs in the ED notable for blood pressure in the 150s to 190s systolic.  Lab workup included BMP with bicarb 19, glucose 169.  CBC with leukocytosis to 12.  PT and INR normal.  Type and screen ordered.  Left wrist x-ray showed bony spur at the right metaphysis unable to exclude fracture and also questionable styloid fracture.  Osteoarthritis also noted.  Right knee x-ray shows spiral fracture of the distal fibular metadiaphysis with mild cavitation.  Pelvis x-ray showed no acute Gershon Mussel.  CT femur is pending.  Patient received morphine, Zofran, placed in a knee immobilizer in the ED.  Orthopedics consulted and plan for surgical intervention tomorrow.  Review of Systems: As per HPI otherwise all other systems reviewed and are negative.  Past Medical History:  Diagnosis Date   Arthritis    Asthma    Diabetes mellitus without complication (HCC)    GERD (gastroesophageal reflux disease)    Hyperlipidemia    Hypertension    Mitral regurgitation    Shortness of breath dyspnea    occ   UTI (lower urinary tract infection)    Vitamin D deficiency      Past Surgical History:  Procedure Laterality Date   BACK SURGERY  85,04   BLADDER REPAIR  95,05   BUNIONECTOMY Left 15   cholecyctectomy     CHOLECYSTECTOMY  80   COLON SURGERY  05   EXPLORATION MIDDLE EAR Left    vertigo- no hearing now- 89 yrs old   HERNIA REPAIR  [phone]   KNEE ARTHROSCOPY     lft 02 rt 09   LAPAROTOMY N/A 11/06/2022   Procedure: EXPLORATORY LAPAROTOMY;  Surgeon: Berna Bue, MD;  Location: [facility];  Service: General;  Laterality: N/A;    Social History  reports that she quit smoking about 65 years ago. Her smoking use included cigarettes. She has been exposed to tobacco smoke. She has never used smokeless tobacco. She reports current alcohol use. She reports that she does not use drugs.  Allergies  Allergen Reactions   Jardiance [Empagliflozin] Other (See Comments)    " It made me really tired"    Linagliptin Other (See Comments)    Tradjenta- Reaction not known by family   Metformin And Related Other (See Comments)    "It made me really tired "    Family History  Problem Relation Age of Onset   Cancer Mother    Heart attack Father    Coronary artery disease Father   reviewed on admission  Prior to Admission medications   Medication Sig Start Date End Date Taking? Authorizing Provider  acetaminophen (TYLENOL 8 HOUR) 650 MG CR tablet Take 1  tablet (650 mg total) by mouth every 6 (six) hours as needed for pain or fever. 07/06/23   Derwood Kaplan, MD  albuterol (VENTOLIN HFA) 108 (90 Base) MCG/ACT inhaler Inhale 1-2 puffs into the lungs every 6 (six) hours as needed for wheezing or shortness of breath. 05/27/23   Birder Robson, MD  atorvastatin (LIPITOR) 10 MG tablet Take 10 mg by mouth daily. 12/27/17   [provider]  cephALEXin (KEFLEX) 500 MG capsule Take 1 capsule (500 mg total) by mouth 4 (four) times daily. 07/06/23   Derwood Kaplan, MD  chlorhexidine (PERIDEX) 0.12 % solution Use as directed 15 mLs in the mouth or throat 2  (two) times daily as needed (as directed).    [provider]  Cholecalciferol (VITAMIN D3) 75 MCG (3000 UT) TABS Take 3,000 Units by mouth daily.    [provider]  famotidine (PEPCID) 20 MG tablet Take 1 tablet (20 mg total) by mouth daily as needed for heartburn or indigestion. 05/27/23   Birder Robson, MD  fluticasone (FLONASE) 50 MCG/ACT nasal spray Place 2 sprays into both nostrils daily as needed for allergies or rhinitis. 05/27/23   Birder Robson, MD  Fluticasone Furoate (ARNUITY ELLIPTA) 100 MCG/ACT AEPB Inhale 1 puff into the lungs daily. 01/07/23   Birder Robson, MD  furosemide (LASIX) 40 MG tablet Take 40 mg by mouth daily. 08/31/16   [provider]  gabapentin (NEURONTIN) 100 MG capsule Take 1 capsule (100 mg total) by mouth 3 (three) times daily for 14 days. 07/19/23 08/02/23  Dolphus Jenny, PA-C  glucose blood (ONETOUCH VERIO) test strip CHECK BLOOD SUGAR TWICE DAILY AS DIRECTED.    [provider]  guaiFENesin (MUCINEX) 600 MG 12 hr tablet Take 600 mg by mouth 2 (two) times daily as needed for cough or to loosen phlegm.    [provider]  HYDROcodone-acetaminophen (NORCO/VICODIN) 5-325 MG tablet Take 0.5-1 tablets by mouth every 6 (six) hours as needed. 07/11/23   Elpidio Anis, PA-C  JARDIANCE 25 MG TABS tablet Take 25 mg by mouth daily.    [provider]  Lancets MISC twice a day as directed 03/16/20   [provider]  LANTUS SOLOSTAR 100 UNIT/ML Solostar Pen Inject 12 Units into the skin at bedtime.    [provider]  mometasone (ASMANEX, 14 METERED DOSES,) 220 MCG/ACT inhaler Inhale 1 puff into the lungs daily. 05/27/23   Birder Robson, MD  naproxen (NAPROSYN) 375 MG tablet Take 1 tablet (375 mg total) by mouth 2 (two) times daily with a meal. 07/06/23   Derwood Kaplan, MD  omeprazole (PRILOSEC) 20 MG capsule Take 1 capsule (20 mg total) by mouth daily as needed (for heartburn). 05/27/23   Birder Robson, MD  polyethylene glycol (MIRALAX / GLYCOLAX) 17 g packet Take 17 g by mouth 2 (two) times daily. 11/13/22   Glade Lloyd, MD  senna (SENOKOT) 8.6 MG TABS tablet Take 2 tablets (17.2 mg total) by mouth daily. 11/13/22   Glade Lloyd, MD  SURE COMFORT PEN NEEDLES 31G X 8 MM MISC SMARTSIG:Injection 11/21/20   [provider]  Vitamins-Lipotropics (LIPO FLAVONOID PLUS) TABS Take 1 tablet by mouth 2 (two) times daily.    [provider]    Physical Exam: Vitals:   09/12/23 1215 09/12/23 1230 09/12/23 1245 09/12/23 1300  BP: (!) 152/65 (!) 160/69 (!) 155/52 (!) 170/74  Pulse: 77 73 76 90  Resp: 16 13 14  20  Temp:      TempSrc:      SpO2: 100% 100% 94% 99%    Physical Exam Constitutional:      General: She is not in acute distress.    Appearance: Normal appearance.  HENT:     Head: Normocephalic and atraumatic.     Mouth/Throat:     Mouth: Mucous membranes are moist.     Pharynx: Oropharynx is clear.  Eyes:     Extraocular Movements: Extraocular movements intact.     Pupils: Pupils are equal, round, and reactive to light.  Cardiovascular:     Rate and Rhythm: Normal rate and regular rhythm.     Pulses: Normal pulses.     Heart sounds: Normal heart sounds.  Pulmonary:     Effort: Pulmonary effort is normal. No respiratory distress.     Breath sounds: Normal breath sounds.  Abdominal:     General: Bowel sounds are normal. There is no distension.     Palpations: Abdomen is soft.     Tenderness: There is no abdominal tenderness.  Musculoskeletal:        General: No swelling or deformity.     Comments: Bilateral lower extremities neurovascularly intact.  Right lower extremity is foreshortened and externally rotated.  Skin:    General: Skin is warm and dry.  Neurological:     General: No focal deficit present.     Mental Status: Mental status is at baseline.     Labs on Admission: I have personally reviewed following labs and imaging studies  CBC: Recent  Labs  Lab 09/12/23 1114  WBC 12.0*  NEUTROABS 9.4*  HGB 13.0  HCT 40.9  MCV 94.7  PLT 221    Basic Metabolic Panel: Recent Labs  Lab 09/12/23 1114  NA 140  K 3.5  CL 107  CO2 18*  GLUCOSE 169*  BUN 15  CREATININE 0.69  CALCIUM 9.5    GFR: CrCl cannot be calculated (Unknown ideal weight.).  Liver Function Tests: No results for input(s): "AST", "ALT", "ALKPHOS", "BILITOT", "PROT", "ALBUMIN" in the last 168 hours.  Urine analysis:    Component Value Date/Time   COLORURINE YELLOW 07/23/2023 1236   APPEARANCEUR CLEAR 07/23/2023 1236   LABSPEC 1.010 07/23/2023 1236   PHURINE 6.0 07/23/2023 1236   GLUCOSEU >=500 (A) 07/23/2023 1236   HGBUR SMALL (A) 07/23/2023 1236   BILIRUBINUR NEGATIVE 07/23/2023 1236   KETONESUR NEGATIVE 07/23/2023 1236   PROTEINUR NEGATIVE 07/23/2023 1236   NITRITE NEGATIVE 07/23/2023 1236   LEUKOCYTESUR TRACE (A) 07/23/2023 1236    Radiological Exams on Admission: DG Wrist Complete Left Result Date: 09/12/2023 CLINICAL DATA:  Fall, left wrist pain EXAM: LEFT WRIST - COMPLETE 3+ VIEW COMPARISON:  None Available. FINDINGS: Bony ridging along the lateral and dorsal distal radial metaphysis, possibly spurring or deformity from an old injury, difficult to exclude a buckle type fracture. Mild soft tissue swelling overlying the distal ulna, questionable nondisplaced transverse fracture of the ulnar styloid on the oblique projection. Advanced osteoarthritis at the first carpometacarpal articulation. Faint calcifications noted along the intrinsic interosseous ligaments but not along the TFCC disc. IMPRESSION: 1. Bony ridging along the lateral and dorsal distal radial metaphysis, possibly spurring or deformity from an old injury, difficult to exclude a buckle type fracture. 2. Questionable nondisplaced transverse fracture of the ulnar styloid. 3. Advanced osteoarthritis at the first carpometacarpal articulation. Electronically Signed   By: Gaylyn Rong M.D.    On: 09/12/2023 12:34   DG Knee Complete 4  Views Right Result Date: 09/12/2023 CLINICAL DATA:  Fall, knee pain EXAM: RIGHT KNEE - COMPLETE 4+ VIEW COMPARISON:  None Available. FINDINGS: Spiral fracture of the distal fibular metadiaphysis. Mild distal comminution. 2.0 cm posteromedial displacement of the distal fragment. Prominent osteoarthritis in the knee.  Meniscal chondrocalcinosis. IMPRESSION: 1. Spiral fracture of the distal fibular metadiaphysis with mild distal comminution. 2. Prominent osteoarthritis in the knee. 3. Meniscal chondrocalcinosis. Electronically Signed   By: Gaylyn Rong M.D.   On: 09/12/2023 12:32   DG Pelvis 1-2 Views Result Date: 09/12/2023 CLINICAL DATA:  Fall, right hip pain EXAM: PELVIS - 1-2 VIEW COMPARISON:  CT pelvis 11/03/2022 FINDINGS: Bony demineralization. Postoperative findings in the lower lumbar spine. Both hips are somewhat externally rotated. No definite fracture identified. IMPRESSION: 1. No definite fracture identified. 2. Bony demineralization. 3. Postoperative findings in the lower lumbar spine. Electronically Signed   By: Gaylyn Rong M.D.   On: 09/12/2023 12:30   EKG: Not performed in emergency department  Assessment/Plan Principal Problem:   Femur fracture, right (HCC) Active Problems:   Essential hypertension   GERD (gastroesophageal reflux disease)   Spinal stenosis   Hypercholesterolemia   Lumbago of lumbar region with sciatica   Mild persistent asthma, uncomplicated   Type 2 diabetes mellitus with other specified complication (HCC)   Fall ?Wrist fracture Femur fracture > Patient fell at independent living facility and was unable to get up afterwards due to severe right hip pain.  Noted to have right distal fibular metadiaphysis fracture on x-ray. > Imaging also showed questionable wrist fractures and has been placed in a stent for this. > Patient placed in a knee immobilizer in the ED and orthopedics consulted with plan for  surgery in the morning. - Monitor on MedSurg overnight with continuous pulse ox - As needed pain control with Tylenol for mild pain, Norco for moderate to severe pain, Dilaudid for breakthrough severe pain - Consult anesthesia for nerve block - Femur to fracture protocol - N.p.o. midnight - SCDs - Supportive care  Hypertension - Continue home Lasix  Hyperlipidemia - Continue atorvastatin  GERD - Continue home PPI  Diabetes - SSI  Spinal stenosis Back pain - On pain control as above here  Asthma - Continue home mometasone and as needed albuterol  DVT prophylaxis: SCDs Code Status:   Full Family Communication:  None on admission  Disposition Plan:   Patient is from:  [facility]  Anticipated DC to:  Pending PT evaluation after surgery  Anticipated DC date:  2 to 4 days  Anticipated DC barriers: None  Consults called:  Orthopedic surgery Admission status:  Inpatient, MedSurg  Severity of Illness: The appropriate patient status for this patient is INPATIENT. Inpatient status is judged to be reasonable and necessary in order to provide the required intensity of service to ensure the patient's safety. The patient's presenting symptoms, physical exam findings, and initial radiographic and laboratory data in the context of their chronic comorbidities is felt to place them at high risk for further clinical deterioration. Furthermore, it is not anticipated that the patient will be medically stable for discharge from the hospital within 2 midnights of admission.   * I certify that at the point of admission it is my clinical judgment that the patient will require inpatient hospital care spanning beyond 2 midnights from the point of admission due to high intensity of service, high risk for further deterioration and high frequency of surveillance required.Synetta Fail MD [facility]  How to contact the [facility] Attending or Consulting provider 7A - 7P  or covering provider during after hours 7P -7A, for this patient?   Check the care team in [facility] and look for a) attending/consulting [facility] provider listed and b) the [facility] team listed Log into [email or web address] and use [place]'s universal password to access. If you do not have the password, please contact the hospital operator. Locate the [facility] provider you are looking for under [facility] and page to a number that you can be directly reached. If you still have difficulty reaching the provider, please page the [facility] (Director on Call) for the Hospitalists listed on amion for assistance.  09/12/2023, 1:42 PM

## 2023-09-12 NOTE — Progress Notes (Signed)
Orthopedic Tech Progress Note Patient Details:  Rhonda Watkins [birth year] [id number]  Ortho Devices Type of Ortho Device: Knee Immobilizer, Velcro wrist splint Ortho Device/Splint Location: LUE, RLE Ortho Device/Splint Interventions: Ordered, Application, Adjustment   Post Interventions Patient Tolerated: Fair Instructions Provided: Care of device  [name] Carmine Savoy 09/12/2023, 2:19 PM

## 2023-09-12 NOTE — ED Notes (Signed)
Admitting at  the bedsisde

## 2023-09-12 NOTE — Plan of Care (Signed)

## 2023-09-12 NOTE — ED Triage Notes (Signed)
Pt BIB [facility] from [facility].  Pt had mechanical fall with R hip and L. Wrist pain.  Hip is shortened and rotated.  No LOC, No thinners, did not hit head. Could not tolerate a C-collar.  Family coming. Pt is A&O.  10/10 pain after 150 mcg of Fentanyl.    183/86 96% HR 86 CBG 164

## 2023-09-12 NOTE — Consult Note (Signed)
ORTHOPAEDIC CONSULTATION  REQUESTING PHYSICIAN: Synetta Fail, MD  Chief Complaint: left wrist and right leg pain  HPI: Rhonda Watkins is a 89 y.o. female who complains of pain in the left wrist and right leg after falling at her independent living facility. She has been dealing with chronic back pain and had an epidural injection a few days ago so she has been sleeping on the couch. She woke up and tried to walk to the bathroom but tripped and fell. She had immediate pain and was unable to get up and weight bear on the left arm or right leg.   Imaging of the right leg shows  - Acute comminuted, spiral oriented fracture of the distal femoral diaphysis. Mild-moderate anteromedial displacement and mild posterior apex angulation. 12.7 cm butterfly fragment with slight anteromedial displacement. Fracture does not extend intra-articularly to involve the knee joint.  - No lytic or sclerotic changes to the bone to suggest a pathologic fracture. - The bones are demineralized. Hip joint intact with mild to moderate osteoarthritis.  - Severe tricompartmental osteoarthritis of the right knee with bone-on-bone apposition in the medial compartment. Small-moderate size knee joint effusion.  Imaging of the left wrist shows - Bony ridging along the lateral and dorsal distal radial metaphysis, possibly spurring or deformity from an old injury, difficult to exclude a buckle type fracture.  - Mild soft tissue swelling overlying the distal ulna, questionable nondisplaced transverse fracture of the ulnar styloid - Advanced osteoarthritis at the first carpometacarpal articulation. - Faint calcifications noted along the intrinsic interosseous ligaments but not along the TFCC disc.   Orthopedics was consulted for evaluation.   Last meal at dinner last night.Only sips of water since then. No history of MI, CVA, DVT, PE.  Previously ambulatory with the use of assistive devices.  The patient is living at  [facility].    Past Medical History:  Diagnosis Date   Arthritis    Asthma    Diabetes mellitus without complication (HCC)    GERD (gastroesophageal reflux disease)    Hyperlipidemia    Hypertension    Mitral regurgitation    Shortness of breath dyspnea    occ   UTI (lower urinary tract infection)    Vitamin D deficiency    Past Surgical History:  Procedure Laterality Date   BACK SURGERY  85,04   BLADDER REPAIR  95,05   BUNIONECTOMY Left 17   cholecyctectomy     CHOLECYSTECTOMY  82   COLON SURGERY  05   EXPLORATION MIDDLE EAR Left    vertigo- no hearing now- 89 yrs old   HERNIA REPAIR  [phone]   KNEE ARTHROSCOPY     lft 02 rt 09   LAPAROTOMY N/A 11/06/2022   Procedure: EXPLORATORY LAPAROTOMY;  Surgeon: Berna Bue, MD;  Location: [facility];  Service: General;  Laterality: N/A;   Social History   Socioeconomic History   Marital status: Widowed    Spouse name: Not on file   Number of children: Not on file   Years of education: Not on file   Highest education level: Not on file  Occupational History   Not on file  Tobacco Use   Smoking status: Former    Current packs/day: 0.00    Types: Cigarettes    Quit date: 09/17/1958    Years since quitting: 65.0    Passive exposure: Past   Smokeless tobacco: Never  Vaping Use   Vaping status: Never Used  Substance and  Sexual Activity   Alcohol use: Yes    Comment: occ   Drug use: Never   Sexual activity: Not on file  Other Topics Concern   Not on file  Social History Narrative   Not on file   Social Drivers of Health   Financial Resource Strain: Not on file  Food Insecurity: Not on file  Transportation Needs: Not on file  Physical Activity: Not on file  Stress: Not on file  Social Connections: Not on file   Family History  Problem Relation Age of Onset   Cancer Mother    Heart attack Father    Coronary artery disease Father    Allergies  Allergen Reactions   Jardiance [Empagliflozin] Other (See  Comments)    " It made me really tired"    Linagliptin Other (See Comments)    Tradjenta- Reaction not known by family   Metformin And Related Other (See Comments)    "It made me really tired "   Prior to Admission medications   Medication Sig Start Date End Date Taking? Authorizing Provider  acetaminophen (TYLENOL 8 HOUR) 650 MG CR tablet Take 1 tablet (650 mg total) by mouth every 6 (six) hours as needed for pain or fever. 07/06/23   Derwood Kaplan, MD  albuterol (VENTOLIN HFA) 108 (90 Base) MCG/ACT inhaler Inhale 1-2 puffs into the lungs every 6 (six) hours as needed for wheezing or shortness of breath. 05/27/23   Birder Robson, MD  atorvastatin (LIPITOR) 10 MG tablet Take 10 mg by mouth daily. 12/27/17   [provider]  cephALEXin (KEFLEX) 500 MG capsule Take 1 capsule (500 mg total) by mouth 4 (four) times daily. 07/06/23   Derwood Kaplan, MD  chlorhexidine (PERIDEX) 0.12 % solution Use as directed 15 mLs in the mouth or throat 2 (two) times daily as needed (as directed).    [provider]  Cholecalciferol (VITAMIN D3) 75 MCG (3000 UT) TABS Take 3,000 Units by mouth daily.    [provider]  famotidine (PEPCID) 20 MG tablet Take 1 tablet (20 mg total) by mouth daily as needed for heartburn or indigestion. 05/27/23   Birder Robson, MD  fluticasone (FLONASE) 50 MCG/ACT nasal spray Place 2 sprays into both nostrils daily as needed for allergies or rhinitis. 05/27/23   Birder Robson, MD  Fluticasone Furoate (ARNUITY ELLIPTA) 100 MCG/ACT AEPB Inhale 1 puff into the lungs daily. 01/07/23   Birder Robson, MD  furosemide (LASIX) 40 MG tablet Take 40 mg by mouth daily. 08/31/16   [provider]  gabapentin (NEURONTIN) 100 MG capsule Take 1 capsule (100 mg total) by mouth 3 (three) times daily for 14 days. 07/19/23 08/02/23  Dolphus Jenny, PA-C  glucose blood (ONETOUCH VERIO) test strip CHECK BLOOD SUGAR TWICE DAILY AS DIRECTED.    [provider]   guaiFENesin (MUCINEX) 600 MG 12 hr tablet Take 600 mg by mouth 2 (two) times daily as needed for cough or to loosen phlegm.    [provider]  HYDROcodone-acetaminophen (NORCO/VICODIN) 5-325 MG tablet Take 0.5-1 tablets by mouth every 6 (six) hours as needed. 07/11/23   Elpidio Anis, PA-C  JARDIANCE 25 MG TABS tablet Take 25 mg by mouth daily.    [provider]  Lancets MISC twice a day as directed 03/16/20   [provider]  LANTUS SOLOSTAR 100 UNIT/ML Solostar Pen Inject 12 Units into the skin at bedtime.    [provider]  mometasone [facility],  14 METERED DOSES,) 220 MCG/ACT inhaler Inhale 1 puff into the lungs daily. 05/27/23   Birder Robson, MD  naproxen (NAPROSYN) 375 MG tablet Take 1 tablet (375 mg total) by mouth 2 (two) times daily with a meal. 07/06/23   Derwood Kaplan, MD  omeprazole (PRILOSEC) 20 MG capsule Take 1 capsule (20 mg total) by mouth daily as needed (for heartburn). 05/27/23   Birder Robson, MD  polyethylene glycol (MIRALAX / GLYCOLAX) 17 g packet Take 17 g by mouth 2 (two) times daily. 11/13/22   Glade Lloyd, MD  senna (SENOKOT) 8.6 MG TABS tablet Take 2 tablets (17.2 mg total) by mouth daily. 11/13/22   Glade Lloyd, MD  SURE COMFORT PEN NEEDLES 31G X 8 MM MISC SMARTSIG:Injection 11/21/20   [provider]  Vitamins-Lipotropics (LIPO FLAVONOID PLUS) TABS Take 1 tablet by mouth 2 (two) times daily.    [provider]   CT FEMUR RIGHT WO CONTRAST Result Date: 09/12/2023 CLINICAL DATA:  Upper leg trauma EXAM: CT OF THE LOWER RIGHT EXTREMITY WITHOUT CONTRAST TECHNIQUE: Multidetector CT imaging of the right lower extremity was performed according to the standard protocol. RADIATION DOSE REDUCTION: This exam was performed according to the departmental dose-optimization program which includes automated exposure control, adjustment of the mA and/or kV according to patient size and/or use of iterative reconstruction technique.  COMPARISON:  X-ray 09/12/2023 FINDINGS: Bones/Joint/Cartilage Acute comminuted, spiral oriented fracture of the distal femoral diaphysis. Mild-moderate anteromedial displacement and mild posterior apex angulation. 12.7 cm butterfly fragment with slight anteromedial displacement. Fracture does not extend intra-articularly to involve the knee joint. No lytic or sclerotic changes to the bone to suggest a pathologic fracture. The bones are demineralized. Hip joint intact with mild to moderate osteoarthritis. No hip joint effusion. Severe tricompartmental osteoarthritis of the right knee with bone-on-bone apposition in the medial compartment. Small-moderate size knee joint effusion. Ligaments Suboptimally assessed by CT. Muscles and Tendons Ill-defined hematoma within the anterior compartment musculature of the mid to distal right thigh. Soft tissues Soft tissue swelling at the fracture site. No right inguinal lymphadenopathy. IMPRESSION: 1. Acute comminuted, spiral-oriented fracture of the distal femoral diaphysis, as described. 2. Ill-defined hematoma within the anterior compartment musculature of the mid to distal right thigh. 3. Severe tricompartmental osteoarthritis of the right knee. Small-moderate size knee joint effusion. Electronically Signed   By: Duanne Guess D.O.   On: 09/12/2023 13:44   DG Wrist Complete Left Result Date: 09/12/2023 CLINICAL DATA:  Fall, left wrist pain EXAM: LEFT WRIST - COMPLETE 3+ VIEW COMPARISON:  None Available. FINDINGS: Bony ridging along the lateral and dorsal distal radial metaphysis, possibly spurring or deformity from an old injury, difficult to exclude a buckle type fracture. Mild soft tissue swelling overlying the distal ulna, questionable nondisplaced transverse fracture of the ulnar styloid on the oblique projection. Advanced osteoarthritis at the first carpometacarpal articulation. Faint calcifications noted along the intrinsic interosseous ligaments but not along the  TFCC disc. IMPRESSION: 1. Bony ridging along the lateral and dorsal distal radial metaphysis, possibly spurring or deformity from an old injury, difficult to exclude a buckle type fracture. 2. Questionable nondisplaced transverse fracture of the ulnar styloid. 3. Advanced osteoarthritis at the first carpometacarpal articulation. Electronically Signed   By: Gaylyn Rong M.D.   On: 09/12/2023 12:34   DG Knee Complete 4 Views Right Result Date: 09/12/2023 CLINICAL DATA:  Fall, knee pain EXAM: RIGHT KNEE - COMPLETE 4+ VIEW COMPARISON:  None Available. FINDINGS: Spiral fracture of the distal  fibular metadiaphysis. Mild distal comminution. 2.0 cm posteromedial displacement of the distal fragment. Prominent osteoarthritis in the knee.  Meniscal chondrocalcinosis. IMPRESSION: 1. Spiral fracture of the distal fibular metadiaphysis with mild distal comminution. 2. Prominent osteoarthritis in the knee. 3. Meniscal chondrocalcinosis. Electronically Signed   By: Gaylyn Rong M.D.   On: 09/12/2023 12:32   DG Pelvis 1-2 Views Result Date: 09/12/2023 CLINICAL DATA:  Fall, right hip pain EXAM: PELVIS - 1-2 VIEW COMPARISON:  CT pelvis 11/03/2022 FINDINGS: Bony demineralization. Postoperative findings in the lower lumbar spine. Both hips are somewhat externally rotated. No definite fracture identified. IMPRESSION: 1. No definite fracture identified. 2. Bony demineralization. 3. Postoperative findings in the lower lumbar spine. Electronically Signed   By: Gaylyn Rong M.D.   On: 09/12/2023 12:30    Positive ROS: All other systems have been reviewed and were otherwise negative with the exception of those mentioned in the HPI and as above.  Objective: Labs cbc Recent Labs    09/12/23 1114  WBC 12.0*  HGB 13.0  HCT 40.9  PLT 221    Labs inflam No results for input(s): "CRP" in the last 72 hours.  Invalid input(s): "ESR"  Labs coag Recent Labs    09/12/23 1114  INR 1.0    Recent Labs     09/12/23 1114  NA 140  K 3.5  CL 107  CO2 18*  GLUCOSE 169*  BUN 15  CREATININE 0.69  CALCIUM 9.5    Physical Exam: Vitals:   09/12/23 1415 09/12/23 1430  BP: (!) 164/77 (!) 158/68  Pulse: 89 83  Resp: 20 18  Temp:    SpO2: 99% 92%   General: Alert, no acute distress.  Laying supine on stretcher, calm, but obvious discomfort Mental status: Alert and Oriented x3 Neurologic: Speech Clear and organized, no gross focal findings or movement disorder appreciated. Respiratory: No cyanosis, no use of accessory musculature Cardiovascular: No pedal edema GI: Abdomen is soft and non-tender, non-distended. Skin: Warm and dry.  No lesions in the areas of chief complaint Extremities: Warm and well perfused w/o edema Psychiatric: Patient is competent for consent with normal mood and affect  MUSCULOSKELETAL:  L wrist brace removed, mildly TTP on radial side, wrist and hand ROM limited d/t pain, no edema or ecchymosis seen, able to move fingers, NVI  R leg in KI, TTP distal thigh, edema and warmth present, no open wounds or skin tenting, minimal to no ROM at hip or knee tolerated d/t pain, ROM ankle and foot intact, ankle edema at baseline, NVI  Other extremities are atraumatic with painless ROM and NVI.  Assessment / Plan: Principal Problem:   Femur fracture, right (HCC) Active Problems:   Essential hypertension   GERD (gastroesophageal reflux disease)   Spinal stenosis   Hypercholesterolemia   Lumbago of lumbar region with sciatica   Mild persistent asthma, uncomplicated   Type 2 diabetes mellitus with other specified complication (HCC)   I don't see any fracture in the left wrist imaging. Will treat as a wrist sprain and aggravation of existing OA. Can WBAT in brace. Ice and elevate for pain. PT/OT as tolerated.  Right distal femur fracture will do best with surgery vs non-operative management that would require 6 weeks of bed rest and increased risks of DVT, PNA, and pressure  sores. After explaining the risks and benefits of surgery, she wishes to proceed with operative fixation tomorrow. Plan for ORIF. Keep NWB in KI for now. NPO at midnight.  Weightbearing: NWB RLE Orthopedic device(s):  wrist brace LUE and KI RLE Showering: hold for now VTE prophylaxis:  on hold for surgery     Pain control: PRN per primary, limit narcotics due to age and delirium risk Follow - up plan: 2 weeks postop Contact information:  Margarita Rana MD, [facility] PA-C  Jenne Pane PA-C Office [phone] 09/12/2023 3:02 PM

## 2023-09-13 ENCOUNTER — Other Ambulatory Visit: Payer: Self-pay

## 2023-09-13 ENCOUNTER — Encounter (HOSPITAL_COMMUNITY): Payer: Self-pay | Admitting: Internal Medicine

## 2023-09-13 ENCOUNTER — Inpatient Hospital Stay (HOSPITAL_COMMUNITY): Payer: Medicare Other

## 2023-09-13 ENCOUNTER — Inpatient Hospital Stay (HOSPITAL_COMMUNITY): Payer: Medicare Other | Admitting: Anesthesiology

## 2023-09-13 ENCOUNTER — Encounter (HOSPITAL_COMMUNITY)
Admission: EM | Disposition: A | Discharge status: Skilled Nursing Facility | Payer: Self-pay | Source: Home / Self Care | Attending: Family Medicine

## 2023-09-13 DIAGNOSIS — S72451A Displaced supracondylar fracture without intracondylar extension of lower end of right femur, initial encounter for closed fracture: Secondary | ICD-10-CM | POA: Diagnosis not present

## 2023-09-13 DIAGNOSIS — S72491A Other fracture of lower end of right femur, initial encounter for closed fracture: Secondary | ICD-10-CM | POA: Diagnosis not present

## 2023-09-13 HISTORY — PX: ORIF FEMUR FRACTURE: SHX2119

## 2023-09-13 LAB — BASIC METABOLIC PANEL
Anion gap: 12 (ref 5–15)
BUN: 10 mg/dL (ref 8–23)
CO2: 18 mmol/L — ABNORMAL LOW (ref 22–32)
Calcium: 9 mg/dL (ref 8.9–10.3)
Chloride: 108 mmol/L (ref 98–111)
Creatinine, Ser: 0.68 mg/dL (ref 0.44–1.00)
GFR, Estimated: >60 mL/min (ref >60)
Glucose, Bld: 180 mg/dL — ABNORMAL HIGH (ref 70–99)
Potassium: 3.8 mmol/L (ref 3.5–5.1)
Sodium: 138 mmol/L (ref 135–145)

## 2023-09-13 LAB — CBC
HCT: 35.1 % — ABNORMAL LOW (ref 36.0–46.0)
HCT: 35 % — ABNORMAL LOW (ref 36.0–46.0)
Hemoglobin: 11.4 g/dL — ABNORMAL LOW (ref 12.0–15.0)
Hemoglobin: 11.2 g/dL — ABNORMAL LOW (ref 12.0–15.0)
MCH: 30.1 pg (ref 26.0–34.0)
MCH: 30.1 pg (ref 26.0–34.0)
MCHC: 32.5 g/dL (ref 30.0–36.0)
MCHC: 32 g/dL (ref 30.0–36.0)
MCV: 92.6 fL (ref 80.0–100.0)
MCV: 94.1 fL (ref 80.0–100.0)
Platelets: 248 10*3/uL (ref 150–400)
Platelets: 171 10*3/uL (ref 150–400)
RBC: 3.79 MIL/uL — ABNORMAL LOW (ref 3.87–5.11)
RBC: 3.72 MIL/uL — ABNORMAL LOW (ref 3.87–5.11)
RDW: 13.9 % (ref 11.5–15.5)
RDW: 13.7 % (ref 11.5–15.5)
WBC: 13.1 10*3/uL — ABNORMAL HIGH (ref 4.0–10.5)
WBC: 14.8 10*3/uL — ABNORMAL HIGH (ref 4.0–10.5)
nRBC: 0 % (ref 0.0–0.2)
nRBC: 0 % (ref 0.0–0.2)

## 2023-09-13 LAB — SURGICAL PCR SCREEN
MRSA, PCR: NEGATIVE
Staphylococcus aureus: NEGATIVE

## 2023-09-13 LAB — GLUCOSE, CAPILLARY
Glucose-Capillary: 167 mg/dL — ABNORMAL HIGH (ref 70–99)
Glucose-Capillary: 152 mg/dL — ABNORMAL HIGH (ref 70–99)
Glucose-Capillary: 171 mg/dL — ABNORMAL HIGH (ref 70–99)
Glucose-Capillary: 194 mg/dL — ABNORMAL HIGH (ref 70–99)
Glucose-Capillary: 197 mg/dL — ABNORMAL HIGH (ref 70–99)

## 2023-09-13 LAB — CREATININE, SERUM
Creatinine, Ser: 0.68 mg/dL (ref 0.44–1.00)
GFR, Estimated: >60 mL/min (ref >60)

## 2023-09-13 SURGERY — OPEN REDUCTION INTERNAL FIXATION (ORIF) DISTAL FEMUR FRACTURE
Anesthesia: General | Site: Leg Upper | Laterality: Right

## 2023-09-13 MED ORDER — 0.9 % SODIUM CHLORIDE (POUR BTL) OPTIME
TOPICAL | Status: DC | PRN
  Administered 2023-09-13: 1000 mL

## 2023-09-13 MED ORDER — ORAL CARE MOUTH RINSE: 15.0000 mL | Freq: Once | OROMUCOSAL | Status: AC

## 2023-09-13 MED ORDER — PANTOPRAZOLE SODIUM 40 MG PO TBEC: 40.0000 mg | DELAYED_RELEASE_TABLET | Freq: Every day | ORAL | Status: DC

## 2023-09-13 MED ORDER — ACETAMINOPHEN 160 MG/5ML PO SOLN: 1000.0000 mg | Freq: Once | ORAL | Status: DC | PRN

## 2023-09-13 MED ORDER — CHLORHEXIDINE GLUCONATE 0.12 % MT SOLN
OROMUCOSAL | Status: AC
  Administered 2023-09-13: 15 mL via OROMUCOSAL
  Filled 2023-09-13: qty 15

## 2023-09-13 MED ORDER — ROCURONIUM BROMIDE 10 MG/ML (PF) SYRINGE
PREFILLED_SYRINGE | INTRAVENOUS | Status: DC | PRN
  Administered 2023-09-13: 40 mg via INTRAVENOUS

## 2023-09-13 MED ORDER — DEXAMETHASONE SODIUM PHOSPHATE 10 MG/ML IJ SOLN
INTRAMUSCULAR | Status: DC | PRN
  Administered 2023-09-13: 10 mg via INTRAVENOUS

## 2023-09-13 MED ORDER — CEFAZOLIN SODIUM-DEXTROSE 2-4 GM/100ML-% IV SOLN
2.0000 g | Freq: Four times a day (QID) | INTRAVENOUS | Status: AC
  Administered 2023-09-13 (×2): 2 g via INTRAVENOUS
  Filled 2023-09-13 (×2): qty 100

## 2023-09-13 MED ORDER — BISACODYL 10 MG RE SUPP: 10.0000 mg | Freq: Every day | RECTAL | Status: DC | PRN

## 2023-09-13 MED ORDER — MIDAZOLAM HCL 2 MG/2ML IJ SOLN
INTRAMUSCULAR | Status: DC | PRN
  Administered 2023-09-13: 2 mg via INTRAVENOUS

## 2023-09-13 MED ORDER — METOCLOPRAMIDE HCL 5 MG/ML IJ SOLN: 5.0000 mg | Freq: Three times a day (TID) | INTRAMUSCULAR | Status: DC | PRN

## 2023-09-13 MED ORDER — ACETAMINOPHEN 325 MG PO TABS
325.0000 mg | ORAL_TABLET | Freq: Four times a day (QID) | ORAL | Status: DC | PRN
Start: 2023-09-14 — End: 2023-09-18
  Administered 2023-09-16 – 2023-09-17 (×2): 650 mg via ORAL
  Filled 2023-09-13 (×2): qty 2

## 2023-09-13 MED ORDER — ALBUMIN HUMAN 5 % IV SOLN: INTRAVENOUS | Status: DC | PRN

## 2023-09-13 MED ORDER — BUPIVACAINE HCL (PF) 0.25 % IJ SOLN
INTRAMUSCULAR | Status: AC
  Filled 2023-09-13: qty 30

## 2023-09-13 MED ORDER — HYDROCODONE-ACETAMINOPHEN 5-325 MG PO TABS
1.0000 | ORAL_TABLET | ORAL | Status: DC | PRN
  Administered 2023-09-15: 1 via ORAL
  Administered 2023-09-16: 2 via ORAL
  Filled 2023-09-13 (×2): qty 2

## 2023-09-13 MED ORDER — FENTANYL CITRATE (PF) 250 MCG/5ML IJ SOLN
INTRAMUSCULAR | Status: AC
  Filled 2023-09-13: qty 5

## 2023-09-13 MED ORDER — CEFAZOLIN SODIUM-DEXTROSE 2-4 GM/100ML-% IV SOLN
2.0000 g | INTRAVENOUS | Status: AC
  Administered 2023-09-13: 2 g via INTRAVENOUS

## 2023-09-13 MED ORDER — HYDROMORPHONE HCL 1 MG/ML IJ SOLN
INTRAMUSCULAR | Status: AC
  Administered 2023-09-13: 0.25 mg via INTRAVENOUS
  Filled 2023-09-13: qty 1

## 2023-09-13 MED ORDER — METHOCARBAMOL 1000 MG/10ML IJ SOLN
500.0000 mg | Freq: Four times a day (QID) | INTRAMUSCULAR | Status: DC | PRN
  Administered 2023-09-14: 500 mg via INTRAVENOUS
  Filled 2023-09-13: qty 10

## 2023-09-13 MED ORDER — FENTANYL CITRATE (PF) 250 MCG/5ML IJ SOLN
INTRAMUSCULAR | Status: DC | PRN
  Administered 2023-09-13: 100 ug via INTRAVENOUS

## 2023-09-13 MED ORDER — PHENYLEPHRINE 80 MCG/ML (10ML) SYRINGE FOR IV PUSH (FOR BLOOD PRESSURE SUPPORT)
PREFILLED_SYRINGE | INTRAVENOUS | Status: DC | PRN
  Administered 2023-09-13 (×3): 160 ug via INTRAVENOUS
  Administered 2023-09-13: 40 ug via INTRAVENOUS

## 2023-09-13 MED ORDER — MORPHINE SULFATE (PF) 2 MG/ML IV SOLN
0.5000 mg | INTRAVENOUS | Status: DC | PRN
  Administered 2023-09-13: 0.5 mg via INTRAVENOUS
  Administered 2023-09-13 – 2023-09-14 (×3): 1 mg via INTRAVENOUS
  Filled 2023-09-13 (×4): qty 1

## 2023-09-13 MED ORDER — ENOXAPARIN SODIUM 40 MG/0.4ML IJ SOSY
40.0000 mg | PREFILLED_SYRINGE | INTRAMUSCULAR | Status: DC
  Administered 2023-09-14 – 2023-09-17 (×4): 40 mg via SUBCUTANEOUS
  Filled 2023-09-13 (×4): qty 0.4

## 2023-09-13 MED ORDER — LACTATED RINGERS IV SOLN: INTRAVENOUS | Status: DC

## 2023-09-13 MED ORDER — TRANEXAMIC ACID-NACL 1000-0.7 MG/100ML-% IV SOLN
INTRAVENOUS | Status: AC
  Filled 2023-09-13: qty 100

## 2023-09-13 MED ORDER — DOCUSATE SODIUM 100 MG PO CAPS
100.0000 mg | ORAL_CAPSULE | Freq: Two times a day (BID) | ORAL | Status: DC
  Administered 2023-09-13 – 2023-09-17 (×8): 100 mg via ORAL
  Filled 2023-09-13 (×9): qty 1

## 2023-09-13 MED ORDER — METHOCARBAMOL 500 MG PO TABS
500.0000 mg | ORAL_TABLET | Freq: Four times a day (QID) | ORAL | Status: DC | PRN
  Administered 2023-09-14 – 2023-09-16 (×2): 500 mg via ORAL
  Filled 2023-09-13 (×2): qty 1

## 2023-09-13 MED ORDER — POVIDONE-IODINE 10 % EX SWAB
2.0000 | Freq: Once | CUTANEOUS | Status: AC
  Administered 2023-09-13: 2 via TOPICAL

## 2023-09-13 MED ORDER — SUGAMMADEX SODIUM 200 MG/2ML IV SOLN
INTRAVENOUS | Status: DC | PRN
  Administered 2023-09-13: 200 mg via INTRAVENOUS

## 2023-09-13 MED ORDER — MIDAZOLAM HCL 2 MG/2ML IJ SOLN
INTRAMUSCULAR | Status: AC
Start: 2023-09-13 — End: ?
  Filled 2023-09-13: qty 2

## 2023-09-13 MED ORDER — OXYCODONE HCL 5 MG PO TABS: 5.0000 mg | ORAL_TABLET | Freq: Once | ORAL | Status: DC | PRN

## 2023-09-13 MED ORDER — LIDOCAINE 2% (20 MG/ML) 5 ML SYRINGE
INTRAMUSCULAR | Status: DC | PRN
  Administered 2023-09-13: 50 mg via INTRAVENOUS

## 2023-09-13 MED ORDER — DEXAMETHASONE SODIUM PHOSPHATE 10 MG/ML IJ SOLN: 8.0000 mg | Freq: Once | INTRAMUSCULAR | Status: DC

## 2023-09-13 MED ORDER — PROPOFOL 10 MG/ML IV BOLUS
INTRAVENOUS | Status: AC
  Filled 2023-09-13: qty 20

## 2023-09-13 MED ORDER — TRANEXAMIC ACID-NACL 1000-0.7 MG/100ML-% IV SOLN
1000.0000 mg | Freq: Once | INTRAVENOUS | Status: AC
  Administered 2023-09-13: 1000 mg via INTRAVENOUS
  Filled 2023-09-13: qty 100

## 2023-09-13 MED ORDER — OXYCODONE HCL 5 MG/5ML PO SOLN: 5.0000 mg | Freq: Once | ORAL | Status: DC | PRN

## 2023-09-13 MED ORDER — INSULIN ASPART 100 UNIT/ML IJ SOLN: 0.0000 [IU] | INTRAMUSCULAR | Status: DC | PRN

## 2023-09-13 MED ORDER — ONDANSETRON HCL 4 MG/2ML IJ SOLN
INTRAMUSCULAR | Status: DC | PRN
  Administered 2023-09-13: 4 mg via INTRAVENOUS

## 2023-09-13 MED ORDER — ACETAMINOPHEN 500 MG PO TABS
500.0000 mg | ORAL_TABLET | Freq: Four times a day (QID) | ORAL | Status: AC
  Administered 2023-09-13 – 2023-09-14 (×3): 500 mg via ORAL
  Filled 2023-09-13 (×2): qty 1

## 2023-09-13 MED ORDER — PROPOFOL 10 MG/ML IV BOLUS
INTRAVENOUS | Status: DC | PRN
  Administered 2023-09-13: 90 mg via INTRAVENOUS

## 2023-09-13 MED ORDER — ACETAMINOPHEN 500 MG PO TABS
1000.0000 mg | ORAL_TABLET | Freq: Once | ORAL | Status: DC
  Filled 2023-09-13: qty 2

## 2023-09-13 MED ORDER — ONDANSETRON HCL 4 MG PO TABS: 4.0000 mg | ORAL_TABLET | Freq: Four times a day (QID) | ORAL | Status: DC | PRN

## 2023-09-13 MED ORDER — ACETAMINOPHEN 10 MG/ML IV SOLN: 1000.0000 mg | Freq: Once | INTRAVENOUS | Status: DC | PRN

## 2023-09-13 MED ORDER — PHENOL 1.4 % MT LIQD: 1.0000 | OROMUCOSAL | Status: DC | PRN

## 2023-09-13 MED ORDER — ONDANSETRON HCL 4 MG/2ML IJ SOLN: 4.0000 mg | Freq: Four times a day (QID) | INTRAMUSCULAR | Status: DC | PRN

## 2023-09-13 MED ORDER — ALUM & MAG HYDROXIDE-SIMETH 200-200-20 MG/5ML PO SUSP: 30.0000 mL | ORAL | Status: DC | PRN

## 2023-09-13 MED ORDER — HYDROMORPHONE HCL 1 MG/ML IJ SOLN
0.2500 mg | INTRAMUSCULAR | Status: DC | PRN
  Administered 2023-09-13: 0.25 mg via INTRAVENOUS

## 2023-09-13 MED ORDER — DEXAMETHASONE SODIUM PHOSPHATE 10 MG/ML IJ SOLN
INTRAMUSCULAR | Status: AC
  Filled 2023-09-13: qty 1

## 2023-09-13 MED ORDER — ACETAMINOPHEN 500 MG PO TABS: 1000.0000 mg | ORAL_TABLET | Freq: Once | ORAL | Status: DC | PRN

## 2023-09-13 MED ORDER — TRANEXAMIC ACID-NACL 1000-0.7 MG/100ML-% IV SOLN
1000.0000 mg | INTRAVENOUS | Status: AC
  Administered 2023-09-13: 1000 mg via INTRAVENOUS

## 2023-09-13 MED ORDER — HYDROCODONE-ACETAMINOPHEN 7.5-325 MG PO TABS
1.0000 | ORAL_TABLET | ORAL | Status: DC | PRN
  Administered 2023-09-13: 1 via ORAL
  Administered 2023-09-14 (×2): 2 via ORAL
  Administered 2023-09-14: 1 via ORAL
  Administered 2023-09-14 – 2023-09-16 (×5): 2 via ORAL
  Administered 2023-09-17: 1 via ORAL
  Filled 2023-09-13 (×3): qty 1
  Filled 2023-09-13 (×7): qty 2

## 2023-09-13 MED ORDER — METOCLOPRAMIDE HCL 5 MG PO TABS
5.0000 mg | ORAL_TABLET | Freq: Three times a day (TID) | ORAL | Status: DC | PRN
Start: 2023-09-13 — End: 2023-09-18

## 2023-09-13 MED ORDER — PHENYLEPHRINE HCL-NACL 20-0.9 MG/250ML-% IV SOLN
INTRAVENOUS | Status: DC | PRN
  Administered 2023-09-13: 55 ug/min via INTRAVENOUS

## 2023-09-13 MED ORDER — CEFAZOLIN SODIUM-DEXTROSE 2-4 GM/100ML-% IV SOLN
INTRAVENOUS | Status: AC
  Filled 2023-09-13: qty 100

## 2023-09-13 MED ORDER — CHLORHEXIDINE GLUCONATE 0.12 % MT SOLN: 15.0000 mL | Freq: Once | OROMUCOSAL | Status: AC

## 2023-09-13 MED ORDER — HYDROMORPHONE HCL 1 MG/ML IJ SOLN
0.5000 mg | Freq: Once | INTRAMUSCULAR | Status: AC
  Administered 2023-09-13: 0.5 mg via INTRAVENOUS

## 2023-09-13 MED ORDER — MENTHOL 3 MG MT LOZG: 1.0000 | LOZENGE | OROMUCOSAL | Status: DC | PRN

## 2023-09-13 SURGICAL SUPPLY — 65 items
BAG COUNTER SPONGE SURGICOUNT (BAG) ×1 IMPLANT
BIT DRILL 2.5X2.75 QC CALB (BIT) IMPLANT
BIT DRILL 2.9X70 QC CALB (BIT) IMPLANT
BIT DRILL 3.5X5.5 QC CALB (BIT) IMPLANT
BIT DRILL 4.3 (BIT) IMPLANT
BIT DRILL 4X70 QC CALB QC (BIT) IMPLANT
BIT DRILL QC 3.3X195 (BIT) IMPLANT
BLADE CLIPPER SURG (BLADE) IMPLANT
BNDG ELASTIC 4INX 5YD STR LF (GAUZE/BANDAGES/DRESSINGS) IMPLANT
BNDG ELASTIC 4X5.8 VLCR STR LF (GAUZE/BANDAGES/DRESSINGS) ×1 IMPLANT
BNDG ELASTIC 6INX 5YD STR LF (GAUZE/BANDAGES/DRESSINGS) IMPLANT
BNDG ELASTIC 6X5.8 VLCR STR LF (GAUZE/BANDAGES/DRESSINGS) ×1 IMPLANT
BNDG GAUZE DERMACEA FLUFF 4 (GAUZE/BANDAGES/DRESSINGS) ×1 IMPLANT
CAP LOCK NCB (Cap) IMPLANT
DRAPE C-ARM 42X72 X-RAY (DRAPES) ×1 IMPLANT
DRAPE C-ARMOR (DRAPES) ×1 IMPLANT
DRAPE IMP U-DRAPE 54X76 (DRAPES) ×1 IMPLANT
DRAPE SURG ORHT 6 SPLT 77X108 (DRAPES) ×2 IMPLANT
DRAPE U-SHAPE 47X51 STRL (DRAPES) ×1 IMPLANT
DRSG ADAPTIC 3X8 NADH LF (GAUZE/BANDAGES/DRESSINGS) ×1 IMPLANT
ELECT REM PT RETURN 9FT ADLT (ELECTROSURGICAL) ×1
ELECTRODE REM PT RTRN 9FT ADLT (ELECTROSURGICAL) ×1 IMPLANT
GAUZE PAD ABD 8X10 STRL (GAUZE/BANDAGES/DRESSINGS) ×4 IMPLANT
GAUZE SPONGE 4X4 12PLY STRL (GAUZE/BANDAGES/DRESSINGS) ×1 IMPLANT
GLOVE BIO SURGEON STRL SZ7.5 (GLOVE) ×1 IMPLANT
GLOVE BIOGEL PI IND STRL 7.5 (GLOVE) ×1 IMPLANT
GLOVE BIOGEL PI IND STRL 8 (GLOVE) ×1 IMPLANT
GLOVE SURG SYN 7.5 E (GLOVE) ×1 IMPLANT
GLOVE SURG SYN 7.5 PF PI (GLOVE) ×1 IMPLANT
GOWN STRL REUS W/ TWL LRG LVL3 (GOWN DISPOSABLE) ×1 IMPLANT
GOWN STRL REUS W/ TWL XL LVL3 (GOWN DISPOSABLE) ×2 IMPLANT
K-WIRE FXSTD 280X2XNS SS (WIRE) ×3
KIT BASIN OR (CUSTOM PROCEDURE TRAY) ×1 IMPLANT
KIT TURNOVER KIT B (KITS) ×1 IMPLANT
KWIRE FXSTD 280X2XNS SS (WIRE) IMPLANT
MANIFOLD NEPTUNE II (INSTRUMENTS) ×1 IMPLANT
NS IRRIG 1000ML POUR BTL (IV SOLUTION) ×1 IMPLANT
PACK TOTAL JOINT (CUSTOM PROCEDURE TRAY) ×1 IMPLANT
PACK UNIVERSAL I (CUSTOM PROCEDURE TRAY) ×1 IMPLANT
PAD ABD 8X10 STRL (GAUZE/BANDAGES/DRESSINGS) IMPLANT
PAD ARMBOARD 7.5X6 YLW CONV (MISCELLANEOUS) ×2 IMPLANT
PAD CAST 4YDX4 CTTN HI CHSV (CAST SUPPLIES) ×1 IMPLANT
PADDING CAST COTTON 6X4 STRL (CAST SUPPLIES) ×1 IMPLANT
PLATE FEM DIST NCB PP 278MM (Plate) IMPLANT
SCREW 5.0 32MM (Screw) IMPLANT
SCREW 5.0 48MM (Screw) IMPLANT
SCREW CANC FT 4X34 (Screw) IMPLANT
SCREW CORT NCB SELFTAP 5.0X50 (Screw) IMPLANT
SCREW CORTICAL 3.5MM 36MM (Screw) IMPLANT
SCREW CORTICAL NCB 5.0X65 (Screw) IMPLANT
SCREW LAG CANC FT 4X42 (Screw) IMPLANT
SCREW NCB 5.0X30MM (Screw) IMPLANT
SCREW NCB 5.0X34MM (Screw) IMPLANT
SCREW NCB 5.0X38 (Screw) IMPLANT
SCREW NCB 5.0X55MM (Screw) IMPLANT
SCREW NLOCK CANC HEX 4X32 (Screw) IMPLANT
STAPLER VISISTAT 35W (STAPLE) IMPLANT
SUT MNCRL AB 4-0 PS2 18 (SUTURE) ×1 IMPLANT
SUT MON AB 2-0 CT1 27 (SUTURE) ×1 IMPLANT
SUT VIC AB 0 CT1 27XBRD ANBCTR (SUTURE) ×2 IMPLANT
TOWEL GREEN STERILE (TOWEL DISPOSABLE) ×2 IMPLANT
TOWEL GREEN STERILE FF (TOWEL DISPOSABLE) ×1 IMPLANT
TOWEL OR NON WOVEN STRL DISP B (DISPOSABLE) ×1 IMPLANT
TRAY FOLEY MTR SLVR 16FR STAT (SET/KITS/TRAYS/PACK) IMPLANT
WATER STERILE IRR 1000ML POUR (IV SOLUTION) ×2 IMPLANT

## 2023-09-13 NOTE — Anesthesia Postprocedure Evaluation (Signed)
Anesthesia Post Note  Patient: Rhonda Watkins  Procedure(s) Performed: OPEN REDUCTION INTERNAL FIXATION (ORIF) DISTAL FEMUR FRACTURE (Right: Leg Upper)     Patient location during evaluation: PACU Anesthesia Type: General Level of consciousness: awake and patient cooperative Pain management: pain level controlled Vital Signs Assessment: post-procedure vital signs reviewed and stable Respiratory status: spontaneous breathing, nonlabored ventilation and respiratory function stable Cardiovascular status: blood pressure returned to baseline and stable Postop Assessment: no apparent nausea or vomiting Anesthetic complications: yes   Encounter Notable Events  Notable Event Outcome Phase Comment  Difficult to intubate - expected  Intraprocedure Filed from anesthesia note documentation.    Last Vitals:  Vitals:   09/13/23 1430 09/13/23 1445  BP: (!) 160/57 (!) 151/52  Pulse: 89 85  Resp: 16 13  Temp:    SpO2: 95% 96%    Last Pain:  Vitals:   09/13/23 1343  TempSrc:   PainSc: Asleep                 Prakriti Carignan

## 2023-09-13 NOTE — Hospital Course (Signed)
Rhonda Watkins is a 89 y.o. female with a history of hypertension, hyperlipidemia, GERD, spinal stenosis with chronic back pain, asthma.  Patient presented secondary to a fall and found to have a right femur fracture. Orthopedic surgery consulted for repair.

## 2023-09-13 NOTE — Anesthesia Preprocedure Evaluation (Signed)
Anesthesia Evaluation  Patient identified by MRN, date of birth, ID band Patient awake    Reviewed: Allergy & Precautions, NPO status , Patient's Chart, lab work & pertinent test results  History of Anesthesia Complications Negative for: history of anesthetic complications  Airway Mallampati: III  TM Distance: <3 FB Neck ROM: Full    Dental  (+) Teeth Intact, Dental Advisory Given   Pulmonary asthma , neg sleep apnea, neg COPD, neg recent URI, former smoker   breath sounds clear to auscultation       Cardiovascular hypertension, (-) angina  Rhythm:Regular     Neuro/Psych neg Seizures    GI/Hepatic ,GERD  Medicated,,  Endo/Other  diabetes    Renal/GU Lab Results      Component                Value               Date                      NA                       138                 09/13/2023                K                        3.8                 09/13/2023                CO2                      18 (L)              09/13/2023                GLUCOSE                  180 (H)             09/13/2023                BUN                      10                  09/13/2023                CREATININE               0.68                09/13/2023                CALCIUM                  9.0                 09/13/2023                GFRNONAA                 >60                 09/13/2023  Musculoskeletal  (+) Arthritis ,  Right Distal Femur fracture   Abdominal   Peds  Hematology  (+) Blood dyscrasia, anemia Lab Results      Component                Value               Date                      WBC                      13.1 (H)            09/13/2023                HGB                      11.4 (L)            09/13/2023                HCT                      35.1 (L)            09/13/2023                MCV                      92.6                09/13/2023                PLT                      248                  09/13/2023              Anesthesia Other Findings   Reproductive/Obstetrics                              Anesthesia Physical Anesthesia Plan  ASA: 2  Anesthesia Plan: General   Post-op Pain Management: Ofirmev IV (intra-op)*   Induction: Intravenous  PONV Risk Score and Plan: 4 or greater and Ondansetron and Dexamethasone  Airway Management Planned: Oral ETT and Video Laryngoscope Planned  Additional Equipment: None  Intra-op Plan:   Post-operative Plan: Extubation in OR  Informed Consent: I have reviewed the patients History and Physical, chart, labs and discussed the procedure including the risks, benefits and alternatives for the proposed anesthesia with the patient or authorized representative who has indicated his/her understanding and acceptance.     Dental advisory given  Plan Discussed with: CRNA  Anesthesia Plan Comments:          Anesthesia Quick Evaluation

## 2023-09-13 NOTE — Anesthesia Procedure Notes (Signed)
Procedure Name: Intubation Date/Time: 09/13/2023 11:22 AM  Performed by: Gloris Ham, CRNAPre-anesthesia Checklist: Patient identified, Emergency Drugs available, Suction available and Patient being monitored Patient Re-evaluated:Patient Re-evaluated prior to induction Oxygen Delivery Method: Circle System Utilized Preoxygenation: Pre-oxygenation with 100% oxygen Induction Type: IV induction Ventilation: Mask ventilation without difficulty Laryngoscope Size: Miller, 3 and Glidescope Grade View: Grade III Tube type: Oral Tube size: 7.0 mm Number of attempts: 2 Airway Equipment and Method: Stylet and Oral airway Placement Confirmation: ETT inserted through vocal cords under direct vision, positive ETCO2 and breath sounds checked- equal and bilateral Secured at: 21 cm Tube secured with: Tape Dental Injury: Teeth and Oropharynx as per pre-operative assessment  Difficulty Due To: Difficulty was anticipated Comments: Pt very dry, unable to advance Miller 3 into position to raise Epiglottis, Glide scope was at bedside switched over to Glide Grade 1 view ETT passed with reg stylet.

## 2023-09-13 NOTE — Progress Notes (Signed)
PROGRESS NOTE    TYFFANI FOGLESONG  JTT:[id number] DOB: Feb 10, 1932 DOA: 09/12/2023 PCP: Emilio Aspen, MD   Brief Narrative: KATANYA SCHLIE is a 89 y.o. female with a history of hypertension, hyperlipidemia, GERD, spinal stenosis with chronic back pain, asthma.  Patient presented secondary to a fall and found to have a right femur fracture. Orthopedic surgery consulted for repair.   Assessment and Plan:  Right femur fracture Secondary to fall. CT evidence significant for an acute comminuted spiral-oriented fracture of the distal femoral diaphysis. Orthopedic surgery consulted and plan for surgical management. -Continue analgesics  Left wrist fracture Possible based on x-ray imaging; possibly an old injury. Splint placed. -Orthopedic surgery recommendations for management  Fall Reported as a mechanical fall. No mention of syncope.  Primary hypertension Patient is not on antihypertensive therapy as an outpatient.  Hyperlipidemia -Continue Lipitor  GERD -Continue Protonix  Diabetes mellitus type 2 Poorly controlled with hyperglycemia. Hemoglobin A1C of 8.3%. Patient is on Jardiance as an outpatient.  Spinal stenosis Chronic back pain Noted. -Continue analgesics  Asthma -Continue albuterol  Right knee osteoarthritis Severe disease with small-moderate effusion noted on imaging.   DVT prophylaxis: Per orthopedic surgery Code Status:   Code Status: Full Code Family Communication: None at bedside Disposition Plan: Discharge pending orthopedic surgery recommendations and eventual PT/OT recommendations   Consultants:  Orthopedic surgery  Procedures:  None  Antimicrobials: None    Subjective: Patient reports back pain. No other concerns expressed.  Objective: BP (!) 144/62   Pulse 95   Temp 99.3 F (37.4 C) (Oral)   Resp 18   Ht 5\' 2"  (1.575 m)   Wt 56.2 kg   SpO2 99%   BMI 22.66 kg/m   Examination:  General exam: Appears calm and  comfortable initially, but after being woken up, is uncomfortable from back pain Respiratory system: Clear to auscultation. Respiratory effort normal. Cardiovascular system: S1 & S2 heard, RRR. No murmurs. Gastrointestinal system: Abdomen is nondistended, soft and nontender. Normal bowel sounds heard. Central nervous system: Alert. Musculoskeletal: 2+ BLE pitting edema. No calf tenderness   Data Reviewed: I have personally reviewed following labs and imaging studies  CBC Lab Results  Component Value Date   WBC 13.1 (H) 09/13/2023   RBC 3.79 (L) 09/13/2023   HGB 11.4 (L) 09/13/2023   HCT 35.1 (L) 09/13/2023   MCV 92.6 09/13/2023   MCH 30.1 09/13/2023   PLT 248 09/13/2023   MCHC 32.5 09/13/2023   RDW 13.9 09/13/2023   LYMPHSABS 1.4 09/12/2023   MONOABS 1.1 (H) 09/12/2023   EOSABS 0.0 09/12/2023   BASOSABS 0.0 09/12/2023     Last metabolic panel Lab Results  Component Value Date   NA 138 09/13/2023   K 3.8 09/13/2023   CL 108 09/13/2023   CO2 18 (L) 09/13/2023   BUN 10 09/13/2023   CREATININE 0.68 09/13/2023   GLUCOSE 180 (H) 09/13/2023   GFRNONAA >60 09/13/2023   GFRAA >60 05/15/2020   CALCIUM 9.0 09/13/2023   PHOS 3.6 11/11/2022   PROT 4.9 (L) 11/10/2022   ALBUMIN 2.2 (L) 11/10/2022   BILITOT 0.5 11/10/2022   ALKPHOS 57 11/10/2022   AST 14 (L) 11/10/2022   ALT 12 11/10/2022   ANIONGAP 12 09/13/2023    GFR: Estimated Creatinine Clearance: 37.7 mL/min (by C-G formula based on SCr of 0.68 mg/dL).  Recent Results (from the past 240 hours)  Surgical PCR screen     Status: None   Collection Time: 09/12/23 10:29  PM   Specimen: Nasal Mucosa; Nasal Swab  Result Value Ref Range Status   MRSA, PCR NEGATIVE NEGATIVE Final   Staphylococcus aureus NEGATIVE NEGATIVE Final    Comment: (NOTE) The Xpert SA Assay (FDA approved for NASAL specimens in patients 69 years of age and older), is one component of a comprehensive surveillance program. It is not intended to  diagnose infection nor to guide or monitor treatment. Performed at [facility], [address] [place], [place] [address]       Radiology Studies: CT FEMUR RIGHT WO CONTRAST Result Date: 09/12/2023 CLINICAL DATA:  Upper leg trauma EXAM: CT OF THE LOWER RIGHT EXTREMITY WITHOUT CONTRAST TECHNIQUE: Multidetector CT imaging of the right lower extremity was performed according to the standard protocol. RADIATION DOSE REDUCTION: This exam was performed according to the departmental dose-optimization program which includes automated exposure control, adjustment of the mA and/or kV according to patient size and/or use of iterative reconstruction technique. COMPARISON:  X-ray 09/12/2023 FINDINGS: Bones/Joint/Cartilage Acute comminuted, spiral oriented fracture of the distal femoral diaphysis. Mild-moderate anteromedial displacement and mild posterior apex angulation. 12.7 cm butterfly fragment with slight anteromedial displacement. Fracture does not extend intra-articularly to involve the knee joint. No lytic or sclerotic changes to the bone to suggest a pathologic fracture. The bones are demineralized. Hip joint intact with mild to moderate osteoarthritis. No hip joint effusion. Severe tricompartmental osteoarthritis of the right knee with bone-on-bone apposition in the medial compartment. Small-moderate size knee joint effusion. Ligaments Suboptimally assessed by CT. Muscles and Tendons Ill-defined hematoma within the anterior compartment musculature of the mid to distal right thigh. Soft tissues Soft tissue swelling at the fracture site. No right inguinal lymphadenopathy. IMPRESSION: 1. Acute comminuted, spiral-oriented fracture of the distal femoral diaphysis, as described. 2. Ill-defined hematoma within the anterior compartment musculature of the mid to distal right thigh. 3. Severe tricompartmental osteoarthritis of the right knee. Small-moderate size knee joint effusion. Electronically Signed   By:  Duanne Guess D.O.   On: 09/12/2023 13:44   DG Wrist Complete Left Result Date: 09/12/2023 CLINICAL DATA:  Fall, left wrist pain EXAM: LEFT WRIST - COMPLETE 3+ VIEW COMPARISON:  None Available. FINDINGS: Bony ridging along the lateral and dorsal distal radial metaphysis, possibly spurring or deformity from an old injury, difficult to exclude a buckle type fracture. Mild soft tissue swelling overlying the distal ulna, questionable nondisplaced transverse fracture of the ulnar styloid on the oblique projection. Advanced osteoarthritis at the first carpometacarpal articulation. Faint calcifications noted along the intrinsic interosseous ligaments but not along the TFCC disc. IMPRESSION: 1. Bony ridging along the lateral and dorsal distal radial metaphysis, possibly spurring or deformity from an old injury, difficult to exclude a buckle type fracture. 2. Questionable nondisplaced transverse fracture of the ulnar styloid. 3. Advanced osteoarthritis at the first carpometacarpal articulation. Electronically Signed   By: Gaylyn Rong M.D.   On: 09/12/2023 12:34   DG Knee Complete 4 Views Right Result Date: 09/12/2023 CLINICAL DATA:  Fall, knee pain EXAM: RIGHT KNEE - COMPLETE 4+ VIEW COMPARISON:  None Available. FINDINGS: Spiral fracture of the distal fibular metadiaphysis. Mild distal comminution. 2.0 cm posteromedial displacement of the distal fragment. Prominent osteoarthritis in the knee.  Meniscal chondrocalcinosis. IMPRESSION: 1. Spiral fracture of the distal fibular metadiaphysis with mild distal comminution. 2. Prominent osteoarthritis in the knee. 3. Meniscal chondrocalcinosis. Electronically Signed   By: Gaylyn Rong M.D.   On: 09/12/2023 12:32   DG Pelvis 1-2 Views Result Date: 09/12/2023 CLINICAL  DATA:  Fall, right hip pain EXAM: PELVIS - 1-2 VIEW COMPARISON:  CT pelvis 11/03/2022 FINDINGS: Bony demineralization. Postoperative findings in the lower lumbar spine. Both hips are somewhat  externally rotated. No definite fracture identified. IMPRESSION: 1. No definite fracture identified. 2. Bony demineralization. 3. Postoperative findings in the lower lumbar spine. Electronically Signed   By: Gaylyn Rong M.D.   On: 09/12/2023 12:30      LOS: 1 day    Jacquelin Hawking, MD [facility] 09/13/2023, 9:44 AM   If 7PM-7AM, please contact night-coverage [email or web address]

## 2023-09-13 NOTE — Discharge Instructions (Signed)

## 2023-09-13 NOTE — Plan of Care (Signed)
  Problem: Education: Goal: Ability to describe self-care measures that may prevent or decrease complications (Diabetes Survival Skills Education) will improve Outcome: Progressing Goal: Individualized Educational Video(s) Outcome: Progressing   Problem: Coping: Goal: Ability to adjust to condition or change in health will improve Outcome: Progressing   Problem: Fluid Volume: Goal: Ability to maintain a balanced intake and output will improve Outcome: Progressing   Problem: Health Behavior/Discharge Planning: Goal: Ability to identify and utilize available resources and services will improve Outcome: Progressing Goal: Ability to manage health-related needs will improve Outcome: Progressing   Problem: Metabolic: Goal: Ability to maintain appropriate glucose levels will improve Outcome: Progressing   Problem: Nutritional: Goal: Maintenance of adequate nutrition will improve Outcome: Progressing Goal: Progress toward achieving an optimal weight will improve Outcome: Progressing   Problem: Skin Integrity: Goal: Risk for impaired skin integrity will decrease Outcome: Progressing   Problem: Tissue Perfusion: Goal: Adequacy of tissue perfusion will improve Outcome: Progressing   Problem: Education: Goal: Knowledge of General Education information will improve Description: Including pain rating scale, medication(s)/side effects and non-pharmacologic comfort measures Outcome: Progressing   Problem: Health Behavior/Discharge Planning: Goal: Ability to manage health-related needs will improve Outcome: Progressing   Problem: Clinical Measurements: Goal: Ability to maintain clinical measurements within normal limits will improve Outcome: Progressing Goal: Will remain free from infection Outcome: Progressing Goal: Diagnostic test results will improve Outcome: Progressing Goal: Respiratory complications will improve Outcome: Progressing Goal: Cardiovascular complication will  be avoided Outcome: Progressing   Problem: Activity: Goal: Risk for activity intolerance will decrease Outcome: Progressing   Problem: Nutrition: Goal: Adequate nutrition will be maintained Outcome: Progressing   Problem: Coping: Goal: Level of anxiety will decrease Outcome: Progressing   Problem: Elimination: Goal: Will not experience complications related to bowel motility Outcome: Progressing Goal: Will not experience complications related to urinary retention Outcome: Progressing   Problem: Pain Managment: Goal: General experience of comfort will improve and/or be controlled Outcome: Progressing   Problem: Safety: Goal: Ability to remain free from injury will improve Outcome: Progressing   Problem: Skin Integrity: Goal: Risk for impaired skin integrity will decrease Outcome: Progressing   Problem: Education: Goal: Verbalization of understanding the information provided (i.e., activity precautions, restrictions, etc) will improve Outcome: Progressing Goal: Individualized Educational Video(s) Outcome: Progressing   Problem: Activity: Goal: Ability to ambulate and perform ADLs will improve Outcome: Progressing   Problem: Clinical Measurements: Goal: Postoperative complications will be avoided or minimized Outcome: Progressing   Problem: Self-Concept: Goal: Ability to maintain and perform role responsibilities to the fullest extent possible will improve Outcome: Progressing   Problem: Pain Management: Goal: Pain level will decrease Outcome: Progressing

## 2023-09-13 NOTE — Op Note (Signed)
09/12/2023 - 09/13/2023  1:12 PM  PATIENT:  Rhonda Watkins    PRE-OPERATIVE DIAGNOSIS:  Right Distal Femur fracture  POST-OPERATIVE DIAGNOSIS:  Same  PROCEDURE:  OPEN REDUCTION INTERNAL FIXATION (ORIF) DISTAL FEMUR FRACTURE  SURGEON:  Sheral Apley, MD  ASSISTANT: Levester Fresh, PA-C, he was present and scrubbed throughout the case, critical for completion in a timely fashion, and for retraction, instrumentation, and closure.   ANESTHESIA:   gen  PREOPERATIVE INDICATIONS:  Rhonda Watkins is a  89 y.o. female with a diagnosis of Right Distal Femur fracture who failed conservative measures and elected for surgical management.    The risks benefits and alternatives were discussed with the patient preoperatively including but not limited to the risks of infection, bleeding, nerve injury, cardiopulmonary complications, the need for revision surgery, among others, and the patient was willing to proceed.  OPERATIVE IMPLANTS: Stryker distal femur locking plate  OPERATIVE FINDINGS: supracondylar fracture  BLOOD LOSS: 300  COMPLICATIONS: none  TOURNIQUET TIME: none  OPERATIVE PROCEDURE:  Patient was identified in the preoperative holding area and site was marked by me She was transported to the operating theater and placed on the table in lateral position taking care to pad all bony prominences. After a preincinduction time out anesthesia was induced. The right lower extremity was prepped and draped in normal sterile fashion and a pre-incision timeout was performed. She received ancef for preoperative antibiotics.   I made a lateral leg incision from the joint line superiorly. I carried my dissection down sharply to the IT band which was split in line with the incision. I then used a Cobb to elevate the muscle off of the lateral femur and the fracture was identified immediately.  I then debrided the fracture site and a performed a reduction maneuver I selected the appropriate length locking  plate for the distal femur and pinned this into place with a K wire holding the fracture reduced as well.  I then placed a combination of locking and nonlocking screws to help with fracture reduction and stabilization in the proximal aspect of the plate as well as the distal aspect of the plate. I uses many distal locking screws as possible. Care was taken to not penetrate the distal end of the bone.   Had good bites of my locking screws proximal bicortical screws had good bites the bone was soft distally.  I then took multiple fluoroscopic views and was happy with the reduction and placement of all hardware. I then thoroughly irrigated the wound and closed the IT band followed by the skin in layers with absorbable stitch.  POST OPERATIVE PLAN: Knee immobilizer full-time, nonweightbearing. dvt px: scd's/TED's, and mobilization and chemical px as indicated

## 2023-09-13 NOTE — Transfer of Care (Signed)
Immediate Anesthesia Transfer of Care Note  Patient: Rhonda Watkins  Procedure(s) Performed: OPEN REDUCTION INTERNAL FIXATION (ORIF) DISTAL FEMUR FRACTURE (Right: Leg Upper)  Patient Location: PACU  Anesthesia Type:General  Level of Consciousness: drowsy  Airway & Oxygen Therapy: Patient Spontanous Breathing and Patient connected to nasal cannula oxygen  Post-op Assessment: Report given to RN and Post -op Vital signs reviewed and stable  Post vital signs: Reviewed and stable  Last Vitals:  Vitals Value Taken Time  BP 151/51 09/13/23 1346  Temp 36.6 C 09/13/23 1343  Pulse 89 09/13/23 1358  Resp 14 09/13/23 1358  SpO2 97 % 09/13/23 1358  Vitals shown include unfiled device data.  Last Pain:  Vitals:   09/13/23 1343  TempSrc:   PainSc: Asleep         Complications:  Encounter Notable Events  Notable Event Outcome Phase Comment  Difficult to intubate - expected  Intraprocedure Filed from anesthesia note documentation.

## 2023-09-13 NOTE — Progress Notes (Signed)
RN received patient from PACU in stable condition. VS WDP. Patient is alert to voice, no pain. Ice pack applied to site, RN will continue to monitor.

## 2023-09-14 DIAGNOSIS — S72491A Other fracture of lower end of right femur, initial encounter for closed fracture: Secondary | ICD-10-CM | POA: Diagnosis not present

## 2023-09-14 LAB — GLUCOSE, CAPILLARY
Glucose-Capillary: 151 mg/dL — ABNORMAL HIGH (ref 70–99)
Glucose-Capillary: 168 mg/dL — ABNORMAL HIGH (ref 70–99)
Glucose-Capillary: 170 mg/dL — ABNORMAL HIGH (ref 70–99)
Glucose-Capillary: 139 mg/dL — ABNORMAL HIGH (ref 70–99)

## 2023-09-14 LAB — CBC
HCT: 31.1 % — ABNORMAL LOW (ref 36.0–46.0)
Hemoglobin: 10.1 g/dL — ABNORMAL LOW (ref 12.0–15.0)
MCH: 30 pg (ref 26.0–34.0)
MCHC: 32.5 g/dL (ref 30.0–36.0)
MCV: 92.3 fL (ref 80.0–100.0)
Platelets: 179 10*3/uL (ref 150–400)
RBC: 3.37 MIL/uL — ABNORMAL LOW (ref 3.87–5.11)
RDW: 13.6 % (ref 11.5–15.5)
WBC: 13.6 10*3/uL — ABNORMAL HIGH (ref 4.0–10.5)
nRBC: 0 % (ref 0.0–0.2)

## 2023-09-14 MED ORDER — GABAPENTIN 100 MG PO CAPS
100.0000 mg | ORAL_CAPSULE | Freq: Three times a day (TID) | ORAL | Status: DC
  Administered 2023-09-14 – 2023-09-17 (×10): 100 mg via ORAL
  Filled 2023-09-14 (×11): qty 1

## 2023-09-14 MED ORDER — CAMPHOR-MENTHOL 0.5-0.5 % EX LOTN: TOPICAL_LOTION | CUTANEOUS | Status: DC | PRN

## 2023-09-14 NOTE — Evaluation (Signed)
Physical Therapy Evaluation  Patient Details Name: Rhonda Watkins MRN: [id number] DOB: [birth year] Today's Date: 09/14/2023  History of Present Illness  Pt is an 89 y/o female who presents 09/12/2023 s/p fall. Pt sustained a R distal femur fracutre and L wrist fracture. She is now s/p ORIF femur on 09/13/2023 (NWB and K.I. at all times). PMH significant for HTN, spinal stenosis with chronic back pain, asthma.   Clinical Impression  Pt admitted with above diagnosis. At the time of PT eval pt with decreased cognition and emotionally labile. Donned glasses at patient's request upon entry. Noted only 1 hearing aid present (in R ear) and pt repeatedly states "I can't see, I can't hear, I want out of this room." Pt was able to tolerate transition to/from EOB with +2 total assist and knee immobilizer donned. Pt reporting increased pain but able to be redirected and pt sat EOB ~15 minutes. Anticipate pt will require post-acute rehab <3 hours/day to maximize functional recovery and facilitate return to prior living situation. Pt currently with functional limitations due to the deficits listed below (see PT Problem List). Pt will benefit from acute skilled PT to increase their independence and safety with mobility to allow discharge.           If plan is discharge home, recommend the following: Two people to help with walking and/or transfers;Two people to help with bathing/dressing/bathroom;Direct supervision/assist for medications management;Direct supervision/assist for financial management;Assist for transportation;Help with stairs or ramp for entrance;Supervision due to cognitive status   Can travel by private vehicle   No    Equipment Recommendations None recommended by PT (TBD by next venue of care)  Recommendations for Other Services       Functional Status Assessment Patient has had a recent decline in their functional status and demonstrates the ability to make significant improvements in function  in a reasonable and predictable amount of time.     Precautions / Restrictions Precautions Precautions: Back;Fall Precaution Comments: 2L [place] , Required Braces or Orthoses: Knee Immobilizer - Right;Splint/Cast Knee Immobilizer - Right: On at all times Splint/Cast: wrist cock up L wrist UE Splint/Cast - Date Prophylactic Dressing Applied (if applicable): 09/13/23 Restrictions Weight Bearing Restrictions Per Provider Order: Yes RLE Weight Bearing Per Provider Order: Non weight bearing (per MD progress note NWB in KI)      Mobility  Bed Mobility Overal bed mobility: Needs Assistance Bed Mobility: Supine to Sit, Sit to Supine     Supine to sit: +2 for physical assistance, Total assist, +2 for safety/equipment, HOB elevated Sit to supine: Total assist, +2 for physical assistance, +2 for safety/equipment, HOB elevated   General bed mobility comments: helicopter method with full support of R LE in KI to EOB. pt with posterior lean and neck extension during transfer. pt yelling "no... oh" pt once eob able to be distracted visually with tissues and warm wash cloth to wipe face. pt requires (A) to sustain eob sitting. pt with L lateral lean pushing away from R hip area but able to repositioned to off-weight LUE. Trashcan under foot to prop RLE up as pt could not tolerate feet on the floor. Pt returned to supine with same pad and helicopter method. pt total +2 total (A) with bed tilted to slide to [facility].    Transfers                   General transfer comment: unable to attempt this session    Ambulation/Gait  Stairs            Wheelchair Mobility     Tilt Bed    Modified Rankin (Stroke Patients Only)       Balance Overall balance assessment: Needs assistance Sitting-balance support: Single extremity supported, Feet supported Sitting balance-Leahy Scale: Poor   Postural control: Left lateral lean, Posterior lean                                    Pertinent Vitals/Pain Pain Assessment Pain Assessment: Faces Faces Pain Scale: Hurts worst Pain Location: generalized-- at times reports R LE and then others reports back. Pt states "oh i hurt" when asked to define Pain Descriptors / Indicators: Discomfort, Grimacing, Guarding Pain Intervention(s): Limited activity within patient's tolerance, Monitored during session, Repositioned, Premedicated before session    Home Living Family/patient expects to be discharged to:: Skilled nursing facility                   Additional Comments: pt living in apt prior to fall and will need elevated level of care upon d/c    Prior Function Prior Level of Function : Independent/Modified Independent             Mobility Comments: Mod I with cane. pt reports recent back surgery ADLs Comments: indep per patient     Extremity/Trunk Assessment   Upper Extremity Assessment Upper Extremity Assessment: Defer to OT evaluation RUE Deficits / Details: bruising noted on R UE but pt using R hand without complaint of pain LUE Deficits / Details: pt with splint wrist cock up L LUE Coordination: decreased fine motor    Lower Extremity Assessment Lower Extremity Assessment: RLE deficits/detail;LLE deficits/detail RLE Deficits / Details: BIlateral edema noted with KI in place on the R. pt very sensative to any tactile input. elevated at the end of session on pillows to help with edema management. floating heels. could benefit from prevalon    Cervical / Trunk Assessment Cervical / Trunk Assessment: Kyphotic (hx of back surgery per patient)  Communication   Communication Communication: Hearing impairment;Difficulty following commands/understanding Following commands: Follows one step commands with increased time Cueing Techniques: Verbal cues;Gestural cues;Tactile cues  Cognition Arousal: Alert Behavior During Therapy: Flat affect Overall Cognitive Status: Impaired/Different  from baseline Area of Impairment: Orientation, Memory, Awareness                 Orientation Level: Disoriented to, Place, Time, Situation   Memory: Decreased short-term memory     Awareness: Intellectual   General Comments: pt at times reports being in her apartment. pt asking RN at the end of session for a new room. pt liable when reading grandson notes stating "they just go married".A note that patient read states that her son would visit in the evening monday. Pt asking twice during session if her son was outside the room. pt poor awareness to injury        General Comments General comments (skin integrity, edema, etc.): KI in place, purewick in place and skin appears intact at this time. recommenation for air mattress requested to MD and RN    Exercises     Assessment/Plan    PT Assessment Patient needs continued PT services  PT Problem List Decreased strength;Decreased activity tolerance;Decreased balance;Decreased mobility;Decreased knowledge of use of DME;Decreased safety awareness;Decreased knowledge of precautions;Pain       PT Treatment Interventions DME instruction;Gait training;Functional mobility training;Therapeutic  exercise;Therapeutic activities;Balance training;Cognitive remediation;Patient/family education    PT Goals (Current goals can be found in the Care Plan section)  Acute Rehab PT Goals Patient Stated Goal: None stated PT Goal Formulation: Patient unable to participate in goal setting Time For Goal Achievement: 09/28/23 Potential to Achieve Goals: Good    Frequency Min 1X/week     Co-evaluation PT/OT/SLP Co-Evaluation/Treatment: Yes Reason for Co-Treatment: Necessary to address cognition/behavior during functional activity;For patient/therapist safety;To address functional/ADL transfers PT goals addressed during session: Mobility/safety with mobility;Balance OT goals addressed during session: ADL's and self-care;Strengthening/ROM        AM-PAC PT "6 Clicks" Mobility  Outcome Measure Help needed turning from your back to your side while in a flat bed without using bedrails?: Total Help needed moving from lying on your back to sitting on the side of a flat bed without using bedrails?: Total Help needed moving to and from a bed to a chair (including a wheelchair)?: Total Help needed standing up from a chair using your arms (e.g., wheelchair or bedside chair)?: Total Help needed to walk in hospital room?: Total Help needed climbing 3-5 steps with a railing? : Total 6 Click Score: 6    End of Session Equipment Utilized During Treatment: Oxygen Activity Tolerance: Patient limited by pain;Other (comment) (Cognition) Patient left: in bed;with call bell/phone within reach;with bed alarm set Nurse Communication: Mobility status PT Visit Diagnosis: Pain;History of falling (Z91.81) Pain - Right/Left: Right Pain - part of body: Hip;Leg;Knee    Time: 0930-1002 PT Time Calculation (min) (ACUTE ONLY): 32 min   Charges:   PT Evaluation $PT Eval Moderate Complexity: 1 Mod   PT General Charges $$ ACUTE PT VISIT: 1 Visit         Conni Slipper, PT, DPT Acute Rehabilitation Services Secure Chat Preferred Office: [phone]   Marylynn Pearson 09/14/2023, 1:13 PM

## 2023-09-14 NOTE — NC FL2 (Signed)
[name] MEDICAID FL2 LEVEL OF CARE FORM     IDENTIFICATION  Patient Name: Rhonda Watkins Birthdate: 12/08/1931 Sex: female Admission Date (Current Location): 09/12/2023  [facility] and [place] Number:  Producer, television/film/video and Address:  [facility] [place], [address], [place], [place] [address]      Provider Number: [id number]  Attending Physician Name and Address:  Narda Bonds, MD  Relative Name and Phone Number:       Current Level of Care: Hospital Recommended Level of Care: Skilled Nursing Facility Prior Approval Number:    Date Approved/Denied:   PASRR Number: [id number]  Discharge Plan: SNF    Current Diagnoses: Patient Active Problem List   Diagnosis Date Noted   Femur fracture, right (HCC) 09/12/2023   SBO (small bowel obstruction) (HCC) 11/03/2022   Age-related osteoporosis without current pathological fracture 12/24/2020   Mild persistent asthma, uncomplicated 12/24/2020   Type 2 diabetes mellitus with other specified complication (HCC) 12/24/2020   Body mass index (BMI) 25.0-25.9, adult 03/09/2020   Lumbago of lumbar region with sciatica 07/14/2019   Bilateral hand pain 06/26/2017   Ganglion cyst of flexor tendon sheath of finger of right hand 06/26/2017   Primary osteoarthritis of both hands 06/26/2017   Trigger ring finger of right hand 06/26/2017   Ingrown right big toenail 05/08/2016   Displacement of cervical intervertebral disc without myelopathy 03/01/2015   Bursitis 01/30/2015   HNP (herniated nucleus pulposus), lumbar 01/12/2015   Displacement of lumbar intervertebral disc without myelopathy 02/16/2014   Essential hypertension 09/17/2013   Mitral regurgitation 09/17/2013   GERD (gastroesophageal reflux disease) 09/17/2013   Spinal stenosis 09/17/2013   Hypercholesterolemia 09/17/2013    Orientation RESPIRATION BLADDER Height & Weight     Self, Place  O2 ([place] 2L) Incontinent Weight: 123 lb 14.4 oz (56.2 kg) Height:   5\' 2"  (157.5 cm)  BEHAVIORAL SYMPTOMS/MOOD NEUROLOGICAL BOWEL NUTRITION STATUS      Continent Diet (See DC summary)  AMBULATORY STATUS COMMUNICATION OF NEEDS Skin   Extensive Assist Verbally Surgical wounds (R leg inc)                       Personal Care Assistance Level of Assistance  Bathing, Feeding, Dressing Bathing Assistance: Maximum assistance Feeding assistance: Limited assistance Dressing Assistance: Maximum assistance     Functional Limitations Info  Sight, Hearing, Speech Sight Info: Adequate Hearing Info: Impaired Speech Info: Adequate    SPECIAL CARE FACTORS FREQUENCY  PT (By licensed PT), OT (By licensed OT)     PT Frequency: 5x week OT Frequency: 5x week            Contractures Contractures Info: Not present    Additional Factors Info  Code Status, Allergies Code Status Info: Full Allergies Info: Jardiance (Empagliflozin)  Linagliptin  Metformin And Related           Current Medications (09/14/2023):  This is the current hospital active medication list Current Facility-Administered Medications  Medication Dose Route Frequency Provider Last Rate Last Admin   acetaminophen (TYLENOL) tablet 325-650 mg  325-650 mg Oral Q6H PRN Gawne, Meghan M, PA-C       acetaminophen (TYLENOL) tablet 500 mg  500 mg Oral Q6H Gawne, Meghan M, PA-C   500 mg at 09/14/23 0526   albuterol (PROVENTIL) (2.5 MG/3ML) 0.083% nebulizer solution 3 mL  3 mL Inhalation Q6H PRN Levester Fresh M, PA-C       alum & mag  hydroxide-simeth (MAALOX/MYLANTA) 200-200-20 MG/5ML suspension 30 mL  30 mL Oral Q4H PRN Gawne, Meghan M, PA-C       atorvastatin (LIPITOR) tablet 10 mg  10 mg Oral Daily Levester Fresh M, PA-C   10 mg at 09/14/23 8295   bisacodyl (DULCOLAX) suppository 10 mg  10 mg Rectal Daily PRN Levester Fresh M, PA-C       budesonide (PULMICORT) nebulizer solution 0.5 mg  0.5 mg Nebulization BID Gawne, Meghan M, PA-C   0.5 mg at 09/13/23 2014   camphor-menthol [facility]) lotion   Topical  PRN Narda Bonds, MD       docusate sodium (COLACE) capsule 100 mg  100 mg Oral BID Levester Fresh M, PA-C   100 mg at 09/14/23 6213   enoxaparin (LOVENOX) injection 40 mg  40 mg Subcutaneous Q24H Levester Fresh M, PA-C   40 mg at 09/14/23 0865   furosemide (LASIX) tablet 40 mg  40 mg Oral Daily Levester Fresh M, PA-C   40 mg at 09/14/23 7846   gabapentin (NEURONTIN) capsule 100 mg  100 mg Oral TID Narda Bonds, MD       HYDROcodone-acetaminophen (NORCO) 7.5-325 MG per tablet 1-2 tablet  1-2 tablet Oral Q4H PRN Jenne Pane, PA-C   2 tablet at 09/14/23 1414   HYDROcodone-acetaminophen (NORCO/VICODIN) 5-325 MG per tablet 1-2 tablet  1-2 tablet Oral Q4H PRN Gawne, Meghan M, PA-C       insulin aspart (novoLOG) injection 0-9 Units  0-9 Units Subcutaneous TID WC Levester Fresh M, PA-C   2 Units at 09/14/23 1210   menthol-cetylpyridinium (CEPACOL) lozenge 3 mg  1 lozenge Oral PRN Levester Fresh M, PA-C       Or   phenol (CHLORASEPTIC) mouth spray 1 spray  1 spray Mouth/Throat PRN Gawne, Lindie Spruce M, PA-C       methocarbamol (ROBAXIN) tablet 500 mg  500 mg Oral Q6H PRN Levester Fresh M, PA-C   500 mg at 09/14/23 1414   Or   methocarbamol (ROBAXIN) injection 500 mg  500 mg Intravenous Q6H PRN Jenne Pane, PA-C   500 mg at 09/14/23 9629   metoCLOPramide (REGLAN) tablet 5-10 mg  5-10 mg Oral Q8H PRN Levester Fresh M, PA-C       Or   metoCLOPramide (REGLAN) injection 5-10 mg  5-10 mg Intravenous Q8H PRN Gawne, Meghan M, PA-C       morphine (PF) 2 MG/ML injection 0.5-1 mg  0.5-1 mg Intravenous Q2H PRN Levester Fresh M, PA-C   1 mg at 09/14/23 0826   ondansetron (ZOFRAN) tablet 4 mg  4 mg Oral Q6H PRN Levester Fresh M, PA-C       Or   ondansetron [facility]) injection 4 mg  4 mg Intravenous Q6H PRN Gawne, Meghan M, PA-C       pantoprazole (PROTONIX) EC tablet 40 mg  40 mg Oral Daily Levester Fresh M, PA-C   40 mg at 09/14/23 5284   polyethylene glycol (MIRALAX / GLYCOLAX) packet 17 g  17 g Oral Daily PRN  Jenne Pane, PA-C         Discharge Medications: Please see discharge summary for a list of discharge medications.  Relevant Imaging Results:  Relevant Lab Results:   Additional Information SSN: [id number]  Carley Hammed, LCSW

## 2023-09-14 NOTE — TOC CAGE-AID Note (Signed)
Transition of Care [facility]) - CAGE-AID Screening   Patient Details  Name: Rhonda Watkins MRN: [id number] Date of Birth: 03-Sep-1931   Judie Bonus, RN Phone Number: 09/14/2023, 6:30 AM   Clinical Narrative: Very remote tobacco user, denies elicit substances, occasional light etoh, no resources needed    CAGE-AID Screening:    Have You Ever Felt You Ought to Cut Down on Your Drinking or Drug Use?: No Have People Annoyed You By [employer] Your Drinking Or Drug Use?: No Have You Felt Bad Or Guilty About Your Drinking Or Drug Use?: No Have You Ever Had a Drink or Used Drugs First Thing In The Morning to Steady Your Nerves or to Get Rid of a Hangover?: No CAGE-AID Score: 0  Substance Abuse Education Offered: No

## 2023-09-14 NOTE — Progress Notes (Addendum)
MRI reviewed. She has an adjacent level herniated disc at L2-3 on the right with spinal stenosis. At this point there is nothing for [place] to do given her femur fracture. Recommend outpatient follow up with [place] once she is discharged from hospital. Can mange with steroids and pain medicine if this is even an issue for her.

## 2023-09-14 NOTE — Progress Notes (Signed)
Subjective: Patient reports pain as moderate to severe. Very HOH. Seems extra emotional today and frequently crying. Tolerating diet.  Urinating.   No CP, SOB.  Has not mobilized OOB with PT yet.  Objective:   VITALS:   Vitals:   09/13/23 2014 09/14/23 0048 09/14/23 0600 09/14/23 0811  BP:  (!) (P) 114/48 (!) 132/44 (!) 143/45  Pulse:  (P) 70 81 82  Resp:  (P) 16 16 16   Temp:  (P) 97.7 F (36.5 C) 98.1 F (36.7 C) 97.8 F (36.6 C)  TempSrc:  (P) Axillary Oral   SpO2: 98% (P) 100% 100% 100%  Weight:      Height:          Latest Ref Rng & Units 09/14/2023    5:21 AM 09/13/2023    4:16 PM 09/13/2023    8:05 AM  CBC  WBC 4.0 - 10.5 K/uL 13.6  14.8  13.1   Hemoglobin 12.0 - 15.0 g/dL 82.9  56.2  13.0   Hematocrit 36.0 - 46.0 % 31.1  35.0  35.1   Platelets 150 - 400 K/uL 179  171  248       Latest Ref Rng & Units 09/13/2023    4:16 PM 09/13/2023    8:05 AM 09/12/2023   11:14 AM  BMP  Glucose 70 - 99 mg/dL  865  784   BUN 8 - 23 mg/dL  10  15   Creatinine 6.96 - 1.00 mg/dL 2.95  2.84  1.32   Sodium 135 - 145 mmol/L  138  140   Potassium 3.5 - 5.1 mmol/L  3.8  3.5   Chloride 98 - 111 mmol/L  108  107   CO2 22 - 32 mmol/L  18  18   Calcium 8.9 - 10.3 mg/dL  9.0  9.5    Intake/Output      02/02 0701 02/03 0700 02/03 0701 02/04 0700   P.O.     I.V. (mL/kg) 1096.9 (19.5)    IV Piggyback 1206    Total Intake(mL/kg) 2302.9 (41)    Blood 150    Total Output 150    Net +2152.9         Urine Occurrence 1 x       Physical Exam: General: NAD.  Sitting up in bed, eating lunch, somewhat disoriented and histrionic, frequently crying Resp: No increased wob Cardio: regular rate and rhythm ABD soft Neurologically intact MSK Neurovascularly intact Sensation intact distally Intact pulses distally Dorsiflexion/Plantar flexion intact Incision: dressing C/D/I KI in place  Assessment: 1 Day Post-Op  S/P Procedure(s) (LRB): OPEN REDUCTION INTERNAL FIXATION (ORIF) DISTAL FEMUR  FRACTURE (Right) by Dr. Jewel Baize. Eulah Pont on 09/13/23  Principal Problem:   Femur fracture, right (HCC) Active Problems:   Essential hypertension   GERD (gastroesophageal reflux disease)   Spinal stenosis   Hypercholesterolemia   Lumbago of lumbar region with sciatica   Mild persistent asthma, uncomplicated   Type 2 diabetes mellitus with other specified complication (HCC)   Plan: Better control of her psychiatric state, medicines likely contributing to this, need to try to keep calm and not get so worked up  [employer] Up with therapy Incentive Spirometry Elevate and Apply ice  Weightbearing: PWB 25%  RLE Insicional and dressing care: Dressings left intact until follow-up and Reinforce dressings as needed Orthopedic device(s):  KI Showering: Keep dressing dry VTE prophylaxis: Lovenox 40mg  qd , SCDs, ambulation Pain control: PRN, limit narcotics as able due to  age Follow - up plan: 2 weeks Contact information:  Margarita Rana MD, Levester Fresh PA-C  Dispo:  TBD based on PT evaluations.    Jenne Pane, PA-C Office [phone] 09/14/2023, 8:59 AM

## 2023-09-14 NOTE — Evaluation (Signed)
Occupational Therapy Evaluation Patient Details Name: Rhonda Watkins MRN: [id number] DOB: 06/19/1932 Today's Date: 09/14/2023   History of Present Illness 89 yo female fall s/p 2/1 R ORIF femur L wrist fx  PMH HTN HLD GERD spinal stenosis with chronic back pain asthma   Clinical Impression   Patient is s/p R ORIF femur surgery resulting in functional limitations due to the deficits listed below (see OT problem list). Pt living in apt mod I with all adls prior to admission per patient. No family present to confirm and cognitive deficits noted during session. Pt HOH with only R hearing aide noted at this session. Pt communicated several times "I can't hear very well" and therapist redirected communication with pt repeating it to help ensure understanding. Pt limited to bed level care due to pain. Recommendation for SLP due to coughing with water, air mattress overlay and prevalon for skin protection.  Patient will benefit from skilled OT acutely to increase independence and safety with ADLS to allow discharge skilled inpatient follow up therapy, <3 hours/day. . Call me Suzy        If plan is discharge home, recommend the following: Two people to help with walking and/or transfers;Two people to help with bathing/dressing/bathroom    Functional Status Assessment  Patient has had a recent decline in their functional status and demonstrates the ability to make significant improvements in function in a reasonable and predictable amount of time.  Equipment Recommendations  Wheelchair (measurements OT);Wheelchair cushion (measurements OT);Hospital bed;Hoyer lift    Recommendations for Other Services Speech consult (coughing on water during session)     Precautions / Restrictions Precautions Precautions: Back;Fall Precaution Comments: 2L Crooks , Required Braces or Orthoses: Knee Immobilizer - Right;Splint/Cast Knee Immobilizer - Right: On at all times Splint/Cast: wrist cock up L wrist  UE Splint/Cast - Date Prophylactic Dressing Applied (if applicable): 09/13/23 Restrictions Weight Bearing Restrictions Per Provider Order: Yes RLE Weight Bearing Per Provider Order: Non weight bearing (per MD progress note NWB in KI)      Mobility Bed Mobility Overal bed mobility: Needs Assistance Bed Mobility: Supine to Sit, Sit to Supine     Supine to sit: +2 for physical assistance, Total assist, +2 for safety/equipment, HOB elevated Sit to supine: Total assist, +2 for physical assistance, +2 for safety/equipment, HOB elevated   General bed mobility comments: helicopter method with full support of R LE in KI to EOB. pt with posterior lean and neck extension during transfer. pt yelling "no... oh" pt once eob able to be distracted visually with tissues and warm wash cloth to wipe face. pt requires (A) to sustain eob sitting. pt with L lateral lean pushing away from R hip area. pt returned to supine with same pad and helicopter method. pt total +2 total (A) with bed tilted to slide to [facility].    Transfers                   General transfer comment: unable to attempt this session      Balance Overall balance assessment: Needs assistance Sitting-balance support: Single extremity supported, Feet supported Sitting balance-Leahy Scale: Poor   Postural control: Left lateral lean, Posterior lean                                 ADL either performed or assessed with clinical judgement   ADL Overall ADL's : Needs assistance/impaired Eating/Feeding: Minimal assistance Eating/Feeding Details (  indicate cue type and reason): sipping on water and coughing noted in upright posture. Requesting SLP consult to ensure decreased aspiration risk Grooming: Wash/dry face;Set up Grooming Details (indicate cue type and reason): pt with box of tissues in L hand and asking staff to give her tissue. pt lacks awareness to box presence. Upper Body Bathing: Maximal assistance   Lower Body  Bathing: Total assistance;+2 for physical assistance;+2 for safety/equipment   Upper Body Dressing : Maximal assistance   Lower Body Dressing: Total assistance;+2 for physical assistance;+2 for safety/equipment                 General ADL Comments: pt limited by pain and cognitive deficits. pt dangled EOB and returned to supine. pt distracted with personal items on eob and increased core activation . pt needed max cues and (A) to prevent weight bearing on LUE     Vision Baseline Vision/History: 1 Wears glasses       Perception         Praxis         Pertinent Vitals/Pain Pain Assessment Pain Assessment: Faces Faces Pain Scale: Hurts worst Pain Location: generalized-- at times reports R LE and then others reports back. Pt states "oh i hurt" when asked to define Pain Descriptors / Indicators: Discomfort, Grimacing, Guarding Pain Intervention(s): Premedicated before session, Repositioned, Limited activity within patient's tolerance (morphine, tylenol and robaxin given prior to session)     Extremity/Trunk Assessment Upper Extremity Assessment Upper Extremity Assessment: Right hand dominant;LUE deficits/detail;RUE deficits/detail RUE Deficits / Details: bruising noted on R UE but pt using R hand without complaint of pain LUE Deficits / Details: pt with splint wrist cock up L LUE Coordination: decreased fine motor   Lower Extremity Assessment Lower Extremity Assessment: Defer to PT evaluation;RLE deficits/detail RLE Deficits / Details: edema noted with KI in place. pt very sensative to any tactile input. elevated at the end of session on pillows to help with edema management. floating heels. could benefit from prevalon   Cervical / Trunk Assessment Cervical / Trunk Assessment: Kyphotic (hx of back pain per patient)   Communication Communication Communication: Hearing impairment;Difficulty following commands/understanding Following commands: Follows one step commands  with increased time Cueing Techniques: Gestural cues;Tactile cues;Visual cues   Cognition Arousal: Alert Behavior During Therapy: Flat affect Overall Cognitive Status: Impaired/Different from baseline Area of Impairment: Orientation, Memory, Awareness                 Orientation Level: Disoriented to, Place, Time, Situation   Memory: Decreased short-term memory     Awareness: Intellectual   General Comments: pt at times reports being in her apartment. pt asking RN at the end of session for a new room. pt liable when reading grandson notes stating "they just go married".A note that patient read states that her son would visit in the evening monday. Pt asking twice during session if her son was outside the room. pt poor awareness to injury     General Comments  KI in place, purewick in place and skin appears intact at this time. recommenation for air mattress requested to MD and RN    Exercises     Shoulder Instructions      Home Living Family/patient expects to be discharged to:: Skilled nursing facility                                 Additional Comments: pt living in  apt prior to fall and will need elevated level fo care upon d/c      Prior Functioning/Environment Prior Level of Function : Independent/Modified Independent             Mobility Comments: Mod I with cane. pt reports recent back surgery ADLs Comments: indep per patient        OT Problem List: Decreased strength;Impaired balance (sitting and/or standing);Decreased cognition;Decreased safety awareness;Decreased knowledge of use of DME or AE;Decreased knowledge of precautions;Cardiopulmonary status limiting activity;Obesity;Pain      OT Treatment/Interventions: Self-care/ADL training;Therapeutic exercise;Energy conservation;DME and/or AE instruction;Manual therapy;Modalities;Splinting;Therapeutic activities;Cognitive remediation/compensation;Patient/family education;Balance training     OT Goals(Current goals can be found in the care plan section) Acute Rehab OT Goals Patient Stated Goal: to lay down OT Goal Formulation: Patient unable to participate in goal setting Time For Goal Achievement: 09/28/23 Potential to Achieve Goals: Good  OT Frequency: Min 1X/week    Co-evaluation PT/OT/SLP Co-Evaluation/Treatment: Yes Reason for Co-Treatment: Necessary to address cognition/behavior during functional activity;For patient/therapist safety;To address functional/ADL transfers   OT goals addressed during session: ADL's and self-care;Strengthening/ROM      AM-PAC OT "6 Clicks" Daily Activity     Outcome Measure Help from another person eating meals?: A Lot Help from another person taking care of personal grooming?: A Lot Help from another person toileting, which includes using toliet, bedpan, or urinal?: Total Help from another person bathing (including washing, rinsing, drying)?: Total Help from another person to put on and taking off regular upper body clothing?: A Lot Help from another person to put on and taking off regular lower body clothing?: Total 6 Click Score: 9   End of Session Equipment Utilized During Treatment: Right knee immobilizer;Oxygen Nurse Communication: Mobility status;Precautions;Need for lift equipment  Activity Tolerance: Patient limited by pain Patient left: in bed;with call bell/phone within reach;with bed alarm set;with nursing/sitter in room  OT Visit Diagnosis: Unsteadiness on feet (R26.81);Muscle weakness (generalized) (M62.81);Pain Pain - Right/Left: Right Pain - part of body: Leg                Time: 1191-4782 OT Time Calculation (min): 28 min Charges:  OT General Charges $OT Visit: 1 Visit OT Evaluation $OT Eval Moderate Complexity: 1 Mod   Brynn, OTR/L  Acute Rehabilitation Services Office: [phone] .   Mateo Flow 09/14/2023, 11:52 AM

## 2023-09-14 NOTE — Progress Notes (Signed)
PROGRESS NOTE    Rhonda Watkins  WGN:[id number] DOB: [birth year] DOA: 09/12/2023 PCP: Emilio Aspen, MD   Brief Narrative: Rhonda Watkins is a 89 y.o. female with a history of hypertension, hyperlipidemia, GERD, spinal stenosis with chronic back pain, asthma.  Patient presented secondary to a fall and found to have a right femur fracture. Orthopedic surgery consulted for repair.   Assessment and Plan:  Right femur fracture Secondary to fall which may have been related to chronic back pain. CT evidence significant for an acute comminuted spiral-oriented fracture of the distal femoral diaphysis. Orthopedic surgery consulted and performed an ORIF on 2/3. -PT/OT recommendations -Ongoing orthopedic surgery recommendations -Continue analgesics  Left wrist fracture Possible based on x-ray imaging; possibly an old injury. Splint placed. -Orthopedic surgery recommendations for management  Fall Reported as a mechanical fall. No mention of syncope.  Primary hypertension Patient is not on antihypertensive therapy as an outpatient.  Hyperlipidemia -Continue Lipitor  GERD -Continue Protonix  Diabetes mellitus type 2 Poorly controlled with hyperglycemia. Hemoglobin A1C of 8.3%. Patient is on Jardiance as an outpatient.  Spinal stenosis Herniated disc at L2-3 Chronic back pain Noted. Patient follows with Neurosurgery, Dr. Wynetta Emery. Neurosurgery consulted this admission by orthopedic surgery and have recommended outpatient follow-up. -Continue analgesics  Asthma -Continue albuterol  Right knee osteoarthritis Severe disease with small-moderate effusion noted on imaging.   DVT prophylaxis: Per orthopedic surgery; Lovenox Code Status:   Code Status: Full Code Family Communication: None at bedside. Son on telephone. Disposition Plan: Discharge pending orthopedic surgery recommendations and eventual PT/OT recommendations   Consultants:  Orthopedic  surgery Neurosurgery  Procedures:  ORIF distal femur fracture  Antimicrobials: None    Subjective: Continues back pain. No other specific concerns.  Objective: BP (!) 143/45 (BP Location: Right Arm)   Pulse 82   Temp 97.8 F (36.6 C)   Resp 16   Ht 5\' 2"  (1.575 m)   Wt 56.2 kg   SpO2 100%   BMI 22.66 kg/m   Examination:  General exam: Appears calm and comfortable Respiratory system: Clear to auscultation. Respiratory effort normal. Cardiovascular system: S1 & S2 heard, RRR. No murmurs. Gastrointestinal system: Abdomen is nondistended, soft and nontender. Normal bowel sounds heard. Central nervous system: Alert. Musculoskeletal: 2+ BLE pitting edema. No calf tenderness   Data Reviewed: I have personally reviewed following labs and imaging studies  CBC Lab Results  Component Value Date   WBC 13.6 (H) 09/14/2023   RBC 3.37 (L) 09/14/2023   HGB 10.1 (L) 09/14/2023   HCT 31.1 (L) 09/14/2023   MCV 92.3 09/14/2023   MCH 30.0 09/14/2023   PLT 179 09/14/2023   MCHC 32.5 09/14/2023   RDW 13.6 09/14/2023   LYMPHSABS 1.4 09/12/2023   MONOABS 1.1 (H) 09/12/2023   EOSABS 0.0 09/12/2023   BASOSABS 0.0 09/12/2023     Last metabolic panel Lab Results  Component Value Date   NA 138 09/13/2023   K 3.8 09/13/2023   CL 108 09/13/2023   CO2 18 (L) 09/13/2023   BUN 10 09/13/2023   CREATININE 0.68 09/13/2023   GLUCOSE 180 (H) 09/13/2023   GFRNONAA >60 09/13/2023   GFRAA >60 05/15/2020   CALCIUM 9.0 09/13/2023   PHOS 3.6 11/11/2022   PROT 4.9 (L) 11/10/2022   ALBUMIN 2.2 (L) 11/10/2022   BILITOT 0.5 11/10/2022   ALKPHOS 57 11/10/2022   AST 14 (L) 11/10/2022   ALT 12 11/10/2022   ANIONGAP 12 09/13/2023    GFR: Estimated  Creatinine Clearance: 37.7 mL/min (by C-G formula based on SCr of 0.68 mg/dL).  Recent Results (from the past 240 hours)  Surgical PCR screen     Status: None   Collection Time: 09/12/23 10:29 PM   Specimen: Nasal Mucosa; Nasal Swab  Result  Value Ref Range Status   MRSA, PCR NEGATIVE NEGATIVE Final   Staphylococcus aureus NEGATIVE NEGATIVE Final    Comment: (NOTE) The Xpert SA Assay (FDA approved for NASAL specimens in patients 33 years of age and older), is one component of a comprehensive surveillance program. It is not intended to diagnose infection nor to guide or monitor treatment. Performed at [facility], [address], [place], [place] [address]       Radiology Studies: DG FEMUR, [place] 2 VIEWS RIGHT Result Date: 09/13/2023 CLINICAL DATA:  Open reduction internal fixation distal femur fracture. EXAM: RIGHT FEMUR 2 VIEWS COMPARISON:  09/12/2023. FINDINGS: Five intraoperative fluoroscopic spot views of the distal right femur show lateral plate and screw fixation of a spiral fracture of the distal femoral shaft. Fracture fragments are in better anatomic alignment. Overall osseous detail degraded by technique. IMPRESSION: ORIF distal femur fracture. Electronically Signed   By: Leanna Battles M.D.   On: 09/13/2023 14:50   DG C-Arm 1-60 Min-No Report Result Date: 09/13/2023 Fluoroscopy was utilized by the requesting physician.  No radiographic interpretation.   DG C-Arm 1-60 Min-No Report Result Date: 09/13/2023 Fluoroscopy was utilized by the requesting physician.  No radiographic interpretation.   CT FEMUR RIGHT WO CONTRAST Result Date: 09/12/2023 CLINICAL DATA:  Upper leg trauma EXAM: CT OF THE LOWER RIGHT EXTREMITY WITHOUT CONTRAST TECHNIQUE: Multidetector CT imaging of the right lower extremity was performed according to the standard protocol. RADIATION DOSE REDUCTION: This exam was performed according to the departmental dose-optimization program which includes automated exposure control, adjustment of the mA and/or kV according to patient size and/or use of iterative reconstruction technique. COMPARISON:  X-ray 09/12/2023 FINDINGS: Bones/Joint/Cartilage Acute comminuted, spiral oriented fracture of the distal femoral  diaphysis. Mild-moderate anteromedial displacement and mild posterior apex angulation. 12.7 cm butterfly fragment with slight anteromedial displacement. Fracture does not extend intra-articularly to involve the knee joint. No lytic or sclerotic changes to the bone to suggest a pathologic fracture. The bones are demineralized. Hip joint intact with mild to moderate osteoarthritis. No hip joint effusion. Severe tricompartmental osteoarthritis of the right knee with bone-on-bone apposition in the medial compartment. Small-moderate size knee joint effusion. Ligaments Suboptimally assessed by CT. Muscles and Tendons Ill-defined hematoma within the anterior compartment musculature of the mid to distal right thigh. Soft tissues Soft tissue swelling at the fracture site. No right inguinal lymphadenopathy. IMPRESSION: 1. Acute comminuted, spiral-oriented fracture of the distal femoral diaphysis, as described. 2. Ill-defined hematoma within the anterior compartment musculature of the mid to distal right thigh. 3. Severe tricompartmental osteoarthritis of the right knee. Small-moderate size knee joint effusion. Electronically Signed   By: Duanne Guess D.O.   On: 09/12/2023 13:44   DG Wrist Complete Left Result Date: 09/12/2023 CLINICAL DATA:  Fall, left wrist pain EXAM: LEFT WRIST - COMPLETE 3+ VIEW COMPARISON:  None Available. FINDINGS: Bony ridging along the lateral and dorsal distal radial metaphysis, possibly spurring or deformity from an old injury, difficult to exclude a buckle type fracture. Mild soft tissue swelling overlying the distal ulna, questionable nondisplaced transverse fracture of the ulnar styloid on the oblique projection. Advanced osteoarthritis at the first carpometacarpal articulation. Faint calcifications noted along the intrinsic interosseous  ligaments but not along the TFCC disc. IMPRESSION: 1. Bony ridging along the lateral and dorsal distal radial metaphysis, possibly spurring or deformity  from an old injury, difficult to exclude a buckle type fracture. 2. Questionable nondisplaced transverse fracture of the ulnar styloid. 3. Advanced osteoarthritis at the first carpometacarpal articulation. Electronically Signed   By: Gaylyn Rong M.D.   On: 09/12/2023 12:34   DG Knee Complete 4 Views Right Result Date: 09/12/2023 CLINICAL DATA:  Fall, knee pain EXAM: RIGHT KNEE - COMPLETE 4+ VIEW COMPARISON:  None Available. FINDINGS: Spiral fracture of the distal fibular metadiaphysis. Mild distal comminution. 2.0 cm posteromedial displacement of the distal fragment. Prominent osteoarthritis in the knee.  Meniscal chondrocalcinosis. IMPRESSION: 1. Spiral fracture of the distal fibular metadiaphysis with mild distal comminution. 2. Prominent osteoarthritis in the knee. 3. Meniscal chondrocalcinosis. Electronically Signed   By: Gaylyn Rong M.D.   On: 09/12/2023 12:32   DG Pelvis 1-2 Views Result Date: 09/12/2023 CLINICAL DATA:  Fall, right hip pain EXAM: PELVIS - 1-2 VIEW COMPARISON:  CT pelvis 11/03/2022 FINDINGS: Bony demineralization. Postoperative findings in the lower lumbar spine. Both hips are somewhat externally rotated. No definite fracture identified. IMPRESSION: 1. No definite fracture identified. 2. Bony demineralization. 3. Postoperative findings in the lower lumbar spine. Electronically Signed   By: Gaylyn Rong M.D.   On: 09/12/2023 12:30      LOS: 2 days    Jacquelin Hawking, MD [facility] 09/14/2023, 10:13 AM   If 7PM-7AM, please contact night-coverage [email or web address]

## 2023-09-14 NOTE — Plan of Care (Signed)
  Problem: Education: Goal: Ability to describe self-care measures that may prevent or decrease complications (Diabetes Survival Skills Education) will improve Outcome: Progressing Goal: Individualized Educational Video(s) Outcome: Progressing   Problem: Coping: Goal: Ability to adjust to condition or change in health will improve Outcome: Progressing   Problem: Fluid Volume: Goal: Ability to maintain a balanced intake and output will improve Outcome: Progressing   Problem: Health Behavior/Discharge Planning: Goal: Ability to identify and utilize available resources and services will improve Outcome: Progressing Goal: Ability to manage health-related needs will improve Outcome: Progressing   Problem: Metabolic: Goal: Ability to maintain appropriate glucose levels will improve Outcome: Progressing   Problem: Nutritional: Goal: Maintenance of adequate nutrition will improve Outcome: Progressing Goal: Progress toward achieving an optimal weight will improve Outcome: Progressing   Problem: Skin Integrity: Goal: Risk for impaired skin integrity will decrease Outcome: Progressing   Problem: Tissue Perfusion: Goal: Adequacy of tissue perfusion will improve Outcome: Progressing   Problem: Education: Goal: Knowledge of General Education information will improve Description: Including pain rating scale, medication(s)/side effects and non-pharmacologic comfort measures Outcome: Progressing   Problem: Health Behavior/Discharge Planning: Goal: Ability to manage health-related needs will improve Outcome: Progressing   Problem: Clinical Measurements: Goal: Ability to maintain clinical measurements within normal limits will improve Outcome: Progressing Goal: Will remain free from infection Outcome: Progressing Goal: Diagnostic test results will improve Outcome: Progressing Goal: Respiratory complications will improve Outcome: Progressing Goal: Cardiovascular complication will  be avoided Outcome: Progressing   Problem: Activity: Goal: Risk for activity intolerance will decrease Outcome: Progressing   Problem: Nutrition: Goal: Adequate nutrition will be maintained Outcome: Progressing   Problem: Coping: Goal: Level of anxiety will decrease Outcome: Progressing   Problem: Elimination: Goal: Will not experience complications related to bowel motility Outcome: Progressing Goal: Will not experience complications related to urinary retention Outcome: Progressing   Problem: Pain Managment: Goal: General experience of comfort will improve and/or be controlled Outcome: Progressing   Problem: Safety: Goal: Ability to remain free from injury will improve Outcome: Progressing   Problem: Skin Integrity: Goal: Risk for impaired skin integrity will decrease Outcome: Progressing   Problem: Education: Goal: Verbalization of understanding the information provided (i.e., activity precautions, restrictions, etc) will improve Outcome: Progressing Goal: Individualized Educational Video(s) Outcome: Progressing   Problem: Activity: Goal: Ability to ambulate and perform ADLs will improve Outcome: Progressing   Problem: Clinical Measurements: Goal: Postoperative complications will be avoided or minimized Outcome: Progressing   Problem: Self-Concept: Goal: Ability to maintain and perform role responsibilities to the fullest extent possible will improve Outcome: Progressing   Problem: Pain Management: Goal: Pain level will decrease Outcome: Progressing

## 2023-09-15 DIAGNOSIS — S72491A Other fracture of lower end of right femur, initial encounter for closed fracture: Secondary | ICD-10-CM | POA: Diagnosis not present

## 2023-09-15 LAB — GLUCOSE, CAPILLARY
Glucose-Capillary: 138 mg/dL — ABNORMAL HIGH (ref 70–99)
Glucose-Capillary: 134 mg/dL — ABNORMAL HIGH (ref 70–99)
Glucose-Capillary: 106 mg/dL — ABNORMAL HIGH (ref 70–99)
Glucose-Capillary: 171 mg/dL — ABNORMAL HIGH (ref 70–99)

## 2023-09-15 LAB — CBC
HCT: 28.7 % — ABNORMAL LOW (ref 36.0–46.0)
Hemoglobin: 9.1 g/dL — ABNORMAL LOW (ref 12.0–15.0)
MCH: 29.7 pg (ref 26.0–34.0)
MCHC: 31.7 g/dL (ref 30.0–36.0)
MCV: 93.8 fL (ref 80.0–100.0)
Platelets: 210 10*3/uL (ref 150–400)
RBC: 3.06 MIL/uL — ABNORMAL LOW (ref 3.87–5.11)
RDW: 13.7 % (ref 11.5–15.5)
WBC: 10.9 10*3/uL — ABNORMAL HIGH (ref 4.0–10.5)
nRBC: 0 % (ref 0.0–0.2)

## 2023-09-15 NOTE — Progress Notes (Signed)
    Subjective: Patient reports pain as moderate to severe. Very HOH. Tolerating diet.  Urinating.   No CP, SOB.  Has not mobilized OOB much with PT yet due to pain and anxiety.  Objective:   VITALS:   Vitals:   09/14/23 1940 09/14/23 2101 09/15/23 0542 09/15/23 0816  BP:  (!) 124/50 (!) 127/58 134/60  Pulse:  79 70 76  Resp:  18 16 17   Temp:  98 F (36.7 C) 98.6 F (37 C) 97.7 F (36.5 C)  TempSrc:  Oral  Oral  SpO2: 98% 100%    Weight:      Height:          Latest Ref Rng & Units 09/15/2023    8:27 AM 09/14/2023    5:21 AM 09/13/2023    4:16 PM  CBC  WBC 4.0 - 10.5 K/uL 10.9  13.6  14.8   Hemoglobin 12.0 - 15.0 g/dL 9.1  89.8  88.7   Hematocrit 36.0 - 46.0 % 28.7  31.1  35.0   Platelets 150 - 400 K/uL 210  179  171       Latest Ref Rng & Units 09/13/2023    4:16 PM 09/13/2023    8:05 AM 09/12/2023   11:14 AM  BMP  Glucose 70 - 99 mg/dL  819  830   BUN 8 - 23 mg/dL  10  15   Creatinine 9.55 - 1.00 mg/dL 9.31  9.31  9.30   Sodium 135 - 145 mmol/L  138  140   Potassium 3.5 - 5.1 mmol/L  3.8  3.5   Chloride 98 - 111 mmol/L  108  107   CO2 22 - 32 mmol/L  18  18   Calcium  8.9 - 10.3 mg/dL  9.0  9.5    Intake/Output      02/03 0701 02/04 0700 02/04 0701 02/05 0700   I.V. (mL/kg)     IV Piggyback     Total Intake(mL/kg)     Urine (mL/kg/hr) 1100 (0.8)    Blood     Total Output 1100    Net -1100            Physical Exam: General: NAD.  Sleeping comfortably Resp: No increased wob Cardio: regular rate and rhythm ABD soft Neurologically intact MSK Neurovascularly intact Sensation intact distally Intact pulses distally Dorsiflexion/Plantar flexion intact Incision: dressing C/D/I KI in place  Assessment: 2 Days Post-Op  S/P Procedure(s) (LRB): OPEN REDUCTION INTERNAL FIXATION (ORIF) DISTAL FEMUR FRACTURE (Right) by Dr. Evalene BIRCH. Beverley on 09/13/23  Principal Problem:   Femur fracture, right (HCC) Active Problems:   Essential hypertension   GERD  (gastroesophageal reflux disease)   Spinal stenosis   Hypercholesterolemia   Lumbago of lumbar region with sciatica   Mild persistent asthma, uncomplicated   Type 2 diabetes mellitus with other specified complication (HCC)   Plan: Advance diet Up with therapy Incentive Spirometry Elevate and Apply ice  Weightbearing: PWB 25%  RLE Insicional and dressing care: Dressings left intact until follow-up and Reinforce dressings as needed Orthopedic device(s):  KI Showering: Keep dressing dry VTE prophylaxis: Lovenox  40mg  qd , SCDs, ambulation Pain control: PRN, limit narcotics as able due to age Follow - up plan: 2 weeks Contact information:  Evalene Beverley MD, Gerard Large PA-C  Dispo:  PT recommends SNF    Gerard CHRISTELLA Large DEVONNA Office [phone] 09/15/2023, 10:44 AM

## 2023-09-15 NOTE — Progress Notes (Signed)
 Patient refused the air mattress transfer because she feels that she cannot sit straight in the air mattress, the son scott added that her mother's spine should be checked for any injury by the doctor before transferring her to the air mattress. Will communicate with the morning nurse

## 2023-09-15 NOTE — TOC Initial Note (Signed)
 Transition of Care [facility]) - Initial/Assessment Note    Patient Details  Name: Rhonda Watkins MRN: [id number] Date of Birth: Jun 07, 1932  Transition of Care [facility]) CM/SW Contact:    Rhonda Sharps, LCSW Phone Number: 09/15/2023, 12:57 PM  Clinical Narrative:                 CSW met with pt at bedside to discuss SNF recommendation. Pt confirms she is from [place] and agreeable to SNF. Pt hard of hearing and lethargic, requests CSW speak with her son Rhonda Watkins. CSW spoke with Rhonda Watkins who noted agreement to SNF and that pt had been previously to [facility]. They would not like to pursue [facility] again, instead request a facility between [place] and [facility]. Scott agreeable to a fax out for options. TOC will continue to follow for DC needs.   Expected Discharge Plan: Skilled Nursing Facility Barriers to Discharge: Insurance Authorization, SNF Pending bed offer, Continued Medical Work up   Patient Goals and CMS Choice Patient states their goals for this hospitalization and ongoing recovery are:: Pt too tired to participate in goal setting. CMS Medicare.gov Compare Post Acute Care list provided to:: Patient Represenative (must comment) Choice offered to / list presented to : Adult Children      Expected Discharge Plan and Services In-house Referral: Clinical Social Work   Post Acute Care Choice: Skilled Nursing Facility Living arrangements for the past 2 months: Independent Living Facility                                      Prior Living Arrangements/Services Living arrangements for the past 2 months: Independent Living Facility Lives with:: Facility Resident Patient language and need for interpreter reviewed:: Yes Do you feel safe going back to the place where you live?: Yes      Need for Family Participation in Patient Care: Yes (Comment) Care giver support system in place?: Yes (comment)   Criminal Activity/Legal Involvement Pertinent to Current Situation/Hospitalization: No -  Comment as needed  Activities of Daily Living   ADL Screening (condition at time of admission) Independently performs ADLs?: No Does the patient have a NEW difficulty with bathing/dressing/toileting/self-feeding that is expected to last >3 days?: No Does the patient have a NEW difficulty with getting in/out of bed, walking, or climbing stairs that is expected to last >3 days?: No Does the patient have a NEW difficulty with communication that is expected to last >3 days?: No Is the patient deaf or have difficulty hearing?: Yes (impaired - R ear hearing aid) Does the patient have difficulty seeing, even when wearing glasses/contacts?: Yes (waering spectacle) Does the patient have difficulty concentrating, remembering, or making decisions?: No  Permission Sought/Granted Permission sought to share information with : Family Supports Permission granted to share information with : Yes, Verbal Permission Granted  Share Information with NAME: Rhonda Watkins     Permission granted to share info w Relationship: Son     Emotional Assessment Appearance:: Appears stated age Attitude/Demeanor/Rapport: Lethargic Affect (typically observed): Appropriate Orientation: : Oriented to Self, Oriented to Place, Oriented to  Time, Oriented to Situation Alcohol / Substance Use: Not Applicable Psych Involvement: No (comment)  Admission diagnosis:  Femur fracture, right (HCC) [S72.91XA] Closed fracture of right femur, unspecified fracture morphology, unspecified portion of femur, initial encounter [facility]) [S72.91XA] Patient Active Problem List   Diagnosis Date Noted   Femur fracture, right (HCC) 09/12/2023  SBO (small bowel obstruction) (HCC) 11/03/2022   Age-related osteoporosis without current pathological fracture 12/24/2020   Mild persistent asthma, uncomplicated 12/24/2020   Type 2 diabetes mellitus with other specified complication (HCC) 12/24/2020   Body mass index (BMI) 25.0-25.9, adult 03/09/2020   Lumbago  of lumbar region with sciatica 07/14/2019   Bilateral hand pain 06/26/2017   Ganglion cyst of flexor tendon sheath of finger of right hand 06/26/2017   Primary osteoarthritis of both hands 06/26/2017   Trigger ring finger of right hand 06/26/2017   Ingrown right big toenail 05/08/2016   Displacement of cervical intervertebral disc without myelopathy 03/01/2015   Bursitis 01/30/2015   HNP (herniated nucleus pulposus), lumbar 01/12/2015   Displacement of lumbar intervertebral disc without myelopathy 02/16/2014   Essential hypertension 09/17/2013   Mitral regurgitation 09/17/2013   GERD (gastroesophageal reflux disease) 09/17/2013   Spinal stenosis 09/17/2013   Hypercholesterolemia 09/17/2013   PCP:  Rhonda Dorn LABOR, MD Pharmacy:   [facility] [place], [place] - [address] [place] [address] Phone: [phone] Fax: [phone]     Social Drivers of Health (SDOH) Social History: SDOH Screenings   Food Insecurity: Patient Unable To Answer (09/13/2023)  Housing: Patient Unable To Answer (09/13/2023)  Transportation Needs: Patient Unable To Answer (09/13/2023)  Utilities: Patient Unable To Answer (09/13/2023)  Depression (PHQ2-9): Low Risk  (03/19/2020)  Social Connections: Patient Unable To Answer (09/13/2023)  Tobacco Use: Medium Risk (09/13/2023)   SDOH Interventions:     Readmission Risk Interventions     No data to display

## 2023-09-15 NOTE — Progress Notes (Signed)
 PROGRESS NOTE    Rhonda Watkins  FMW:[id number] DOB: Sep 27, 1931 DOA: 09/12/2023 PCP: Charlott Dorn LABOR, MD   Brief Narrative: Rhonda Watkins is a 89 y.o. female with a history of hypertension, hyperlipidemia, GERD, spinal stenosis with chronic back pain, asthma.  Patient presented secondary to a fall and found to have a right femur fracture. Orthopedic surgery consulted for repair.   Assessment and Plan:  Right femur fracture Secondary to fall which may have been related to chronic back pain. CT evidence significant for an acute comminuted spiral-oriented fracture of the distal femoral diaphysis. Orthopedic surgery consulted and performed an ORIF on 2/3. -PT/OT recommendations -Ongoing orthopedic surgery recommendations: partial weight bearing (25%) -Continue analgesics  Left wrist fracture Possible based on x-ray imaging; possibly an old injury. Splint placed. -Orthopedic surgery recommendations for management  Acute blood loss anemia Likely secondary to acute fracture and perioperative bleeding. Hemoglobin of 13.0 on admission. Drift down to 9.1 today -CBC daily until hemoglobin stabilizes  Fall Reported as a mechanical fall. No mention of syncope.  Primary hypertension Patient is not on antihypertensive therapy as an outpatient.  Hyperlipidemia -Continue Lipitor  GERD -Continue Protonix   Lower extremity edema Appears to be chronic. Patient has been evaluated for acute DVT in the past. No evidence of heart failure.  Diabetes mellitus type 2 Poorly controlled with hyperglycemia. Hemoglobin A1C of 8.3%. Patient is on Jardiance as an outpatient. -Continue SSI  Spinal stenosis Herniated disc at L2-3 Chronic back pain Noted. Patient follows with Neurosurgery, Dr. Onetha. Neurosurgery consulted this admission by orthopedic surgery and have recommended outpatient follow-up. -Continue analgesics -Air mattress  Asthma -Continue albuterol   Right knee  osteoarthritis Severe disease with small-moderate effusion noted on imaging.   DVT prophylaxis: Per orthopedic surgery; Lovenox , SCDs, ambulation Code Status:   Code Status: Full Code Family Communication: None at bedside. Disposition Plan: Discharge to SNF pending orthopedic surgery recommendations and stable hemoglobin   Consultants:  Orthopedic surgery Neurosurgery  Procedures:  ORIF distal femur fracture  Antimicrobials: None    Subjective: Back pain. No other concerns.  Objective: BP 134/60 (BP Location: Right Arm)   Pulse 76   Temp 97.7 F (36.5 C) (Oral)   Resp 17   Ht 5' 2 (1.575 m)   Wt 56.2 kg   SpO2 100%   BMI 22.66 kg/m   Examination:  General exam: Appears calm and comfortable Respiratory system: Clear to auscultation. Respiratory effort normal. Cardiovascular system: S1 & S2 heard, RRR. No murmurs, rubs, gallops or clicks. Gastrointestinal system: Abdomen is nondistended, soft and nontender. Normal bowel sounds heard. Central nervous system: Alert and oriented. No focal neurological deficits. Musculoskeletal: BLE pitting edema. No calf tenderness Skin: No cyanosis. No rashes Psychiatry: Judgement and insight appear normal. Mood & affect appropriate.    Data Reviewed: I have personally reviewed following labs and imaging studies  CBC Lab Results  Component Value Date   WBC 10.9 (H) 09/15/2023   RBC 3.06 (L) 09/15/2023   HGB 9.1 (L) 09/15/2023   HCT 28.7 (L) 09/15/2023   MCV 93.8 09/15/2023   MCH 29.7 09/15/2023   PLT 210 09/15/2023   MCHC 31.7 09/15/2023   RDW 13.7 09/15/2023   LYMPHSABS 1.4 09/12/2023   MONOABS 1.1 (H) 09/12/2023   EOSABS 0.0 09/12/2023   BASOSABS 0.0 09/12/2023     Last metabolic panel Lab Results  Component Value Date   NA 138 09/13/2023   K 3.8 09/13/2023   CL 108 09/13/2023   CO2  18 (L) 09/13/2023   BUN 10 09/13/2023   CREATININE 0.68 09/13/2023   GLUCOSE 180 (H) 09/13/2023   GFRNONAA >60 09/13/2023    GFRAA >60 05/15/2020   CALCIUM  9.0 09/13/2023   PHOS 3.6 11/11/2022   PROT 4.9 (L) 11/10/2022   ALBUMIN  2.2 (L) 11/10/2022   BILITOT 0.5 11/10/2022   ALKPHOS 57 11/10/2022   AST 14 (L) 11/10/2022   ALT 12 11/10/2022   ANIONGAP 12 09/13/2023    GFR: Estimated Creatinine Clearance: 37.7 mL/min (by C-G formula based on SCr of 0.68 mg/dL).  Recent Results (from the past 240 hours)  Surgical PCR screen     Status: None   Collection Time: 09/12/23 10:29 PM   Specimen: Nasal Mucosa; Nasal Swab  Result Value Ref Range Status   MRSA, PCR NEGATIVE NEGATIVE Final   Staphylococcus aureus NEGATIVE NEGATIVE Final    Comment: (NOTE) The Xpert SA Assay (FDA approved for NASAL specimens in patients 17 years of age and older), is one component of a comprehensive surveillance program. It is not intended to diagnose infection nor to guide or monitor treatment. Performed at [facility], [address] [place], [place] [address]       Radiology Studies: DG FEMUR, ALAB[place] 2 VIEWS RIGHT Result Date: 09/13/2023 CLINICAL DATA:  Open reduction internal fixation distal femur fracture. EXAM: RIGHT FEMUR 2 VIEWS COMPARISON:  09/12/2023. FINDINGS: Five intraoperative fluoroscopic spot views of the distal right femur show lateral plate and screw fixation of a spiral fracture of the distal femoral shaft. Fracture fragments are in better anatomic alignment. Overall osseous detail degraded by technique. IMPRESSION: ORIF distal femur fracture. Electronically Signed   By: Newell Eke M.D.   On: 09/13/2023 14:50   DG C-Arm 1-60 Min-No Report Result Date: 09/13/2023 Fluoroscopy was utilized by the requesting physician.  No radiographic interpretation.   DG C-Arm 1-60 Min-No Report Result Date: 09/13/2023 Fluoroscopy was utilized by the requesting physician.  No radiographic interpretation.      LOS: 3 days    Elgin Lam, MD [facility] 09/15/2023, 11:49 AM   If 7PM-7AM, please contact  night-coverage [email or web address]

## 2023-09-15 NOTE — Plan of Care (Signed)
  Problem: Coping: Goal: Ability to adjust to condition or change in health will improve Outcome: Progressing   Problem: Fluid Volume: Goal: Ability to maintain a balanced intake and output will improve Outcome: Progressing   Problem: Metabolic: Goal: Ability to maintain appropriate glucose levels will improve Outcome: Progressing   Problem: Skin Integrity: Goal: Risk for impaired skin integrity will decrease Outcome: Progressing   Problem: Skin Integrity: Goal: Risk for impaired skin integrity will decrease Outcome: Progressing

## 2023-09-15 NOTE — Plan of Care (Signed)
  Problem: Education: Goal: Ability to describe self-care measures that may prevent or decrease complications (Diabetes Survival Skills Education) will improve Outcome: Progressing Goal: Individualized Educational Video(s) Outcome: Progressing   Problem: Coping: Goal: Ability to adjust to condition or change in health will improve Outcome: Progressing   Problem: Fluid Volume: Goal: Ability to maintain a balanced intake and output will improve Outcome: Progressing   Problem: Health Behavior/Discharge Planning: Goal: Ability to identify and utilize available resources and services will improve Outcome: Progressing Goal: Ability to manage health-related needs will improve Outcome: Progressing   Problem: Metabolic: Goal: Ability to maintain appropriate glucose levels will improve Outcome: Progressing   Problem: Nutritional: Goal: Maintenance of adequate nutrition will improve Outcome: Progressing Goal: Progress toward achieving an optimal weight will improve Outcome: Progressing   Problem: Skin Integrity: Goal: Risk for impaired skin integrity will decrease Outcome: Progressing   Problem: Tissue Perfusion: Goal: Adequacy of tissue perfusion will improve Outcome: Progressing   Problem: Education: Goal: Knowledge of General Education information will improve Description: Including pain rating scale, medication(s)/side effects and non-pharmacologic comfort measures Outcome: Progressing   Problem: Health Behavior/Discharge Planning: Goal: Ability to manage health-related needs will improve Outcome: Progressing   Problem: Clinical Measurements: Goal: Ability to maintain clinical measurements within normal limits will improve Outcome: Progressing Goal: Will remain free from infection Outcome: Progressing Goal: Diagnostic test results will improve Outcome: Progressing Goal: Respiratory complications will improve Outcome: Progressing Goal: Cardiovascular complication will  be avoided Outcome: Progressing   Problem: Activity: Goal: Risk for activity intolerance will decrease Outcome: Progressing   Problem: Nutrition: Goal: Adequate nutrition will be maintained Outcome: Progressing   Problem: Coping: Goal: Level of anxiety will decrease Outcome: Progressing   Problem: Elimination: Goal: Will not experience complications related to bowel motility Outcome: Progressing Goal: Will not experience complications related to urinary retention Outcome: Progressing   Problem: Pain Managment: Goal: General experience of comfort will improve and/or be controlled Outcome: Progressing   Problem: Safety: Goal: Ability to remain free from injury will improve Outcome: Progressing   Problem: Skin Integrity: Goal: Risk for impaired skin integrity will decrease Outcome: Progressing   Problem: Education: Goal: Verbalization of understanding the information provided (i.e., activity precautions, restrictions, etc) will improve Outcome: Progressing Goal: Individualized Educational Video(s) Outcome: Progressing   Problem: Activity: Goal: Ability to ambulate and perform ADLs will improve Outcome: Progressing   Problem: Clinical Measurements: Goal: Postoperative complications will be avoided or minimized Outcome: Progressing   Problem: Self-Concept: Goal: Ability to maintain and perform role responsibilities to the fullest extent possible will improve Outcome: Progressing   Problem: Pain Management: Goal: Pain level will decrease Outcome: Progressing

## 2023-09-16 ENCOUNTER — Encounter (HOSPITAL_COMMUNITY): Payer: Self-pay | Admitting: Orthopedic Surgery

## 2023-09-16 ENCOUNTER — Inpatient Hospital Stay (HOSPITAL_COMMUNITY): Payer: Medicare Other

## 2023-09-16 DIAGNOSIS — S72491A Other fracture of lower end of right femur, initial encounter for closed fracture: Secondary | ICD-10-CM | POA: Diagnosis not present

## 2023-09-16 LAB — GLUCOSE, CAPILLARY
Glucose-Capillary: 183 mg/dL — ABNORMAL HIGH (ref 70–99)
Glucose-Capillary: 168 mg/dL — ABNORMAL HIGH (ref 70–99)
Glucose-Capillary: 140 mg/dL — ABNORMAL HIGH (ref 70–99)
Glucose-Capillary: 166 mg/dL — ABNORMAL HIGH (ref 70–99)

## 2023-09-16 LAB — CBC
HCT: 31.3 % — ABNORMAL LOW (ref 36.0–46.0)
Hemoglobin: 9.7 g/dL — ABNORMAL LOW (ref 12.0–15.0)
MCH: 29.6 pg (ref 26.0–34.0)
MCHC: 31 g/dL (ref 30.0–36.0)
MCV: 95.4 fL (ref 80.0–100.0)
Platelets: 208 10*3/uL (ref 150–400)
RBC: 3.28 MIL/uL — ABNORMAL LOW (ref 3.87–5.11)
RDW: 13.8 % (ref 11.5–15.5)
WBC: 11.1 10*3/uL — ABNORMAL HIGH (ref 4.0–10.5)
nRBC: 0 % (ref 0.0–0.2)

## 2023-09-16 NOTE — TOC Progression Note (Addendum)
 Transition of Care [facility]) - Progression Note    Patient Details  Name: Rhonda Watkins MRN: [id number] Date of Birth: 26-Mar-1932  Transition [facility]) CM/SW Contact  Harlene Sharps, LCSW Phone Number: 09/16/2023, 10:48 AM  Clinical Narrative:    CSW advised pt is medically stable for DC. CSW spoke with son and provided bed offers with Medicare ratings. CSW to start authorization when bed is chosen. TOC will continue to follow.   2:50 Auth pending contracted  3:30 Family has chosen [name], [place] updated on facility choice. Plan for DC tomorrow. TOC will continue to follow.  Expected Discharge Plan: Skilled Nursing Facility Barriers to Discharge: Insurance Authorization, SNF Pending bed offer, Continued Medical Work up  Expected Discharge Plan and Services In-house Referral: Clinical Social Work   Post Acute Care Choice: Skilled Nursing Facility Living arrangements for the past 2 months: Independent Living Facility                                       Social Determinants of Health (SDOH) Interventions SDOH Screenings   Food Insecurity: Patient Unable To Answer (09/13/2023)  Housing: Patient Unable To Answer (09/13/2023)  Transportation Needs: Patient Unable To Answer (09/13/2023)  Utilities: Patient Unable To Answer (09/13/2023)  Depression (PHQ2-9): Low Risk  (03/19/2020)  Social Connections: Patient Unable To Answer (09/13/2023)  Tobacco Use: Medium Risk (09/13/2023)    Readmission Risk Interventions     No data to display

## 2023-09-16 NOTE — Progress Notes (Signed)
 PROGRESS NOTE    Rhonda Watkins  FMW:[id number] DOB: 10-20-1931 DOA: 09/12/2023 PCP: Charlott Dorn LABOR, MD   Brief Narrative: Rhonda Watkins is a 89 y.o. female with a history of hypertension, hyperlipidemia, GERD, spinal stenosis with chronic back pain, asthma.  Patient presented secondary to a fall and found to have a right femur fracture. Orthopedic surgery consulted for repair.   Assessment and Plan:  Right femur fracture Secondary to fall which may have been related to chronic back pain. CT evidence significant for an acute comminuted spiral-oriented fracture of the distal femoral diaphysis. Orthopedic surgery consulted and performed an ORIF on 2/3. -PT/OT recommendations: SNF -Ongoing orthopedic surgery recommendations: partial weight bearing (25%) RLE -Continue analgesics  Left wrist fracture Possible based on x-ray imaging; possibly an old injury. Splint placed. -Orthopedic surgery recommendations for management  Acute blood loss anemia Likely secondary to acute fracture and perioperative bleeding. Hemoglobin of 13.0 on admission. Drift downward but is now stabilized.  Fall Reported as a mechanical fall. No mention of syncope.  Primary hypertension Patient is not on antihypertensive therapy as an outpatient.  Hyperlipidemia -Continue Lipitor  GERD -Continue Protonix   Lower extremity edema Appears to be chronic. Patient has been evaluated for acute DVT in the past. No evidence of heart failure.  Diabetes mellitus type 2 Poorly controlled with hyperglycemia. Hemoglobin A1C of 8.3%. Patient is on Jardiance as an outpatient. -Continue SSI  Spinal stenosis Herniated disc at L2-3 Chronic back pain Noted. Patient follows with Neurosurgery, Dr. Onetha. Neurosurgery consulted this admission by orthopedic surgery and have recommended outpatient follow-up. -Continue analgesics -Air mattress  Asthma -Continue albuterol   Right knee osteoarthritis Severe disease  with small-moderate effusion noted on imaging.   DVT prophylaxis: Per orthopedic surgery; Lovenox , SCDs, ambulation Code Status:   Code Status: Full Code Family Communication: None at bedside. Disposition Plan: Discharge to SNF pending bed availability   Consultants:  Orthopedic surgery Neurosurgery  Procedures:  ORIF distal femur fracture  Antimicrobials: None    Subjective: No overnight events noted.  Objective: BP (!) 112/41 (BP Location: Right Arm)   Pulse 73   Temp 98.2 F (36.8 C)   Resp 16   Ht 5' 2 (1.575 m)   Wt 56.2 kg   SpO2 99%   BMI 22.66 kg/m   Examination:  General exam: Appears calm and comfortable Respiratory system: Respiratory effort normal. Gastrointestinal system: Abdomen is nondistended, soft and nontender. Normal bowel sounds heard. Musculoskeletal: Bilateral LE edema.   Data Reviewed: I have personally reviewed following labs and imaging studies  CBC Lab Results  Component Value Date   WBC 11.1 (H) 09/16/2023   RBC 3.28 (L) 09/16/2023   HGB 9.7 (L) 09/16/2023   HCT 31.3 (L) 09/16/2023   MCV 95.4 09/16/2023   MCH 29.6 09/16/2023   PLT 208 09/16/2023   MCHC 31.0 09/16/2023   RDW 13.8 09/16/2023   LYMPHSABS 1.4 09/12/2023   MONOABS 1.1 (H) 09/12/2023   EOSABS 0.0 09/12/2023   BASOSABS 0.0 09/12/2023     Last metabolic panel Lab Results  Component Value Date   NA 138 09/13/2023   K 3.8 09/13/2023   CL 108 09/13/2023   CO2 18 (L) 09/13/2023   BUN 10 09/13/2023   CREATININE 0.68 09/13/2023   GLUCOSE 180 (H) 09/13/2023   GFRNONAA >60 09/13/2023   GFRAA >60 05/15/2020   CALCIUM  9.0 09/13/2023   PHOS 3.6 11/11/2022   PROT 4.9 (L) 11/10/2022   ALBUMIN  2.2 (L) 11/10/2022  BILITOT 0.5 11/10/2022   ALKPHOS 57 11/10/2022   AST 14 (L) 11/10/2022   ALT 12 11/10/2022   ANIONGAP 12 09/13/2023    GFR: Estimated Creatinine Clearance: 37.7 mL/min (by C-G formula based on SCr of 0.68 mg/dL).  Recent Results (from the past  240 hours)  Surgical PCR screen     Status: None   Collection Time: 09/12/23 10:29 PM   Specimen: Nasal Mucosa; Nasal Swab  Result Value Ref Range Status   MRSA, PCR NEGATIVE NEGATIVE Final   Staphylococcus aureus NEGATIVE NEGATIVE Final    Comment: (NOTE) The Xpert SA Assay (FDA approved for NASAL specimens in patients 25 years of age and older), is one component of a comprehensive surveillance program. It is not intended to diagnose infection nor to guide or monitor treatment. Performed at [facility], [address] [place], [place] [address]       Radiology Studies: No results found.     LOS: 4 days    Elgin Lam, MD [facility] 09/16/2023, 10:17 AM   If 7PM-7AM, please contact night-coverage [email or web address]

## 2023-09-16 NOTE — Progress Notes (Addendum)
 Physical Therapy Treatment Patient Details Name: Rhonda Watkins MRN: [id number] DOB: 06/01/1932 Today's Date: 09/16/2023   History of Present Illness Pt is an 89 y/o female who presents 09/12/2023 s/p fall. Pt sustained a R distal femur fracture and L wrist injury. She is now s/p ORIF femur on 09/13/2023 (25% PWB and K.I. at all times). PMH significant for HTN, spinal stenosis with chronic back pain, asthma.    PT Comments  Pt received in supine, awake and alert, oriented x3, pleasantly agreeable to further instruction on supine BLE exercise handout, use of IS and pressure relief strategies/technique. Pt assisted to don Prevalon boots due to high risk of pressure injury given recent immobility and pt agreeable (per RN she had refused them earlier) and son arrived during session. Pt and son instructed on the above and son Rhonda Watkins requesting MD do more imaging of her back, MD/RN notified of his request. Earlier in the day pt had refused air bed, but per pt and son she is now agreeable, RN and licensed conveyancer notified so they can reorder her air bed to protect her skin. Pt's son instructed on her current weight bearing guidelines per MD orders/notes and had some concern that she has not been able to stand yet, reviewed PWB 25%, pt current functional level/poor seated balance and plan for next session is to attempt hoyer OOB to chair and possibly transfers from chair if pt oriented enough to follow commands, and if pain allows. Pt will continue to benefit from skilled rehab in a post acute setting to maximize functional gains before returning home.     If plan is discharge home, recommend the following: Two people to help with walking and/or transfers;Two people to help with bathing/dressing/bathroom;Direct supervision/assist for medications management;Direct supervision/assist for financial management;Assist for transportation;Help with stairs or ramp for entrance;Supervision due to cognitive status   Can travel by  private vehicle     No  Equipment Recommendations  None recommended by PT (TBD by next venue of care, currently hoyer level)    Recommendations for Other Services       Precautions / Restrictions Precautions Precautions: Back;Fall Precaution Booklet Issued: No Precaution Comments: 2L [place] , Required Braces or Orthoses: Knee Immobilizer - Right;Splint/Cast Knee Immobilizer - Right: On at all times Splint/Cast: wrist cock up L wrist UE Splint/Cast - Date Prophylactic Dressing Applied (if applicable): 09/13/23 Restrictions Weight Bearing Restrictions Per Provider Order: Yes LUE Weight Bearing Per Provider Order: Weight bearing as tolerated (w/ wrist brace on) RLE Weight Bearing Per Provider Order: Partial weight bearing RLE Partial Weight Bearing Percentage or Pounds: 25% Other Position/Activity Restrictions: LUE ice and elevate per ortho notes     Mobility  Bed Mobility Overal bed mobility: Needs Assistance Bed Mobility: Sit to Supine, Rolling, Sidelying to Sit Rolling: Total assist, +2 for physical assistance, +2 for safety/equipment, Used rails Sidelying to sit: Total assist, +2 for physical assistance, Used rails   Sit to supine: Total assist, +2 for physical assistance, +2 for safety/equipment, HOB elevated   General bed mobility comments: Defer, pt fatigued, did sit up earlier in the day.    Transfers                   General transfer comment: Defer, pt worked on seated scooting earlier in the day    Ambulation/Gait                   Stairs  Wheelchair Mobility     Tilt Bed    Modified Rankin (Stroke Patients Only)       Balance Overall balance assessment: Needs assistance Sitting-balance support: Single extremity supported, Feet supported, Bilateral upper extremity supported Sitting balance-Leahy Scale: Poor Sitting balance - Comments: emphasis on bed-level education and caregiver education in second session Postural  control: Left lateral lean     Standing balance comment: Pt not yet able to attempt                            Cognition Arousal: Alert Behavior During Therapy: Flat affect Overall Cognitive Status: Impaired/Different from baseline Area of Impairment: Orientation, Memory, Awareness                 Orientation Level: Disoriented to, Place, Time, Situation   Memory: Decreased short-term memory     Awareness: Intellectual   General Comments: Upon re-entry to her room, pt oriented to location, reason for admission, year, not oriented to month but may have been confused due to [facility] status. Orientation appears to be waxing and waning throughout the day. Pt son arriving during session and expressed concerns about her level of function, PTA explained likely progression of therapies and interventions staff are using to try minimizing delirium including opening her blinds, trying to promote daytime alertness, reorienting her to self/situation, etc. Pt and son also notified they can request chaplain visit her due to pt expressed grief related to losing her spouse, he defers at this time as she may discharge the following date.        Exercises Other Exercises Other Exercises: Pt instructed on use of IS (handout given to reinforce), ankle pumps x10, quad sets, hip abduction, and given information about pressure relief prevention and strategies to reduce risk of pressure injury (PTA highlighted relevant portions). Pt appreciative Other Exercises: static sitting ~10 mins with emphasis on core activation and self-supporting with L elbow and RUE    General Comments General comments (skin integrity, edema, etc.): BP 112/57 (68) sitting EOB HR 89 bpm taken on RUE; BP 105/59 (72) HR 85 bpm with HOB at 53 degrees at end of session in supine. SpO2 WFL on RA      Pertinent Vitals/Pain Pain Assessment Pain Assessment: Faces Faces Pain Scale: Hurts little more Pain Location:  Generalized-- at times reports R LE and then others reports back. Pain Descriptors / Indicators: Discomfort, Grimacing, Guarding Pain Intervention(s): Limited activity within patient's tolerance, Monitored during session, Repositioned, Premedicated before session    Home Living                          Prior Function            PT Goals (current goals can now be found in the care plan section) Acute Rehab PT Goals Patient Stated Goal: less pain PT Goal Formulation: Patient unable to participate in goal setting Time For Goal Achievement: 09/28/23 Progress towards PT goals: Progressing toward goals    Frequency    Min 1X/week      PT Plan      Co-evaluation              AM-PAC PT 6 Clicks Mobility   Outcome Measure  Help needed turning from your back to your side while in a flat bed without using bedrails?: Total Help needed moving from lying on your back to sitting on the side  of a flat bed without using bedrails?: Total Help needed moving to and from a bed to a chair (including a wheelchair)?: Total Help needed standing up from a chair using your arms (e.g., wheelchair or bedside chair)?: Total Help needed to walk in hospital room?: Total Help needed climbing 3-5 steps with a railing? : Total 6 Click Score: 6    End of Session Equipment Utilized During Treatment: Oxygen Activity Tolerance: Patient limited by pain;Other (comment) (Cognition) Patient left: in bed;with call bell/phone within reach;with bed alarm set;Other (comment) (prevalon boots donned) Nurse Communication: Mobility status PT Visit Diagnosis: Pain;History of falling (Z91.81) Pain - Right/Left: Right Pain - part of body: Hip;Leg;Knee     Time: 1730-1747 PT Time Calculation (min) (ACUTE ONLY): 17 min  Charges:     $Therapeutic Activity: 8-22 mins PT General Charges $$ ACUTE PT VISIT: 1 Visit                     Robin Petrakis P., PTA Acute Rehabilitation Services Secure Chat  Preferred 9a-5:30pm Office: [phone]    Connell Rhonda Watkins 09/16/2023, 6:20 PM

## 2023-09-16 NOTE — Progress Notes (Signed)
 Physical Therapy Treatment Patient Details Name: Rhonda Watkins MRN: [id number] DOB: [birth year] Today's Date: 09/16/2023   History of Present Illness Pt is an 89 y/o female who presents 09/12/2023 s/p fall. Pt sustained a R distal femur fracture and L wrist injury. She is now s/p ORIF femur on 09/13/2023 (25% PWB and K.I. at all times). PMH significant for HTN, spinal stenosis with chronic back pain, asthma.    PT Comments  Pt received in supine, lethargic after premedication for pain but with fair participation and tolerance for transfer to EOB and static sitting. Pt with L lean and c/o fatigue sitting EOB BP stable from sit to supine when checked. Pt tolerated sitting ~10 mins at EOB and damp linens changed while pt seated. Pt needing up to +2 maxA for lateral seated scoot toward HOB (x1 successful scoot) and totalA for transfer to/from EOB due to pain and needing RLE constant physical assist. MD ordered air bed and Prevalon boots for her but not yet arrived to her room, RN/unit diplomatic services operational officer notified. Pt will continue to benefit from skilled rehab in a post acute setting to maximize functional gains before returning home.     If plan is discharge home, recommend the following: Two people to help with walking and/or transfers;Two people to help with bathing/dressing/bathroom;Direct supervision/assist for medications management;Direct supervision/assist for financial management;Assist for transportation;Help with stairs or ramp for entrance;Supervision due to cognitive status   Can travel by private vehicle     No  Equipment Recommendations  None recommended by PT (TBD by next venue of care, currently hoyer level)    Recommendations for Other Services       Precautions / Restrictions Precautions Precautions: Back;Fall Precaution Booklet Issued: No Precaution Comments: 2L [place] , Required Braces or Orthoses: Knee Immobilizer - Right;Splint/Cast Knee Immobilizer - Right: On at all times Splint/Cast:  wrist cock up L wrist UE Splint/Cast - Date Prophylactic Dressing Applied (if applicable): 09/13/23 Restrictions Weight Bearing Restrictions Per Provider Order: Yes LUE Weight Bearing Per Provider Order: Weight bearing as tolerated (w/ wrist brace on) RLE Weight Bearing Per Provider Order: Partial weight bearing RLE Partial Weight Bearing Percentage or Pounds: 25% Other Position/Activity Restrictions: LUE ice and elevate per ortho notes     Mobility  Bed Mobility Overal bed mobility: Needs Assistance Bed Mobility: Sit to Supine, Rolling, Sidelying to Sit Rolling: Total assist, +2 for physical assistance, +2 for safety/equipment, Used rails Sidelying to sit: Total assist, +2 for physical assistance, Used rails   Sit to supine: Total assist, +2 for physical assistance, +2 for safety/equipment, HOB elevated   General bed mobility comments: log roll to R EOB with increased time and dense cues, and pt only minimally assisting to bring legs toward R EOB when cued. L lean once sitting upright; totalA to return to supine after student RN placed clean bed linens for her.    Transfers                   General transfer comment: unable to attempt standing this session, pt not following commands well enough and poor seated balance, somewhat lethargic. Pt +2 maxA for lateral seated scoot toward her R side x1 rep, but fatigues after that time.    Ambulation/Gait                   Stairs             Wheelchair Mobility     Tilt Bed    Modified  Rankin (Stroke Patients Only)       Balance Overall balance assessment: Needs assistance Sitting-balance support: Single extremity supported, Feet supported, Bilateral upper extremity supported Sitting balance-Leahy Scale: Poor Sitting balance - Comments: L elbow propped on pillow for support and RUE on bed side rail or mattress. Tending to lean toward her L side needing variable minA to maxA for trunk support. Postural  control: Left lateral lean     Standing balance comment: Pt not yet able to attempt                            Cognition Arousal: Lethargic Behavior During Therapy: Flat affect Overall Cognitive Status: Impaired/Different from baseline Area of Impairment: Orientation, Memory, Awareness                 Orientation Level: Disoriented to, Place, Time, Situation   Memory: Decreased short-term memory     Awareness: Intellectual   General Comments: Sleeping upon PTA arrival but awakens to loud voice; pt HoH. Pt remains somewhat drowsy throughout. Pt appears inconsistently oriented and tangential, seeming unaware of being in a damp bed due to purewick leaking but relucant to mobilize due to pain. Pt tearful/labile and RN notified she may benefit from chaplain consult. Pt reports call bell does not work so diplomatic services operational officer notified that pt is HoH and when she presses button, they will need to send staff even if she does not respond to diplomatic services operational officer.        Exercises Other Exercises Other Exercises: supine BLE AROM: ankle pumps x10 reps, hip ab/adduction x5 reps ea Other Exercises: static sitting ~10 mins with emphasis on core activation and self-supporting with L elbow and RUE    General Comments General comments (skin integrity, edema, etc.): BP 112/57 (68) sitting EOB HR 89 bpm taken on RUE; BP 105/59 (72) HR 85 bpm with HOB at 53 degrees at end of session in supine. SpO2 WFL on RA      Pertinent Vitals/Pain Pain Assessment Pain Assessment: Faces Faces Pain Scale: Hurts even more Pain Location: Generalized-- at times reports R LE and then others reports back. Pain Descriptors / Indicators: Discomfort, Grimacing, Guarding Pain Intervention(s): Limited activity within patient's tolerance, Monitored during session, Premedicated before session, Repositioned, Patient requesting pain meds-RN notified    Home Living                          Prior Function             PT Goals (current goals can now be found in the care plan section) Acute Rehab PT Goals Patient Stated Goal: less pain PT Goal Formulation: Patient unable to participate in goal setting Time For Goal Achievement: 09/28/23 Progress towards PT goals: Progressing toward goals    Frequency    Min 1X/week      PT Plan      Co-evaluation              AM-PAC PT 6 Clicks Mobility   Outcome Measure  Help needed turning from your back to your side while in a flat bed without using bedrails?: Total Help needed moving from lying on your back to sitting on the side of a flat bed without using bedrails?: Total Help needed moving to and from a bed to a chair (including a wheelchair)?: Total Help needed standing up from a chair using your arms (e.g., wheelchair or bedside chair)?: Total  Help needed to walk in hospital room?: Total Help needed climbing 3-5 steps with a railing? : Total 6 Click Score: 6    End of Session Equipment Utilized During Treatment: Oxygen Activity Tolerance: Patient limited by pain;Other (comment) (Cognition) Patient left: in bed;with call bell/phone within reach;with bed alarm set Nurse Communication: Mobility status PT Visit Diagnosis: Pain;History of falling (Z91.81) Pain - Right/Left: Right Pain - part of body: Hip;Leg;Knee     Time: 8560-8480 PT Time Calculation (min) (ACUTE ONLY): 40 min  Charges:    $Therapeutic Exercise: 8-22 mins $Therapeutic Activity: 23-37 mins PT General Charges $$ ACUTE PT VISIT: 1 Visit                     Anaira Seay P., PTA Acute Rehabilitation Services Secure Chat Preferred 9a-5:30pm Office: [phone]    Connell HERO [facility] 09/16/2023, 3:49 PM

## 2023-09-17 DIAGNOSIS — S72491A Other fracture of lower end of right femur, initial encounter for closed fracture: Secondary | ICD-10-CM | POA: Diagnosis not present

## 2023-09-17 LAB — CBC
HCT: 28.1 % — ABNORMAL LOW (ref 36.0–46.0)
Hemoglobin: 9.3 g/dL — ABNORMAL LOW (ref 12.0–15.0)
MCH: 30.5 pg (ref 26.0–34.0)
MCHC: 33.1 g/dL (ref 30.0–36.0)
MCV: 92.1 fL (ref 80.0–100.0)
Platelets: 252 10*3/uL (ref 150–400)
RBC: 3.05 MIL/uL — ABNORMAL LOW (ref 3.87–5.11)
RDW: 13.6 % (ref 11.5–15.5)
WBC: 10.6 10*3/uL — ABNORMAL HIGH (ref 4.0–10.5)
nRBC: 0 % (ref 0.0–0.2)

## 2023-09-17 LAB — GLUCOSE, CAPILLARY
Glucose-Capillary: 275 mg/dL — ABNORMAL HIGH (ref 70–99)
Glucose-Capillary: 208 mg/dL — ABNORMAL HIGH (ref 70–99)
Glucose-Capillary: 142 mg/dL — ABNORMAL HIGH (ref 70–99)

## 2023-09-17 MED ORDER — DOCUSATE SODIUM 100 MG PO CAPS: 100.0000 mg | ORAL_CAPSULE | Freq: Two times a day (BID) | ORAL | Status: AC

## 2023-09-17 MED ORDER — HYDROCODONE-ACETAMINOPHEN 5-325 MG PO TABS: 1.0000 | ORAL_TABLET | Freq: Four times a day (QID) | ORAL | 0 refills | Status: DC | PRN

## 2023-09-17 MED ORDER — POLYETHYLENE GLYCOL 3350 17 G PO PACK: 17.0000 g | PACK | Freq: Every day | ORAL | Status: DC | PRN

## 2023-09-17 MED ORDER — METHOCARBAMOL 500 MG PO TABS: 500.0000 mg | ORAL_TABLET | Freq: Four times a day (QID) | ORAL | Status: DC | PRN

## 2023-09-17 MED ORDER — ENOXAPARIN SODIUM 40 MG/0.4ML IJ SOSY: 40.0000 mg | PREFILLED_SYRINGE | INTRAMUSCULAR | Status: DC

## 2023-09-17 NOTE — TOC Progression Note (Addendum)
 Transition of Care [facility]) - Progression Note    Patient Details  Name: Rhonda Watkins MRN: [id number] Date of Birth: 25-Aug-1931  [facility] CM/SW Contact  Harlene Sharps, LCSW Phone Number: 09/17/2023, 12:26 PM  Clinical Narrative:    CSW advised pt is medically stable to DC to SNF. Authorization still pending, requesting updated PT notes with out of bed mobility. New PT notes were uploaded and team made aware of current barriers. Mobility and OT were able to get pt to recliner, will upload notes when available. TOC will continue to follow.   1:15 Additional clinicals uploaded, son updated to barriers.   Expected Discharge Plan: Skilled Nursing Facility Barriers to Discharge: Insurance Authorization, SNF Pending bed offer, Continued Medical Work up  Expected Discharge Plan and Services In-house Referral: Clinical Social Work   Post Acute Care Choice: Skilled Nursing Facility Living arrangements for the past 2 months: Independent Living Facility                                       Social Determinants of Health (SDOH) Interventions SDOH Screenings   Food Insecurity: Patient Unable To Answer (09/13/2023)  Housing: Patient Unable To Answer (09/13/2023)  Transportation Needs: Patient Unable To Answer (09/13/2023)  Utilities: Patient Unable To Answer (09/13/2023)  Depression (PHQ2-9): Low Risk  (03/19/2020)  Social Connections: Patient Unable To Answer (09/13/2023)  Tobacco Use: Medium Risk (09/13/2023)    Readmission Risk Interventions     No data to display

## 2023-09-17 NOTE — Discharge Summary (Signed)
 Physician Discharge Summary   Rhonda Watkins  female DOB: October 29, 1931  FMW:[id number]  PCP: Charlott Dorn LABOR, MD  Admit date: 09/12/2023 Discharge date: 09/17/2023  Admitted From: independent living Disposition:  SNF rehab CODE STATUS: Full code   Hospital Course:  For full details, please see H&P, progress notes, consult notes and ancillary notes.  Briefly,  Rhonda Watkins is a 89 y.o. female with a history of hypertension, spinal stenosis with chronic back pain, asthma.  Patient presented secondary to a fall and found to have a right femur fracture. Orthopedic surgery consulted for repair.   Right femur fracture S/p ORIF on 2/3. Secondary to fall which may have been related to chronic back pain. CT evidence significant for an acute comminuted spiral-oriented fracture of the distal femoral diaphysis.  -PT/OT recommendations: SNF -Ongoing orthopedic surgery recommendations: partial weight bearing (25%) RLE --Dressings left intact until follow-up and Reinforce dressings as needed  --Lovenox  40mg  qd for ppx --outpatient f/u with ortho in 2 weeks  Left wrist fracture Possible based on x-ray imaging; possibly an old injury. Splint placed. --outpatient f/u with ortho in 2 weeks  Acute blood loss anemia Likely secondary to acute fracture and perioperative bleeding. Hemoglobin of 13.0 on admission. Drift downward but is now stabilized around 9's.   Fall Reported as a mechanical fall.  No mention of syncope.   Primary hypertension --home lasix  continued. --home telmisartan d/c'ed.   Hyperlipidemia -Continue Lipitor   GERD -Continue Protonix    Lower extremity edema Appears to be chronic. Patient has been evaluated for acute DVT in the past. No evidence of heart failure.   Diabetes mellitus type 2 Poorly controlled with hyperglycemia. Hemoglobin A1C of 8.3%.  --resume home Lantus  after discharge.   Spinal stenosis Herniated disc at L2-3 Chronic back pain Patient  follows with Neurosurgery, Dr. Onetha. Neurosurgery consulted this admission by orthopedic surgery and have recommended outpatient follow-up.  Xray of thoracic and lumbar spine showed no new acute findings. -Continue analgesics   Asthma -Continue albuterol  PRN   Right knee osteoarthritis Severe disease with small-moderate effusion noted on imaging.   Discharge Diagnoses:  Principal Problem:   Femur fracture, right (HCC) Active Problems:   Essential hypertension   GERD (gastroesophageal reflux disease)   Spinal stenosis   Hypercholesterolemia   Lumbago of lumbar region with sciatica   Mild persistent asthma, uncomplicated   Type 2 diabetes mellitus with other specified complication (HCC)   30 Day Unplanned Readmission Risk Score    Flowsheet Row ED to Hosp-Admission (Current) from 09/12/2023 in [facility]  SURGICAL  30 Day Unplanned Readmission Risk Score (%) 20.51 Filed at 09/17/2023 1200       This score is the patient's risk of an unplanned readmission within 30 days of being discharged (0 -100%). The score is based on dignosis, age, lab data, medications, orders, and past utilization.   Low:  0-14.9   Medium: 15-21.9   High: 22-29.9   Extreme: 30 and above         Discharge Instructions:  Allergies as of 09/17/2023       Reactions   Jardiance [empagliflozin] Other (See Comments)   Fatigue    Metformin And Related Other (See Comments)   Fatigue    Tradjenta [linagliptin] Other (See Comments)   Unknown reaction        Medication List     STOP taking these medications    potassium chloride  8 MEQ tablet Commonly known as:  KLOR-CON    telmisartan 40 MG tablet Commonly known as: MICARDIS       TAKE these medications    acetaminophen  650 MG CR tablet Commonly known as: Tylenol  8 Hour Take 1 tablet (650 mg total) by mouth every 6 (six) hours as needed for pain or fever.   albuterol  108 (90 Base) MCG/ACT inhaler Commonly known as:  VENTOLIN  HFA Inhale 1-2 puffs into the lungs every 6 (six) hours as needed for wheezing or shortness of breath.   atorvastatin  10 MG tablet Commonly known as: LIPITOR Take 10 mg by mouth daily.   docusate sodium  100 MG capsule Commonly known as: COLACE Take 1 capsule (100 mg total) by mouth 2 (two) times daily.   enoxaparin  40 MG/0.4ML injection Commonly known as: LOVENOX  Inject 0.4 mLs (40 mg total) into the skin daily for 14 days. Start taking on: September 18, 2023   famotidine  40 MG tablet Commonly known as: PEPCID  Take 40 mg by mouth every evening.   furosemide  40 MG tablet Commonly known as: LASIX  Take 40 mg by mouth daily.   gabapentin  100 MG capsule Commonly known as: Neurontin  Take 1 capsule (100 mg total) by mouth 3 (three) times daily for 14 days.   HYDROcodone -acetaminophen  5-325 MG tablet Commonly known as: NORCO/VICODIN Take 1 tablet by mouth every 6 (six) hours as needed for moderate pain (pain score 4-6) or severe pain (pain score 7-10).   Lantus  SoloStar 100 UNIT/ML Solostar Pen Generic drug: insulin  glargine Inject 12 Units into the skin at bedtime.   methocarbamol  500 MG tablet Commonly known as: ROBAXIN  Take 1 tablet (500 mg total) by mouth every 6 (six) hours as needed for muscle spasms.   omeprazole  20 MG capsule Commonly known as: PRILOSEC  Take 1 capsule (20 mg total) by mouth daily as needed (for heartburn).   polyethylene glycol 17 g packet Commonly known as: MIRALAX  / GLYCOLAX  Take 17 g by mouth daily as needed (constipation).         Contact information for follow-up providers     Beverley Evalene BIRCH, MD. Schedule an appointment as soon as possible for a visit in 2 week(s).   Specialty: Orthopedic Surgery Contact information: [address] [place] [address] [phone]         Charlott Dorn LABOR, MD Follow up.   Specialty: Internal Medicine Contact information: [address] [place] [address] [phone]              Contact information for after-discharge care     Destination     [facility] .   Service: Skilled Nursing Contact information: [address] [place]  [address] [phone]                     Allergies  Allergen Reactions   Jardiance [Empagliflozin] Other (See Comments)    Fatigue    Metformin And Related Other (See Comments)    Fatigue    Tradjenta [Linagliptin] Other (See Comments)    Unknown reaction     The results of significant diagnostics from this hospitalization (including imaging, microbiology, ancillary and laboratory) are listed below for reference.   Consultations:   Procedures/Studies: DG Lumbar Spine 2-3 Views Result Date: 09/16/2023 CLINICAL DATA:  Chronic back pain EXAM: LUMBAR SPINE - 2-3 VIEW COMPARISON:  07/13/2023 FINDINGS: The osseous structures are diffusely osteopenic. L3-4 anterior posterior lumbar fusion with instrumentation has been performed. No acute fracture of the  lumbar spine. Chronic compression deformity L1 noted with mild loss of height. Remaining vertebral body heights are preserved. There is intervertebral disc space narrowing and endplate remodeling throughout the lumbar spine in keeping with changes of diffuse advanced degenerative disc disease. Vascular calcifications are noted. IMPRESSION: 1. L3-4 anterior posterior lumbar fusion with instrumentation. 2. Diffuse advanced degenerative disc disease. 3. No acute fracture or listhesis. Electronically Signed   By: Dorethia Molt M.D.   On: 09/16/2023 23:13   DG Thoracic Spine 2 View Result Date: 09/16/2023 CLINICAL DATA:  Chronic back pain EXAM: THORACIC SPINE 2 VIEWS COMPARISON:  None Available. FINDINGS: Osseous structures are diffusely osteopenic. No acute fracture or listhesis. Endplate changes are present in keeping with moderate diffuse degenerative disc disease. Paraspinal soft tissues are  unremarkable. IMPRESSION: 1. Moderate diffuse degenerative disc disease. Osteopenia. Electronically Signed   By: Dorethia Molt M.D.   On: 09/16/2023 23:09   DG FEMUR, MIN 2 VIEWS RIGHT Result Date: 09/13/2023 CLINICAL DATA:  Open reduction internal fixation distal femur fracture. EXAM: RIGHT FEMUR 2 VIEWS COMPARISON:  09/12/2023. FINDINGS: Five intraoperative fluoroscopic spot views of the distal right femur show lateral plate and screw fixation of a spiral fracture of the distal femoral shaft. Fracture fragments are in better anatomic alignment. Overall osseous detail degraded by technique. IMPRESSION: ORIF distal femur fracture. Electronically Signed   By: Newell Eke M.D.   On: 09/13/2023 14:50   DG C-Arm 1-60 Min-No Report Result Date: 09/13/2023 Fluoroscopy was utilized by the requesting physician.  No radiographic interpretation.   DG C-Arm 1-60 Min-No Report Result Date: 09/13/2023 Fluoroscopy was utilized by the requesting physician.  No radiographic interpretation.   CT FEMUR RIGHT WO CONTRAST Result Date: 09/12/2023 CLINICAL DATA:  Upper leg trauma EXAM: CT OF THE LOWER RIGHT EXTREMITY WITHOUT CONTRAST TECHNIQUE: Multidetector CT imaging of the right lower extremity was performed according to the standard protocol. RADIATION DOSE REDUCTION: This exam was performed according to the departmental dose-optimization program which includes automated exposure control, adjustment of the mA and/or kV according to patient size and/or use of iterative reconstruction technique. COMPARISON:  X-ray 09/12/2023 FINDINGS: Bones/Joint/Cartilage Acute comminuted, spiral oriented fracture of the distal femoral diaphysis. Mild-moderate anteromedial displacement and mild posterior apex angulation. 12.7 cm butterfly fragment with slight anteromedial displacement. Fracture does not extend intra-articularly to involve the knee joint. No lytic or sclerotic changes to the bone to suggest a pathologic fracture. The bones  are demineralized. Hip joint intact with mild to moderate osteoarthritis. No hip joint effusion. Severe tricompartmental osteoarthritis of the right knee with bone-on-bone apposition in the medial compartment. Small-moderate size knee joint effusion. Ligaments Suboptimally assessed by CT. Muscles and Tendons Ill-defined hematoma within the anterior compartment musculature of the mid to distal right thigh. Soft tissues Soft tissue swelling at the fracture site. No right inguinal lymphadenopathy. IMPRESSION: 1. Acute comminuted, spiral-oriented fracture of the distal femoral diaphysis, as described. 2. Ill-defined hematoma within the anterior compartment musculature of the mid to distal right thigh. 3. Severe tricompartmental osteoarthritis of the right knee. Small-moderate size knee joint effusion. Electronically Signed   By: Mabel Converse D.O.   On: 09/12/2023 13:44   DG Wrist Complete Left Result Date: 09/12/2023 CLINICAL DATA:  Fall, left wrist pain EXAM: LEFT WRIST - COMPLETE 3+ VIEW COMPARISON:  None Available. FINDINGS: Bony ridging along the lateral and dorsal distal radial metaphysis, possibly spurring or deformity from an old injury, difficult to exclude a buckle type fracture. Mild soft tissue swelling overlying the  distal ulna, questionable nondisplaced transverse fracture of the ulnar styloid on the oblique projection. Advanced osteoarthritis at the first carpometacarpal articulation. Faint calcifications noted along the intrinsic interosseous ligaments but not along the TFCC disc. IMPRESSION: 1. Bony ridging along the lateral and dorsal distal radial metaphysis, possibly spurring or deformity from an old injury, difficult to exclude a buckle type fracture. 2. Questionable nondisplaced transverse fracture of the ulnar styloid. 3. Advanced osteoarthritis at the first carpometacarpal articulation. Electronically Signed   By: Ryan Salvage M.D.   On: 09/12/2023 12:34   DG Knee Complete 4 Views  Right Result Date: 09/12/2023 CLINICAL DATA:  Fall, knee pain EXAM: RIGHT KNEE - COMPLETE 4+ VIEW COMPARISON:  None Available. FINDINGS: Spiral fracture of the distal fibular metadiaphysis. Mild distal comminution. 2.0 cm posteromedial displacement of the distal fragment. Prominent osteoarthritis in the knee.  Meniscal chondrocalcinosis. IMPRESSION: 1. Spiral fracture of the distal fibular metadiaphysis with mild distal comminution. 2. Prominent osteoarthritis in the knee. 3. Meniscal chondrocalcinosis. Electronically Signed   By: Ryan Salvage M.D.   On: 09/12/2023 12:32   DG Pelvis 1-2 Views Result Date: 09/12/2023 CLINICAL DATA:  Fall, right hip pain EXAM: PELVIS - 1-2 VIEW COMPARISON:  CT pelvis 11/03/2022 FINDINGS: Bony demineralization. Postoperative findings in the lower lumbar spine. Both hips are somewhat externally rotated. No definite fracture identified. IMPRESSION: 1. No definite fracture identified. 2. Bony demineralization. 3. Postoperative findings in the lower lumbar spine. Electronically Signed   By: Ryan Salvage M.D.   On: 09/12/2023 12:30      Labs: BNP (last 3 results) Recent Labs    06/20/23 1140  BNP 120.4*   Basic Metabolic Panel: Recent Labs  Lab 09/12/23 1114 09/13/23 0805 09/13/23 1616  NA 140 138  --   K 3.5 3.8  --   CL 107 108  --   CO2 18* 18*  --   GLUCOSE 169* 180*  --   BUN 15 10  --   CREATININE 0.69 0.68 0.68  CALCIUM  9.5 9.0  --    Liver Function Tests: No results for input(s): AST, ALT, ALKPHOS, BILITOT, PROT, ALBUMIN  in the last 168 hours. No results for input(s): LIPASE, AMYLASE in the last 168 hours. No results for input(s): AMMONIA in the last 168 hours. CBC: Recent Labs  Lab 09/12/23 1114 09/13/23 0805 09/13/23 1616 09/14/23 0521 09/15/23 0827 09/16/23 0633 09/17/23 0700  WBC 12.0*   < > 14.8* 13.6* 10.9* 11.1* 10.6*  NEUTROABS 9.4*  --   --   --   --   --   --   HGB 13.0   < > 11.2* 10.1* 9.1* 9.7*  9.3*  HCT 40.9   < > 35.0* 31.1* 28.7* 31.3* 28.1*  MCV 94.7   < > 94.1 92.3 93.8 95.4 92.1  PLT 221   < > 171 179 210 208 252   < > = values in this interval not displayed.   Cardiac Enzymes: No results for input(s): CKTOTAL, CKMB, CKMBINDEX, TROPONINI in the last 168 hours. BNP: Invalid input(s): POCBNP CBG: Recent Labs  Lab 09/16/23 1201 09/16/23 1647 09/16/23 2225 09/17/23 0902 09/17/23 1153  GLUCAP 168* 140* 166* 275* 208*   D-Dimer No results for input(s): DDIMER in the last 72 hours. Hgb A1c No results for input(s): HGBA1C in the last 72 hours. Lipid Profile No results for input(s): CHOL, HDL, LDLCALC, TRIG, CHOLHDL, LDLDIRECT in the last 72 hours. Thyroid  function studies No results for input(s): TSH, T4TOTAL, T3FREE, THYROIDAB  in the last 72 hours.  Invalid input(s): FREET3 Anemia work up No results for input(s): VITAMINB12, FOLATE, FERRITIN, TIBC, IRON, RETICCTPCT in the last 72 hours. Urinalysis    Component Value Date/Time   COLORURINE YELLOW 07/23/2023 1236   APPEARANCEUR CLEAR 07/23/2023 1236   LABSPEC 1.010 07/23/2023 1236   PHURINE 6.0 07/23/2023 1236   GLUCOSEU >=500 (A) 07/23/2023 1236   HGBUR SMALL (A) 07/23/2023 1236   BILIRUBINUR NEGATIVE 07/23/2023 1236   KETONESUR NEGATIVE 07/23/2023 1236   PROTEINUR NEGATIVE 07/23/2023 1236   NITRITE NEGATIVE 07/23/2023 1236   LEUKOCYTESUR TRACE (A) 07/23/2023 1236   Sepsis Labs Recent Labs  Lab 09/14/23 0521 09/15/23 0827 09/16/23 0633 09/17/23 0700  WBC 13.6* 10.9* 11.1* 10.6*   Microbiology Recent Results (from the past 240 hours)  Surgical PCR screen     Status: None   Collection Time: 09/12/23 10:29 PM   Specimen: Nasal Mucosa; Nasal Swab  Result Value Ref Range Status   MRSA, PCR NEGATIVE NEGATIVE Final   Staphylococcus aureus NEGATIVE NEGATIVE Final    Comment: (NOTE) The Xpert SA Assay (FDA approved for NASAL specimens in patients 22 years  of age and older), is one component of a comprehensive surveillance program. It is not intended to diagnose infection nor to guide or monitor treatment. Performed at [facility], [address] [place], [place] [address]      Total time spend on discharging this patient, including the last patient exam, discussing the hospital stay, instructions for ongoing care as it relates to all pertinent caregivers, as well as preparing the medical discharge records, prescriptions, and/or referrals as applicable, is 40 minutes.    Ellouise Haber, MD  [facility] 09/17/2023, 3:38 PM

## 2023-09-17 NOTE — TOC Transition Note (Signed)
 Transition of Care [facility]) - Discharge Note   Patient Details  Name: Rhonda Watkins MRN: [id number] Date of Birth: November 23, 1931  Transition of [facility] CM/SW Contact:  Harlene Sharps, LCSW Phone Number: 09/17/2023, 3:37 PM   Clinical Narrative:    Pt to be transported to [facility] via [place]. Nurse to call report to [phone]. RM 302.   Final next level of care: Skilled Nursing Facility Barriers to Discharge: Barriers Resolved   Patient Goals and CMS Choice Patient states their goals for this hospitalization and ongoing recovery are:: Pt too tired to participate in goal setting. CMS Medicare.gov Compare Post Acute Care list provided to:: Patient Represenative (must comment) Choice offered to / list presented to : Adult Children      Discharge Placement              [name] Patient to be transferred to facility by: [facility] Name of family member notified: Scott Patient and family notified of of transfer: 09/17/23  Discharge Plan and Services Additional resources added to the After Visit Summary for   In-house Referral: Clinical Social Work   Post Acute Care Choice: Skilled Nursing Facility                               Social Drivers of Health (SDOH) Interventions SDOH Screenings   Food Insecurity: Patient Unable To Answer (09/13/2023)  Housing: Patient Unable To Answer (09/13/2023)  Transportation Needs: Patient Unable To Answer (09/13/2023)  Utilities: Patient Unable To Answer (09/13/2023)  Depression (PHQ2-9): Low Risk  (03/19/2020)  Social Connections: Patient Unable To Answer (09/13/2023)  Tobacco Use: Medium Risk (09/13/2023)     Readmission Risk Interventions     No data to display

## 2023-09-17 NOTE — Progress Notes (Signed)
 Occupational Therapy Treatment Patient Details Name: Rhonda Watkins MRN: [id number] DOB: 03-31-1932 Today's Date: 09/17/2023   History of present illness Pt is an 89 y/o female who presents 09/12/2023 s/p fall. Pt sustained a R distal femur fracture and L wrist injury. She is now s/p ORIF femur on 09/13/2023 (25% PWB and K.I. at all times). PMH significant for HTN, spinal stenosis with chronic back pain, asthma.   OT comments  Patient received in supine and agreeable to OT treatment and OOB to chair. Patient required total assist +2 to perform rolling in bed for lift pad placement and tolerated transfer to recliner with maximove lift and support with keeping RLE elevated to prevent further pain. Once in recliner patient with decreased pain and able to perform grooming tasks seated. Patient will benefit from continued inpatient follow up therapy, <3 hours/day. Acute OT to continue to follow to address established goals to facilitate DC to next venue of care.       If plan is discharge home, recommend the following:  Two people to help with walking and/or transfers;Two people to help with bathing/dressing/bathroom   Equipment Recommendations  Wheelchair (measurements OT);Wheelchair cushion (measurements OT);Hospital bed;Hoyer lift    Recommendations for Other Services      Precautions / Restrictions Precautions Precautions: Back;Fall Precaution Booklet Issued: No Required Braces or Orthoses: Knee Immobilizer - Right;Splint/Cast Knee Immobilizer - Right: On at all times Splint/Cast: wrist cock up L wrist UE Splint/Cast - Date Prophylactic Dressing Applied (if applicable): 09/13/23 Restrictions Weight Bearing Restrictions Per Provider Order: Yes LUE Weight Bearing Per Provider Order: Weight bearing as tolerated RLE Weight Bearing Per Provider Order: Partial weight bearing RLE Partial Weight Bearing Percentage or Pounds: 25% Other Position/Activity Restrictions: LUE ice and elevate per ortho  notes       Mobility Bed Mobility Overal bed mobility: Needs Assistance Bed Mobility: Rolling Rolling: Total assist, +2 for physical assistance, +2 for safety/equipment, Used rails         General bed mobility comments: rolling performed for lift pad placement    Transfers Overall transfer level: Needs assistance Equipment used: Ambulation equipment used Transfers: Bed to chair/wheelchair/BSC             General transfer comment: Patient tolerated transfer to recliner with maximove and someone assisting with RLE to keep elevated during transfer Transfer via Lift Equipment: Maximove   Balance Overall balance assessment: Needs assistance     Sitting balance - Comments: seated up in recliner Postural control: Left lateral lean     Standing balance comment: not attempted                           ADL either performed or assessed with clinical judgement   ADL Overall ADL's : Needs assistance/impaired     Grooming: Wash/dry hands;Wash/dry face;Oral care;Set up;Sitting Grooming Details (indicate cue type and reason): in recliner             Lower Body Dressing: Total assistance Lower Body Dressing Details (indicate cue type and reason): to donn socks                    Extremity/Trunk Assessment              Vision       Perception     Praxis      Cognition Arousal: Alert Behavior During Therapy: Flat affect Overall Cognitive Status: Impaired/Different from baseline Area of Impairment: Orientation,  Memory, Awareness                 Orientation Level: Disoriented to, Time   Memory: Decreased short-term memory     Awareness: Intellectual   General Comments: Patient agreeable to OOB, stating I having been up for awhile        Exercises      Shoulder Instructions       General Comments      Pertinent Vitals/ Pain       Pain Assessment Pain Assessment: Faces Faces Pain Scale: Hurts even more Pain  Location: RLE during bed mobility and transfer Pain Descriptors / Indicators: Discomfort, Grimacing, Guarding, Moaning Pain Intervention(s): Limited activity within patient's tolerance, Monitored during session, Premedicated before session, Repositioned  Home Living                                          Prior Functioning/Environment              Frequency  Min 1X/week        Progress Toward Goals  OT Goals(current goals can now be found in the care plan section)  Progress towards OT goals: Progressing toward goals  Acute Rehab OT Goals Patient Stated Goal: to have less pain OT Goal Formulation: With patient Time For Goal Achievement: 09/28/23 Potential to Achieve Goals: Good ADL Goals Pt Will Perform Eating: with set-up;sitting Pt Will Perform Grooming: with set-up;sitting Pt Will Perform Upper Body Bathing: with min assist;sitting Pt Will Transfer to Toilet: with +2 assist;with mod assist;stand pivot transfer;bedside commode Additional ADL Goal #1: pt will complete bed mobility total +2 mod (A) as precursor to adls.  Plan      Co-evaluation                 AM-PAC OT 6 Clicks Daily Activity     Outcome Measure   Help from another person eating meals?: A Lot Help from another person taking care of personal grooming?: A Lot Help from another person toileting, which includes using toliet, bedpan, or urinal?: Total Help from another person bathing (including washing, rinsing, drying)?: Total Help from another person to put on and taking off regular upper body clothing?: A Lot Help from another person to put on and taking off regular lower body clothing?: Total 6 Click Score: 9    End of Session Equipment Utilized During Treatment: Other (comment) (maximove)  OT Visit Diagnosis: Unsteadiness on feet (R26.81);Muscle weakness (generalized) (M62.81);Pain Pain - Right/Left: Right Pain - part of body: Leg   Activity Tolerance Patient  limited by pain   Patient Left in chair;with call bell/phone within reach;with chair alarm set   Nurse Communication Mobility status;Precautions;Need for lift equipment        Time: [phone] OT Time Calculation (min): 34 min  Charges: OT General Charges $OT Visit: 1 Visit OT Treatments $Self Care/Home Management : 8-22 mins $Therapeutic Activity: 8-22 mins  Dick Laine, OTA Acute Rehabilitation Services  Office [phone]   Jeb LITTIE Laine 09/17/2023, 12:37 PM

## 2023-09-17 NOTE — Progress Notes (Signed)
 Physical Therapy Treatment Patient Details Name: Rhonda Watkins MRN: [id number] DOB: 07-Jul-1932 Today's Date: 09/17/2023   History of Present Illness Pt is an 89 y/o female who presents 09/12/2023 s/p fall. Pt sustained a R distal femur fracture and L wrist injury. She is now s/p ORIF femur on 09/13/2023 (25% PWB and K.I. at all times). 2/5 lumbar and thoracic spine imaging negative for fx. PMH significant for HTN, spinal stenosis with chronic back pain, asthma.    PT Comments  Pt received in recliner, pt agreeable to therapy session with emphasis on transfer safety from chair to bed (student RN present to assist and PTA instructing her on hoyer lift safety/mobilizing pt in lift), pressure relief strategies, ankle pumps/supine exercise and use of IS. Pt pulling ~1000 mL on IS and pt able to achieve supine to long sitting with +1 assist today but limited tolerance to long sitting after lift back to bed due to increased BLE pain. Pt also c/o LLE pain this date, ice offered but pt defers. Pt encouraged to eat more of her meal and she agreed to try. Pt will continue to benefit from skilled rehab in a post acute setting < 3 hours per day to maximize functional gains before returning home.    If plan is discharge home, recommend the following: Two people to help with walking and/or transfers;Two people to help with bathing/dressing/bathroom;Direct supervision/assist for medications management;Direct supervision/assist for financial management;Assist for transportation;Help with stairs or ramp for entrance;Supervision due to cognitive status   Can travel by private vehicle     No  Equipment Recommendations  None recommended by PT (currently hoyer level)    Recommendations for Other Services       Precautions / Restrictions Precautions Precautions: Back;Fall Precaution Booklet Issued: No Precaution Comments: back precs for comfort, negative imaging for fx 2/5 Required Braces or Orthoses: Knee  Immobilizer - Right;Splint/Cast Knee Immobilizer - Right: On at all times Splint/Cast: wrist cock up L wrist UE Splint/Cast - Date Prophylactic Dressing Applied (if applicable): 09/13/23 Restrictions Weight Bearing Restrictions Per Provider Order: Yes LUE Weight Bearing Per Provider Order: Weight bearing as tolerated RLE Weight Bearing Per Provider Order: Partial weight bearing RLE Partial Weight Bearing Percentage or Pounds: 25% Other Position/Activity Restrictions: LUE ice and elevate per ortho notes     Mobility  Bed Mobility Overal bed mobility: Needs Assistance Bed Mobility: Rolling, Supine to Sit Rolling: +2 for physical assistance, +2 for safety/equipment, Used rails, Max assist, Total assist   Supine to sit: Min assist, Used rails     General bed mobility comments: rolling performed for lift pad placement, pt minA for supine to long sitting briefly but did not maintain upright more than a few seconds    Transfers Overall transfer level: Needs assistance   Transfers: Bed to chair/wheelchair/BSC             General transfer comment: Patient tolerated transfer from recliner to bed with maximove and someone assisting with RLE to keep elevated during transfer, anxious due to pain but more agreeable today to movement than previous session Transfer via Lift Equipment: Maximove  Ambulation/Gait                   Stairs             Wheelchair Mobility     Tilt Bed    Modified Rankin (Stroke Patients Only)       Balance Overall balance assessment: Needs assistance Sitting-balance support: Bilateral upper extremity  supported, Feet unsupported Sitting balance-Leahy Scale: Poor Sitting balance - Comments: long sitting with minA but limited due to c/o pain/fatigue       Standing balance comment: not attempted                            Cognition Arousal: Alert Behavior During Therapy: Anxious Overall Cognitive Status:  Impaired/Different from baseline Area of Impairment: Orientation, Memory, Awareness                     Memory: Decreased short-term memory, Decreased recall of precautions     Awareness: Intellectual   General Comments: Patient agreeable to return to supine, pt does not recall having back imaging previous evening and needs reminders to recall her precs        Exercises Other Exercises Other Exercises: Pt instructed on use of IS and performs x10 ~1,091mL ea rep, encouraged hourly use (handout in room to reinforce), ankle pumps x10 Other Exercises: reclined posture to long sitting x3 reps (bed crunches)    General Comments General comments (skin integrity, edema, etc.): pt defers ice pack when offered; KI in place throughout      Pertinent Vitals/Pain Pain Assessment Pain Assessment: Faces Faces Pain Scale: Hurts little more Pain Location: RLE during bed mobility and transfer, pt c/o some LLE tenderness to ROM/palpation distal to her knee which she did not complain of on previous date. Pain Descriptors / Indicators: Discomfort, Grimacing, Guarding Pain Intervention(s): Limited activity within patient's tolerance, Monitored during session, Premedicated before session, Repositioned    Home Living                          Prior Function            PT Goals (current goals can now be found in the care plan section) Acute Rehab PT Goals Patient Stated Goal: less pain PT Goal Formulation: Patient unable to participate in goal setting Time For Goal Achievement: 09/28/23 Progress towards PT goals: Progressing toward goals    Frequency    Min 1X/week      PT Plan      Co-evaluation              AM-PAC PT 6 Clicks Mobility   Outcome Measure  Help needed turning from your back to your side while in a flat bed without using bedrails?: Total (+2 assist for all aspects of care today) Help needed moving from lying on your back to sitting on the  side of a flat bed without using bedrails?: Total Help needed moving to and from a bed to a chair (including a wheelchair)?: Total Help needed standing up from a chair using your arms (e.g., wheelchair or bedside chair)?: Total Help needed to walk in hospital room?: Total Help needed climbing 3-5 steps with a railing? : Total 6 Click Score: 6    End of Session   Activity Tolerance: Patient tolerated treatment well Patient left: in bed;with call bell/phone within reach;with bed alarm set;Other (comment) (HOB elevated and pt encouraged to eat more dinner) Nurse Communication: Mobility status;Need for lift equipment PT Visit Diagnosis: Pain;History of falling (Z91.81) Pain - Right/Left: Left Pain - part of body: Hip;Leg;Knee (LLE distal to her L knee and RLE full leg)     Time: 8240-8178 PT Time Calculation (min) (ACUTE ONLY): 22 min  Charges:    $Therapeutic Activity: 8-22 mins PT General  Charges $$ ACUTE PT VISIT: 1 Visit                     Mishka Stegemann P., PTA Acute Rehabilitation Services Secure Chat Preferred 9a-5:30pm Office: [phone]    Connell HERO [facility] 09/17/2023, 6:33 PM

## 2023-09-18 NOTE — Progress Notes (Signed)
 Pt transported to [facility] by [facility]. Pt's son called to inform him that Pt. Was transferred this evening.Pt son very happy and thankful for the care given to his mother.

## 2023-09-30 ENCOUNTER — Ambulatory Visit: Payer: Medicare Other | Admitting: Podiatry

## 2023-10-05 ENCOUNTER — Encounter: Payer: Self-pay | Admitting: Podiatry

## 2023-10-05 ENCOUNTER — Ambulatory Visit (INDEPENDENT_AMBULATORY_CARE_PROVIDER_SITE_OTHER): Payer: Medicare Other | Admitting: Podiatry

## 2023-10-05 DIAGNOSIS — L989 Disorder of the skin and subcutaneous tissue, unspecified: Secondary | ICD-10-CM

## 2023-10-05 DIAGNOSIS — M79675 Pain in left toe(s): Secondary | ICD-10-CM

## 2023-10-05 DIAGNOSIS — B351 Tinea unguium: Secondary | ICD-10-CM

## 2023-10-05 DIAGNOSIS — M79674 Pain in right toe(s): Secondary | ICD-10-CM | POA: Diagnosis not present

## 2023-10-05 NOTE — Progress Notes (Signed)
   Chief Complaint  Patient presents with   Diabetes    "I think I'm here about the fungus nails."  (Patient not sure why she's here, does have a corn on 5th digit left)    Subjective: 89 y.o. female nonambulatory in a wheelchair presenting to the office today for evaluation of a symptomatic callus to the left fifth toe.  Also requesting evaluation of her toenails.  Presenting for routine footcare   Past Medical History:  Diagnosis Date   Arthritis    Asthma    Diabetes mellitus without complication (HCC)    GERD (gastroesophageal reflux disease)    Hyperlipidemia    Hypertension    Mitral regurgitation    Shortness of breath dyspnea    occ   UTI (lower urinary tract infection)    Vitamin D deficiency     Past Surgical History:  Procedure Laterality Date   BACK SURGERY  85,04   BLADDER REPAIR  95,05   BUNIONECTOMY Left 15   cholecyctectomy     CHOLECYSTECTOMY  80   COLON SURGERY  05   EXPLORATION MIDDLE EAR Left    vertigo- no hearing now- 89 yrs old   HERNIA REPAIR  [phone]   KNEE ARTHROSCOPY     lft 02 rt 09   LAPAROTOMY N/A 11/06/2022   Procedure: EXPLORATORY LAPAROTOMY;  Surgeon: Berna Bue, MD;  Location: [facility];  Service: General;  Laterality: N/A;   ORIF FEMUR FRACTURE Right 09/13/2023   Procedure: OPEN REDUCTION INTERNAL FIXATION (ORIF) DISTAL FEMUR FRACTURE;  Surgeon: Sheral Apley, MD;  Location: [facility] OR;  Service: Orthopedics;  Laterality: Right;    Allergies  Allergen Reactions   Jardiance [Empagliflozin] Other (See Comments)    Fatigue    Metformin And Related Other (See Comments)    Fatigue    Tradjenta [Linagliptin] Other (See Comments)    Unknown reaction     Objective:  Physical Exam General: Alert and oriented x3 in no acute distress  Dermatology: Hyperkeratotic lesion(s) present on the lateral aspect of the left fifth toe. Pain on palpation with a central nucleated core noted. Skin is warm, dry and supple bilateral lower  extremities. Negative for open lesions or macerations.  Vascular: Palpable pedal pulses bilaterally. No edema or erythema noted. Capillary refill within normal limits.  Neurological: Grossly intact via light touch  Musculoskeletal Exam: Nonambulatory in a wheelchair  Assessment: 1.  Symptomatic corn fifth digit left foot 2.  Pain due to onychomycosis of toenails both   Plan of Care:  -Patient evaluated -Excisional debridement of keratoic lesion(s) using a chisel blade was performed without incident.  - Mechanical debridement of nails 1-5 bilateral was performed using a nail nipper without incident or bleeding -Return to the clinic PRN.   Felecia Shelling, DPM [facility]  Dr. Felecia Shelling, DPM    [address] [place], [place] [address]                Office [phone]  Fax [phone]

## 2023-11-25 ENCOUNTER — Other Ambulatory Visit: Payer: Self-pay

## 2023-11-25 ENCOUNTER — Ambulatory Visit (INDEPENDENT_AMBULATORY_CARE_PROVIDER_SITE_OTHER): Payer: Medicare Other | Admitting: Internal Medicine

## 2023-11-25 VITALS — BP 130/70 | HR 81 | Temp 98.2°F | Resp 16

## 2023-11-25 DIAGNOSIS — J310 Chronic rhinitis: Secondary | ICD-10-CM | POA: Diagnosis not present

## 2023-11-25 DIAGNOSIS — K219 Gastro-esophageal reflux disease without esophagitis: Secondary | ICD-10-CM

## 2023-11-25 DIAGNOSIS — J4530 Mild persistent asthma, uncomplicated: Secondary | ICD-10-CM | POA: Diagnosis not present

## 2023-11-25 MED ORDER — ALBUTEROL SULFATE HFA 108 (90 BASE) MCG/ACT IN AERS
1.0000 | INHALATION_SPRAY | Freq: Four times a day (QID) | RESPIRATORY_TRACT | 1 refills | Status: DC | PRN
Start: 2023-11-25 — End: 2024-01-06

## 2023-11-25 MED ORDER — OMEPRAZOLE 20 MG PO CPDR: 20.0000 mg | DELAYED_RELEASE_CAPSULE | Freq: Every day | ORAL | 5 refills | Status: DC

## 2023-11-25 MED ORDER — FAMOTIDINE 20 MG PO TABS: 20.0000 mg | ORAL_TABLET | Freq: Every day | ORAL | 5 refills | Status: DC | PRN

## 2023-11-25 MED ORDER — FLUTICASONE PROPIONATE 50 MCG/ACT NA SUSP: 2.0000 | Freq: Every day | NASAL | 5 refills | Status: DC

## 2023-11-25 MED ORDER — ASMANEX (120 METERED DOSES) 220 MCG/ACT IN AEPB: 1.0000 | INHALATION_SPRAY | Freq: Every day | RESPIRATORY_TRACT | 5 refills | Status: DC

## 2023-11-25 NOTE — Progress Notes (Signed)
 FOLLOW UP Date of Service/Encounter:  11/25/23   Subjective:  Rhonda Watkins (DOB: 1932-08-05) is a 89 y.o. female [facility] for follow up for asthma, LPR and chronic rhinitis.   History obtained from: chart review and patient. Last seen on 05/27/2023 and at the time asthma was controlled on Asmanex, LPR was uncontrolled so discussed adding Pepcid PRN.     Since last visit, reports moving to a new senior living place to [place].  Also had a mechanical fall requiring ORIF for femur fracture in early February.  Noted breathing  is doing fine, not much SOB/wheezing.  Has noted some cough on and off the past few weeks.  Also notes some post nasal drainage, not much congestion/sneezing.  Not using Flonase.  Has been on and off Asmanex inhaler with everything going on.  Not much need for rescue inhaler. No ER or urgent care visits or oral prednisone for asthma flare ups since last visit. Not much trouble with reflux or heartburn.  Taking Prilosec and some days Pepcid.    Past Medical History: Past Medical History:  Diagnosis Date   Arthritis    Asthma    Diabetes mellitus without complication (HCC)    GERD (gastroesophageal reflux disease)    Hyperlipidemia    Hypertension    Mitral regurgitation    Shortness of breath dyspnea    occ   UTI (lower urinary tract infection)    Vitamin D deficiency     Objective:  BP 130/70   Pulse 81   Temp 98.2 F (36.8 C)   Resp 16   SpO2 100%  There is no height or weight on file to calculate BMI. Physical Exam: GEN: alert, well developed HEENT: clear conjunctiva, nose without inferior turbinate hypertrophy, pink nasal mucosa, no rhinorrhea, + cobblestoning HEART: regular rate and rhythm, no murmur LUNGS: clear to auscultation bilaterally, no coughing, unlabored respiration SKIN: no rashes or lesions  Spirometry:  Tracings reviewed. Her effort: Good reproducible efforts. FVC: 2.52L, 117%  predicted  FEV1: 1.85L, 115% predicted FEV1/FVC ratio: 73% Interpretation: Spirometry consistent with normal pattern.  Please see scanned spirometry results for details.   Assessment:   1. Chronic rhinitis   2. LPRD (laryngopharyngeal reflux disease)   3. Mild persistent asthma without complication     Plan/Recommendations:  Mild persistent asthma - Well controlled, normal spirometry, discussed cough is not due to uncontrolled asthma.  - Daily controller medication(s): Asmanex Twisthaler 1 puffs once daily - Rescue medications: Albuterol 1-2 puffs every 4-6 hours if needed for wheezing/shortness of breath - Changes during respiratory infections or worsening symptoms: Increase Asmanex to 2 puffs twice daily for TWO WEEKS. - Asthma control goals:  * Full participation in all desired activities (may need albuterol before activity) * Albuterol use two time or less a week on average (not counting use with activity) * Cough interfering with sleep two time or less a month * Oral steroids no more than once a year * No hospitalizations  LPRD (laryngopharyngeal reflux disease) - Controlled  - Continue with Prilosec 20mg  daily. - Continue with Famotidine 20mg  nightly as needed.   Chronic Rhinitis  - Cough could be due to post nasal drip, restart Flonase.  - Use nasal saline rinses before nose sprays such as with Neilmed Sinus Rinse.  Use distilled water.   - Use Flonase 2 sprays each nostril daily as needed. Aim upward and outward. - Use warm tea and  honey for cough.     Return in about 6 months (around 05/26/2024).  Kristen Petri, MD [facility]  

## 2023-11-25 NOTE — Patient Instructions (Addendum)
 Mild persistent asthma - Daily controller medication(s): Asmanex Twisthaler 220mcg 1 puffs once daily - Rescue medications: Albuterol 1-2 puffs every 4-6 hours if needed for wheezing/shortness of breath - Changes during respiratory infections or worsening symptoms: Increase Asmanex to 2 puffs twice daily for TWO WEEKS. - Asthma control goals:  * Full participation in all desired activities (may need albuterol before activity) * Albuterol use two time or less a week on average (not counting use with activity) * Cough interfering with sleep two time or less a month * Oral steroids no more than once a year * No hospitalizations  LPRD (laryngopharyngeal reflux disease) - Continue with Prilosec 20mg  daily. - Continue with Famotidine 20mg  nightly as needed.   Chronic Rhinitis  - Use nasal saline rinses before nose sprays such as with Neilmed Sinus Rinse.  Use distilled water.   - Use Flonase 2 sprays each nostril daily as needed. Aim upward and outward. - Use warm tea and honey for cough.

## 2023-12-01 ENCOUNTER — Telehealth: Payer: Self-pay | Admitting: Internal Medicine

## 2023-12-01 NOTE — Telephone Encounter (Signed)
 Patient called stating she is needing the instruction on her Asmanex  inhaler to say 1 puff into lungs and increase as needed. Patient would like a call back at [phone].

## 2023-12-04 ENCOUNTER — Telehealth: Payer: Self-pay

## 2023-12-04 NOTE — Telephone Encounter (Signed)
 Spoke with the patient. Went over at home instructions again from last OV. Verbalized understanding.

## 2023-12-30 ENCOUNTER — Telehealth: Payer: Self-pay | Admitting: Allergy

## 2023-12-30 NOTE — Telephone Encounter (Signed)
 Rhonda Watkins is a patient of Dr. Lydia Sams, so routing this to Dr. Tempie Fee as she is the on call. Rhonda Watkins called and stated that she lives in a retirement home, and that she is not allowed to have the medications we prescribed for her unless there are written orders from our office. Her contact number is [phone]. The retirement home is [facility], the fax number is [phone] ATTN: Lovett Ruck

## 2024-01-06 ENCOUNTER — Other Ambulatory Visit: Payer: Self-pay

## 2024-01-06 MED ORDER — FAMOTIDINE 20 MG PO TABS: 20.0000 mg | ORAL_TABLET | Freq: Every day | ORAL | 5 refills | Status: DC | PRN

## 2024-01-06 MED ORDER — FLUTICASONE PROPIONATE 50 MCG/ACT NA SUSP
NASAL | 5 refills | Status: DC
Start: 2024-01-06 — End: 2024-05-25

## 2024-01-06 MED ORDER — OMEPRAZOLE 20 MG PO CPDR
20.0000 mg | DELAYED_RELEASE_CAPSULE | Freq: Every day | ORAL | 5 refills | Status: DC
Start: 2024-01-06 — End: 2024-05-25

## 2024-01-06 MED ORDER — ALBUTEROL SULFATE HFA 108 (90 BASE) MCG/ACT IN AERS: INHALATION_SPRAY | RESPIRATORY_TRACT | 1 refills | Status: DC

## 2024-01-06 MED ORDER — ASMANEX (120 METERED DOSES) 220 MCG/ACT IN AEPB: INHALATION_SPRAY | RESPIRATORY_TRACT | 5 refills | Status: DC

## 2024-01-06 NOTE — Telephone Encounter (Signed)
 Talked with Rhonda Watkins at [facility] and she said they need the prescriptions faxed to the facility.  Will have Dr. Tempie Fee sign off on printed prescriptions and have them faxed to facility.

## 2024-05-25 ENCOUNTER — Encounter: Payer: Self-pay | Admitting: Internal Medicine

## 2024-05-25 ENCOUNTER — Other Ambulatory Visit: Payer: Self-pay

## 2024-05-25 ENCOUNTER — Ambulatory Visit: Admitting: Internal Medicine

## 2024-05-25 VITALS — BP 130/76 | HR 76 | Temp 98.2°F

## 2024-05-25 DIAGNOSIS — J4530 Mild persistent asthma, uncomplicated: Secondary | ICD-10-CM

## 2024-05-25 DIAGNOSIS — K219 Gastro-esophageal reflux disease without esophagitis: Secondary | ICD-10-CM | POA: Diagnosis not present

## 2024-05-25 DIAGNOSIS — J310 Chronic rhinitis: Secondary | ICD-10-CM

## 2024-05-25 MED ORDER — ALBUTEROL SULFATE HFA 108 (90 BASE) MCG/ACT IN AERS: 1.0000 | INHALATION_SPRAY | Freq: Four times a day (QID) | RESPIRATORY_TRACT | 1 refills | Status: AC | PRN

## 2024-05-25 MED ORDER — FLUTICASONE PROPIONATE 50 MCG/ACT NA SUSP: 2.0000 | Freq: Every day | NASAL | 5 refills | Status: AC | PRN

## 2024-05-25 MED ORDER — OMEPRAZOLE 20 MG PO CPDR: 20.0000 mg | DELAYED_RELEASE_CAPSULE | Freq: Every day | ORAL | 5 refills | Status: AC

## 2024-05-25 MED ORDER — ARNUITY ELLIPTA 100 MCG/ACT IN AEPB: 1.0000 | INHALATION_SPRAY | Freq: Every day | RESPIRATORY_TRACT | 5 refills | Status: AC

## 2024-05-25 MED ORDER — FAMOTIDINE 20 MG PO TABS: 20.0000 mg | ORAL_TABLET | Freq: Every day | ORAL | 5 refills | Status: DC | PRN

## 2024-05-25 NOTE — Progress Notes (Signed)
 FOLLOW UP Date of Service/Encounter:  05/25/24   Subjective:  Rhonda Watkins (DOB: 02/02/1932) is a 89 y.o. female who [facility] for follow up for asthma, LPRD, chronic rhinitis.   History obtained from: chart review and patient. Last visit was with me on 11/25/2023 and at the time, we discussed cough is not from asthma, continue Asmanex  1 puff daily.  Could be from post nasal drainage, start Flonase .  Also on Prilosec /Pepcid  PRN for LPRD.   Reports asthma is doing fine. Not much trouble with wheezing/shortness of breath.  Still with a slight tickle and cough and a little post nasal drainage.  Using Arnuity daily, not on Asmanex  as it is not covered by insurance.  Rarely needs Albuterol .  No ER/urgent care/oral prednisone since last visit. Not using Flonase  much.  Taking Prilosec  daily and Pepcid  PRN, denies trouble with heartburn/reflux.  She is in a nursing home and notes a lot has happened since last visit with passing of his grandson, injury to her tooth and leg that has since caused limitation of her mobility.  Has 2 sons in [place] and 1 son in [place] and daughters in [place].  They all sometimes come to visit.   Past Medical History: Past Medical History:  Diagnosis Date   Arthritis    Asthma    Diabetes mellitus without complication (HCC)    GERD (gastroesophageal reflux disease)    Hyperlipidemia    Hypertension    Mitral regurgitation    Shortness of breath dyspnea    occ   UTI (lower urinary tract infection)    Vitamin D  deficiency     Objective:  BP 130/76 (BP Location: Right Arm, Patient Position: Sitting, Cuff Size: Normal)   Pulse 76   Temp 98.2 F (36.8 C) (Temporal)   SpO2 100%  There is no height or weight on file to calculate BMI. Physical Exam: GEN: alert, well developed HEENT: clear conjunctiva, nose with mild inferior turbinate hypertrophy, pink nasal mucosa, + slight clear rhinorrhea, +  cobblestoning HEART: regular rate and rhythm, no murmur LUNGS: clear to auscultation bilaterally, no coughing, unlabored respiration SKIN: no rashes or lesions  Spirometry:  Tracings reviewed. Her effort: Good reproducible efforts. FVC: 2.82L, 132%% predicted  FEV1: 1.72L, 108%% predicted FEV1/FVC ratio: 61% Interpretation: Spirometry consistent with normal pattern.  Please see scanned spirometry results for details.  Assessment:   1. Chronic rhinitis   2. LPRD (laryngopharyngeal reflux disease)   3. Mild persistent asthma without complication     Plan/Recommendations:  Mild persistent asthma - Controlled with normal spirometry today.  - Daily controller medication(s): Arnuity 100mcg 1 puff daily  - Rescue medications: Albuterol  1-2 puffs every 4-6 hours if needed for wheezing/shortness of breath - Asthma control goals:  * Full participation in all desired activities (may need albuterol  before activity) * Albuterol  use two time or less a week on average (not counting use with activity) * Cough interfering with sleep two time or less a month * Oral steroids no more than once a year * No hospitalizations  LPRD (laryngopharyngeal reflux disease) - Controlled  - Continue with Prilosec  20mg  daily. - Continue with Famotidine  20mg  nightly as needed of heartburn/reflux.   Chronic Rhinitis  - Not well controlled, discussed use of Flonase   - Use nasal saline rinses before nose sprays such as with Neilmed Sinus Rinse.  Use distilled water.   - If you notice trouble with runny nose/congestion/post nasal  drainage, use Flonase  2 sprays each nostril daily as needed. Aim upward and outward. - Use warm tea and honey for cough.     Return in about 6 months (around 11/23/2024).  Arleta Blanch, MD [facility] 

## 2024-05-25 NOTE — Patient Instructions (Addendum)
 Mild persistent asthma - Daily controller medication(s): Arnuity 100mcg 1 puff daily  - Rescue medications: Albuterol  1-2 puffs every 4-6 hours if needed for wheezing/shortness of breath - Asthma control goals:  * Full participation in all desired activities (may need albuterol  before activity) * Albuterol  use two time or less a week on average (not counting use with activity) * Cough interfering with sleep two time or less a month * Oral steroids no more than once a year * No hospitalizations  LPRD (laryngopharyngeal reflux disease) - Continue with Prilosec  20mg  daily. - Continue with Famotidine  20mg  nightly as needed of heartburn/reflux.   Chronic Rhinitis  - Use nasal saline rinses before nose sprays such as with Neilmed Sinus Rinse.  Use distilled water.   - If you notice trouble with runny nose/congestion/post nasal drainage, use Flonase  2 sprays each nostril daily as needed. Aim upward and outward. - Use warm tea and honey for cough.

## 2024-05-25 NOTE — Addendum Note (Signed)
 Addended by: AZALEA, Leiland Mihelich on: 05/25/2024 02:58 PM   Modules accepted: Orders

## 2024-06-23 ENCOUNTER — Other Ambulatory Visit: Payer: Self-pay

## 2024-06-23 ENCOUNTER — Emergency Department (HOSPITAL_BASED_OUTPATIENT_CLINIC_OR_DEPARTMENT_OTHER)
Admission: EM | Admit: 2024-06-23 | Discharge: 2024-06-23 | Disposition: A | Discharge status: Home / Self Care | Attending: Emergency Medicine | Admitting: Emergency Medicine

## 2024-06-23 ENCOUNTER — Encounter (HOSPITAL_BASED_OUTPATIENT_CLINIC_OR_DEPARTMENT_OTHER): Payer: Self-pay

## 2024-06-23 DIAGNOSIS — R200 Anesthesia of skin: Secondary | ICD-10-CM | POA: Diagnosis present

## 2024-06-23 DIAGNOSIS — R531 Weakness: Secondary | ICD-10-CM | POA: Diagnosis not present

## 2024-06-23 DIAGNOSIS — R29898 Other symptoms and signs involving the musculoskeletal system: Secondary | ICD-10-CM

## 2024-06-23 LAB — CBG MONITORING, ED: Glucose-Capillary: 169 mg/dL — ABNORMAL HIGH (ref 70–99)

## 2024-06-23 NOTE — ED Provider Notes (Signed)
 [place] EMERGENCY DEPARTMENT AT [facility] Provider Note   CSN: [id number] Arrival date & time: 06/23/24  1043     Patient presents with: Numbness (Right)   Rhonda Watkins is a 89 y.o. female.   89 yo  F with a chief complaints of right hand numbness.  Tells me this been going on since she had her hip fixed.  She thinks this was a couple months ago.  She does think that she might of hurt her wrist when she fell.  She is also having some trouble using her left thumb.  Drops things at times.  Feels like it is weak.  She has not been able to walk since her hip was repaired  Tells me the numbness is mostly on the first 3 fingers of the hand.  Nothing seems to make it occur.  Described it as tingling initially and now feels like it is more numb..        Prior to Admission medications   Medication Sig Start Date End Date Taking? Authorizing Provider  acetaminophen  (TYLENOL  8 HOUR) 650 MG CR tablet Take 1 tablet (650 mg total) by mouth every 6 (six) hours as needed for pain or fever. 07/06/23   Charlyn Sora, MD  albuterol  (VENTOLIN  HFA) 108 (90 Base) MCG/ACT inhaler Inhale 1-2 puffs into the lungs every 6 (six) hours as needed for wheezing or shortness of breath. 05/25/24   Tobie Arleta SQUIBB, MD  atorvastatin  (LIPITOR) 10 MG tablet Take 10 mg by mouth daily. 12/27/17   [provider]  docusate sodium  (COLACE) 100 MG capsule Take 1 capsule (100 mg total) by mouth 2 (two) times daily. 09/17/23   Awanda City, MD  enoxaparin  (LOVENOX ) 40 MG/0.4ML injection Inject 0.4 mLs (40 mg total) into the skin daily for 14 days. 09/18/23 10/02/23  Awanda City, MD  famotidine  (PEPCID ) 20 MG tablet Take 1 tablet (20 mg total) by mouth daily as needed for heartburn or indigestion. 05/25/24   Tobie Arleta SQUIBB, MD  fluticasone  (FLONASE ) 50 MCG/ACT nasal spray Place 2 sprays into both nostrils daily as needed for allergies or rhinitis. 05/25/24   Tobie Arleta SQUIBB, MD  Fluticasone  Furoate (ARNUITY  ELLIPTA) 100 MCG/ACT AEPB Inhale 1 puff into the lungs daily. 05/25/24   Tobie Arleta SQUIBB, MD  furosemide  (LASIX ) 40 MG tablet Take 40 mg by mouth daily. 08/31/16   [provider]  gabapentin  (NEURONTIN ) 100 MG capsule Take 1 capsule (100 mg total) by mouth 3 (three) times daily for 14 days. 07/19/23 11/14/23  Keith, Kayla N, PA-C  HYDROcodone -acetaminophen  (NORCO/VICODIN) 5-325 MG tablet Take 1 tablet by mouth every 6 (six) hours as needed for moderate pain (pain score 4-6) or severe pain (pain score 7-10). Patient not taking: Reported on 11/25/2023 09/17/23   Awanda City, MD  LANTUS  SOLOSTAR 100 UNIT/ML Solostar Pen Inject 12 Units into the skin at bedtime.    [provider]  methocarbamol  (ROBAXIN ) 500 MG tablet Take 1 tablet (500 mg total) by mouth every 6 (six) hours as needed for muscle spasms. Patient not taking: Reported on 11/25/2023 09/17/23   Awanda City, MD  omeprazole  (PRILOSEC ) 20 MG capsule Take 1 capsule (20 mg total) by mouth daily. 05/25/24   Tobie Arleta SQUIBB, MD  polyethylene glycol (MIRALAX  / GLYCOLAX ) 17 g packet Take 17 g by mouth daily as needed (constipation). 09/17/23   Awanda City, MD    Allergies: Jardiance [empagliflozin], Metformin and related, and Tradjenta [linagliptin]    Review  of Systems  Updated Vital Signs BP 124/61 (BP Location: Right Arm)   Pulse 86   Temp 98.2 F (36.8 C) (Oral)   Resp 16   SpO2 98%   Physical Exam Vitals and nursing note reviewed.  Constitutional:      General: She is not in acute distress.    Appearance: She is well-developed. She is not diaphoretic.  HENT:     Head: Normocephalic and atraumatic.  Eyes:     Pupils: Pupils are equal, round, and reactive to light.  Cardiovascular:     Rate and Rhythm: Normal rate and regular rhythm.     Heart sounds: No murmur heard.    No friction rub. No gallop.  Pulmonary:     Effort: Pulmonary effort is normal.     Breath sounds: No wheezing or rales.  Abdominal:     General: There is  no distension.     Palpations: Abdomen is soft.     Tenderness: There is no abdominal tenderness.  Musculoskeletal:        General: No tenderness.     Cervical back: Normal range of motion and neck supple.     Comments: Pulse motor and sensation are intact to the right hand.  She does have some ligamentous laxity of the ligaments of the base of the left thumb.  Skin:    General: Skin is warm and dry.  Neurological:     Mental Status: She is alert and oriented to person, place, and time.  Psychiatric:        Behavior: Behavior normal.     (all labs ordered are listed, but only abnormal results are displayed) Labs Reviewed  CBG MONITORING, ED - Abnormal; Notable for the following components:      Result Value   Glucose-Capillary 169 (*)    All other components within normal limits    EKG: None  Radiology: No results found.   Procedures   Medications Ordered in the ED - No data to display                                  Medical Decision Making  8 yoF with a chief complaint of right hand numbness.  Mostly to the first 3 digits.  Query carpal tunnel syndrome.  She has been motorized wheelchair bound it looks like for about 9 months.  She tells me she has had symptoms since she had her injury.  She says it is hard to push the joystick to go forward.  She is also complaining of some weakness to the left thumb.  Perhaps the patient has gamekeeper's thumb from her original fall.  I think it is less likely the patient has had a stroke.  I did offer MRI imaging and she is declining.  Will place in a brace for comfort.  Orthopedic follow-up.  11:59 AM:  I have discussed the diagnosis/risks/treatment options with the patient.  Evaluation and diagnostic testing in the emergency department does not suggest an emergent condition requiring admission or immediate intervention beyond what has been performed at this time.  They will follow up with PCP. We also discussed returning to the  ED immediately if new or worsening sx occur. We discussed the sx which are most concerning (e.g., sudden worsening pain, fever, inability to tolerate by mouth) that necessitate immediate return. Medications administered to the patient during their visit and any new prescriptions provided to  the patient are listed below.  Medications given during this visit Medications - No data to display   The patient appears reasonably screen and/or stabilized for discharge and I doubt any other medical condition or other [facility] requiring further screening, evaluation, or treatment in the ED at this time prior to discharge.         Final diagnoses:  Hand numbness  Thumb weakness    ED Discharge Orders          Ordered    Ambulatory referral to Neurology       Comments: Hand numbness?   06/23/24 1147               Emil Share, DO 06/23/24 1159

## 2024-06-23 NOTE — ED Triage Notes (Signed)
No neuro deficits noted.

## 2024-06-23 NOTE — ED Notes (Signed)
 Reviewed AVS/discharge instructions with patient. Time allotted for and all questions answered. Patient is agreeable for d/c and escorted to ED exit by staff.

## 2024-06-23 NOTE — Discharge Instructions (Addendum)
 Please wear the wrist brace for comfort and especially at night while you are sleeping.  Please follow-up with your orthopedic clinic.

## 2024-06-23 NOTE — ED Triage Notes (Signed)
 Patient reports numbness and coldness of her right upper extremity for two months. States she had broke her right femur and it occurred after that.

## 2024-08-15 ENCOUNTER — Other Ambulatory Visit: Payer: Self-pay

## 2024-08-15 ENCOUNTER — Emergency Department (HOSPITAL_COMMUNITY)

## 2024-08-15 ENCOUNTER — Emergency Department (HOSPITAL_COMMUNITY)
Admission: EM | Admit: 2024-08-15 | Discharge: 2024-08-15 | Disposition: A | Discharge status: Home / Self Care | Attending: Emergency Medicine | Admitting: Emergency Medicine

## 2024-08-15 DIAGNOSIS — M25561 Pain in right knee: Secondary | ICD-10-CM | POA: Insufficient documentation

## 2024-08-15 DIAGNOSIS — J45909 Unspecified asthma, uncomplicated: Secondary | ICD-10-CM | POA: Diagnosis not present

## 2024-08-15 DIAGNOSIS — Z87891 Personal history of nicotine dependence: Secondary | ICD-10-CM | POA: Diagnosis not present

## 2024-08-15 DIAGNOSIS — W1830XA Fall on same level, unspecified, initial encounter: Secondary | ICD-10-CM | POA: Diagnosis not present

## 2024-08-15 DIAGNOSIS — E119 Type 2 diabetes mellitus without complications: Secondary | ICD-10-CM | POA: Diagnosis not present

## 2024-08-15 DIAGNOSIS — I10 Essential (primary) hypertension: Secondary | ICD-10-CM | POA: Insufficient documentation

## 2024-08-15 DIAGNOSIS — M545 Low back pain, unspecified: Secondary | ICD-10-CM | POA: Diagnosis not present

## 2024-08-15 MED ORDER — ACETAMINOPHEN 325 MG PO TABS
650.0000 mg | ORAL_TABLET | Freq: Once | ORAL | Status: AC
  Administered 2024-08-15: 650 mg via ORAL
  Filled 2024-08-15: qty 2

## 2024-08-15 MED ORDER — ACETAMINOPHEN 325 MG PO TABS: 325.0000 mg | ORAL_TABLET | Freq: Once | ORAL | Status: DC

## 2024-08-15 NOTE — ED Triage Notes (Signed)
 Patient BIB EMS from [facility] [place].  Patient complaining of bilateral knee pain and lower back pain.  Thinks she might have hurt something when she sat down too fast in her wheelchair today. EMS reports that patient went to sleep and woke with increasing pain.

## 2024-08-15 NOTE — ED Provider Notes (Signed)
 " [facility] DEPT [facility] Emergency Department Provider Note MRN:  [id number]  Arrival date & time: 08/15/2024     Chief Complaint   Knee Pain   History of Present Illness   Rhonda Watkins is a 89 y.o. year-old female with a history of hypertension, diabetes presenting to the ED with chief complaint of knee pain.  Patient explains that she is 89 years old and has trouble getting around.  Today she was trying to sit back down on her wheelchair and fell backwards onto the chair fairly abruptly.  She landed appropriately on her buttocks.  Denies any head trauma or loss of consciousness.  Since then she has been having some increased pain to the lower back and right knee and upper leg.  No other complaints.  No numbness or weakness to the arms or legs, no bowel or bladder symptoms.  Review of Systems  A thorough review of systems was obtained and all systems are negative except as noted in the HPI and PMH.   Patient's Health History    Past Medical History:  Diagnosis Date   Arthritis    Asthma    Diabetes mellitus without complication (HCC)    GERD (gastroesophageal reflux disease)    Hyperlipidemia    Hypertension    Mitral regurgitation    Shortness of breath dyspnea    occ   UTI (lower urinary tract infection)    Vitamin D  deficiency     Past Surgical History:  Procedure Laterality Date   BACK SURGERY  85,04   BLADDER REPAIR  95,05   BUNIONECTOMY Left 08/11/2013   cholecyctectomy     CHOLECYSTECTOMY  08/11/1978   COLON SURGERY  08/12/2003   EXPLORATION MIDDLE EAR Left    vertigo- no hearing now- 89 yrs old   HERNIA REPAIR  01/16/2010   KNEE ARTHROSCOPY     lft 02 rt 09   LAPAROTOMY N/A 11/06/2022   Procedure: EXPLORATORY LAPAROTOMY;  Surgeon: Signe Mitzie LABOR, MD;  Location: [facility] ORS;  Service: General;  Laterality: N/A;   ORIF FEMUR FRACTURE Right 09/13/2023   Procedure: OPEN REDUCTION INTERNAL FIXATION (ORIF) DISTAL FEMUR FRACTURE;  Surgeon: Beverley Evalene BIRCH, MD;  Location: [facility] OR;  Service: Orthopedics;  Laterality: Right;   TONSILLECTOMY      Family History  Problem Relation Age of Onset   Cancer Mother    Heart attack Father    Coronary artery disease Father     Social History   Socioeconomic History   Marital status: Widowed    Spouse name: Not on file   Number of children: Not on file   Years of education: Not on file   Highest education level: Not on file  Occupational History   Not on file  Tobacco Use   Smoking status: Former    Current packs/day: 0.00    Types: Cigarettes    Quit date: 09/17/1958    Years since quitting: 65.9    Passive exposure: Past   Smokeless tobacco: Never  Vaping Use   Vaping status: Never Used  Substance and Sexual Activity   Alcohol use: Yes    Comment: occ   Drug use: Never   Sexual activity: Not on file  Other Topics Concern   Not on file  Social History Narrative   Not on file   Social Drivers of Health   Tobacco Use: Medium Risk (06/23/2024)   Patient History    Smoking Tobacco Use: Former    Smokeless  Tobacco Use: Never    Passive Exposure: Past  Financial Resource Strain: Not on file  Food Insecurity: Patient Unable To Answer (09/13/2023)   Hunger Vital Sign    Worried About Running Out of Food in the Last Year: Patient unable to answer    Ran Out of Food in the Last Year: Patient unable to answer  Transportation Needs: Patient Unable To Answer (09/13/2023)   PRAPARE - Transportation    Lack of Transportation (Medical): Patient unable to answer    Lack of Transportation (Non-Medical): Patient unable to answer  Physical Activity: Not on file  Stress: Not on file  Social Connections: Patient Unable To Answer (09/13/2023)   Social Connection and Isolation Panel    Frequency of Communication with Friends and Family: Patient unable to answer    Frequency of Social Gatherings with Friends and Family: Patient unable to answer    Attends Religious Services: Patient unable to answer     Active Member of Clubs or Organizations: Patient unable to answer    Attends Banker Meetings: Patient unable to answer    Marital Status: Patient unable to answer  Intimate Partner Violence: Patient Unable To Answer (09/13/2023)   Humiliation, Afraid, Rape, and Kick questionnaire    Fear of Current or Ex-Partner: Patient unable to answer    Emotionally Abused: Patient unable to answer    Physically Abused: Patient unable to answer    Sexually Abused: Patient unable to answer  Depression (PHQ2-9): Not on file  Alcohol Screen: Not on file  Housing: Patient Unable To Answer (09/13/2023)   Housing Stability Vital Sign    Unable to Pay for Housing in the Last Year: Patient unable to answer    Number of Times Moved in the Last Year: Not on file    Homeless in the Last Year: Patient unable to answer  Utilities: Patient Unable To Answer (09/13/2023)   [employer] Utilities    Threatened with loss of utilities: Patient unable to answer  Health Literacy: Not on file     Physical Exam   Vitals:   08/15/24 0214 08/15/24 0242  BP:  (!) 166/87  Pulse:  64  Resp:  (!) 22  Temp:  97.6 F (36.4 C)  SpO2: 99% 100%    CONSTITUTIONAL: Well-appearing, NAD NEURO/PSYCH:  Alert and oriented x 3, no focal deficits EYES:  eyes equal and reactive ENT/NECK:  no LAD, no JVD CARDIO: Regular rate, well-perfused, normal S1 and S2 PULM:  CTAB no wheezing or rhonchi GI/GU:  non-distended, non-tender MSK/SPINE:  No gross deformities, no edema SKIN:  no rash, atraumatic   *Additional and/or pertinent findings included in MDM below  Diagnostic and Interventional Summary    EKG Interpretation Date/Time:    Ventricular Rate:    PR Interval:    QRS Duration:    QT Interval:    QTC Calculation:   R Axis:      Text Interpretation:         Labs Reviewed - No data to display  DG Pelvis 1-2 Views  Final Result    DG Lumbar Spine Complete  Final Result    DG Femur Min 2 Views Right  Final  Result    DG Knee Complete 4 Views Right  Final Result      Medications - No data to display   Procedures  /  Critical Care Procedures  ED Course and Medical Decision Making  Initial Impression and Ddx Differential diagnosis includes compression fracture, hip  fracture, orthopedic injury to the femur or knee.  No red flag symptoms to suggest myelopathy, no head trauma.  Legs are neurovascularly intact, no asymmetric swelling.  Seems to have a chronic deformity to the right knee.  Past medical/surgical history that increases complexity of ED encounter: Advanced age  Interpretation of Diagnostics I personally reviewed the x-ray imaging and my interpretation is as follows: No acute fractures    Patient Reassessment and Ultimate Disposition/Management     Resting comfortably on reassessment, continued normal vital signs, no indication for further testing or admission.  Discharged home with return precautions.  Attempted to reach emergency contact, patient's son by phone, no answer.  Patient management required discussion with the following services or consulting groups:  None  Complexity of Problems Addressed Acute illness or injury that poses threat of life of bodily function  Additional Data Reviewed and Analyzed Further history obtained from: Past medical history and medications listed in the EMR and Prior labs/imaging results  Additional Factors Impacting ED Encounter Risk Consideration of hospitalization  Ozell HERO. Theadore, MD [facility] Emergency Medicine [facility] [email or web address]  Final Clinical Impressions(s) / ED Diagnoses     ICD-10-CM   1. Acute pain of right knee  M25.561     2. Acute right-sided low back pain without sciatica  M54.50       ED Discharge Orders     None        Discharge Instructions Discussed with and Provided to Patient:     Discharge Instructions      You were evaluated in the Emergency Department and  after careful evaluation, we did not find any emergent condition requiring admission or further testing in the hospital.  Your exam/testing today is overall reassuring.  No broken bones or emergencies.  Recommend continue use of Tylenol , rest, follow-up with your primary care doctor.  Please return to the Emergency Department if you experience any worsening of your condition.   Thank you for allowing us  to be a part of your care.       Theadore Ozell HERO, MD 08/15/24 [phone]  "

## 2024-08-15 NOTE — ED Notes (Signed)
 Attempted to give report to [facility] at [place].  No answer at number provided

## 2024-08-15 NOTE — Discharge Instructions (Signed)
 You were evaluated in the Emergency Department and after careful evaluation, we did not find any emergent condition requiring admission or further testing in the hospital.  Your exam/testing today is overall reassuring.  No broken bones or emergencies.  Recommend continue use of Tylenol , rest, follow-up with your primary care doctor.  Please return to the Emergency Department if you experience any worsening of your condition.   Thank you for allowing us  to be a part of your care.

## 2024-09-02 ENCOUNTER — Inpatient Hospital Stay (HOSPITAL_COMMUNITY)

## 2024-09-02 ENCOUNTER — Emergency Department (HOSPITAL_COMMUNITY)

## 2024-09-02 ENCOUNTER — Encounter (HOSPITAL_COMMUNITY): Payer: Self-pay | Admitting: Emergency Medicine

## 2024-09-02 ENCOUNTER — Inpatient Hospital Stay (HOSPITAL_COMMUNITY)
Admission: EM | Admit: 2024-09-02 | Discharge: 2024-09-08 | DRG: 389 | Disposition: A | Discharge status: Skilled Nursing Facility

## 2024-09-02 ENCOUNTER — Other Ambulatory Visit: Payer: Self-pay

## 2024-09-02 DIAGNOSIS — Z794 Long term (current) use of insulin: Secondary | ICD-10-CM

## 2024-09-02 DIAGNOSIS — K565 Intestinal adhesions [bands], unspecified as to partial versus complete obstruction: Secondary | ICD-10-CM | POA: Diagnosis present

## 2024-09-02 DIAGNOSIS — Z87891 Personal history of nicotine dependence: Secondary | ICD-10-CM

## 2024-09-02 DIAGNOSIS — M25552 Pain in left hip: Secondary | ICD-10-CM | POA: Diagnosis present

## 2024-09-02 DIAGNOSIS — Z79899 Other long term (current) drug therapy: Secondary | ICD-10-CM | POA: Diagnosis not present

## 2024-09-02 DIAGNOSIS — H9193 Unspecified hearing loss, bilateral: Secondary | ICD-10-CM | POA: Diagnosis present

## 2024-09-02 DIAGNOSIS — I10 Essential (primary) hypertension: Secondary | ICD-10-CM | POA: Diagnosis present

## 2024-09-02 DIAGNOSIS — E876 Hypokalemia: Secondary | ICD-10-CM | POA: Diagnosis present

## 2024-09-02 DIAGNOSIS — Z7901 Long term (current) use of anticoagulants: Secondary | ICD-10-CM

## 2024-09-02 DIAGNOSIS — E785 Hyperlipidemia, unspecified: Secondary | ICD-10-CM | POA: Diagnosis present

## 2024-09-02 DIAGNOSIS — R911 Solitary pulmonary nodule: Secondary | ICD-10-CM | POA: Diagnosis present

## 2024-09-02 DIAGNOSIS — Z888 Allergy status to other drugs, medicaments and biological substances status: Secondary | ICD-10-CM

## 2024-09-02 DIAGNOSIS — Z7951 Long term (current) use of inhaled steroids: Secondary | ICD-10-CM | POA: Diagnosis not present

## 2024-09-02 DIAGNOSIS — G8929 Other chronic pain: Secondary | ICD-10-CM | POA: Diagnosis present

## 2024-09-02 DIAGNOSIS — E1140 Type 2 diabetes mellitus with diabetic neuropathy, unspecified: Secondary | ICD-10-CM | POA: Diagnosis present

## 2024-09-02 DIAGNOSIS — Z8249 Family history of ischemic heart disease and other diseases of the circulatory system: Secondary | ICD-10-CM | POA: Diagnosis not present

## 2024-09-02 DIAGNOSIS — Z66 Do not resuscitate: Secondary | ICD-10-CM | POA: Diagnosis present

## 2024-09-02 DIAGNOSIS — Z9049 Acquired absence of other specified parts of digestive tract: Secondary | ICD-10-CM | POA: Diagnosis not present

## 2024-09-02 DIAGNOSIS — E872 Acidosis, unspecified: Secondary | ICD-10-CM | POA: Diagnosis present

## 2024-09-02 DIAGNOSIS — K56609 Unspecified intestinal obstruction, unspecified as to partial versus complete obstruction: Secondary | ICD-10-CM | POA: Diagnosis present

## 2024-09-02 LAB — CBC WITH DIFFERENTIAL/PLATELET
Abs Immature Granulocytes: 0.06 K/uL (ref 0.00–0.07)
Basophils Absolute: 0.1 K/uL (ref 0.0–0.1)
Basophils Relative: 0 %
Eosinophils Absolute: 0.1 K/uL (ref 0.0–0.5)
Eosinophils Relative: 1 %
HCT: 44.5 % (ref 36.0–46.0)
Hemoglobin: 14.6 g/dL (ref 12.0–15.0)
Immature Granulocytes: 0 %
Lymphocytes Relative: 15 %
Lymphs Abs: 2.3 K/uL (ref 0.7–4.0)
MCH: 29.6 pg (ref 26.0–34.0)
MCHC: 32.8 g/dL (ref 30.0–36.0)
MCV: 90.1 fL (ref 80.0–100.0)
Monocytes Absolute: 1 K/uL (ref 0.1–1.0)
Monocytes Relative: 7 %
Neutro Abs: 12 K/uL — ABNORMAL HIGH (ref 1.7–7.7)
Neutrophils Relative %: 77 %
Platelets: 289 K/uL (ref 150–400)
RBC: 4.94 MIL/uL (ref 3.87–5.11)
RDW: 14.6 % (ref 11.5–15.5)
WBC: 15.6 K/uL — ABNORMAL HIGH (ref 4.0–10.5)
nRBC: 0 % (ref 0.0–0.2)

## 2024-09-02 LAB — COMPREHENSIVE METABOLIC PANEL WITH GFR
ALT: 10 U/L (ref 0–44)
AST: 22 U/L (ref 15–41)
Albumin: 4.4 g/dL (ref 3.5–5.0)
Alkaline Phosphatase: 155 U/L — ABNORMAL HIGH (ref 38–126)
Anion gap: 18 — ABNORMAL HIGH (ref 5–15)
BUN: 16 mg/dL (ref 8–23)
CO2: 19 mmol/L — ABNORMAL LOW (ref 22–32)
Calcium: 10.4 mg/dL — ABNORMAL HIGH (ref 8.9–10.3)
Chloride: 101 mmol/L (ref 98–111)
Creatinine, Ser: 0.71 mg/dL (ref 0.44–1.00)
GFR, Estimated: >60 mL/min
Glucose, Bld: 180 mg/dL — ABNORMAL HIGH (ref 70–99)
Potassium: 3.9 mmol/L (ref 3.5–5.1)
Sodium: 138 mmol/L (ref 135–145)
Total Bilirubin: 0.6 mg/dL (ref 0.0–1.2)
Total Protein: 8 g/dL (ref 6.5–8.1)

## 2024-09-02 LAB — I-STAT CG4 LACTIC ACID, ED
Lactic Acid, Venous: 2.7 mmol/L (ref 0.5–1.9)
Lactic Acid, Venous: 1.6 mmol/L (ref 0.5–1.9)

## 2024-09-02 LAB — HEMOGLOBIN A1C
Hgb A1c MFr Bld: 7 % — ABNORMAL HIGH (ref 4.8–5.6)
Mean Plasma Glucose: 154.2 mg/dL

## 2024-09-02 LAB — GLUCOSE, CAPILLARY
Glucose-Capillary: 148 mg/dL — ABNORMAL HIGH (ref 70–99)
Glucose-Capillary: 171 mg/dL — ABNORMAL HIGH (ref 70–99)
Glucose-Capillary: 178 mg/dL — ABNORMAL HIGH (ref 70–99)

## 2024-09-02 LAB — LIPASE, BLOOD: Lipase: 11 U/L (ref 11–51)

## 2024-09-02 MED ORDER — HYDROMORPHONE HCL 1 MG/ML IJ SOLN
0.5000 mg | Freq: Once | INTRAMUSCULAR | Status: AC
  Administered 2024-09-02: 0.5 mg via INTRAVENOUS
  Filled 2024-09-02: qty 1

## 2024-09-02 MED ORDER — INSULIN ASPART 100 UNIT/ML IJ SOLN
0.0000 [IU] | Freq: Four times a day (QID) | INTRAMUSCULAR | Status: DC
  Administered 2024-09-03: 1 [IU] via SUBCUTANEOUS
  Administered 2024-09-05: 2 [IU] via SUBCUTANEOUS
  Administered 2024-09-06 (×3): 1 [IU] via SUBCUTANEOUS
  Administered 2024-09-07: 2 [IU] via SUBCUTANEOUS
  Administered 2024-09-08: 1 [IU] via SUBCUTANEOUS
  Filled 2024-09-02 (×4): qty 1
  Filled 2024-09-02 (×2): qty 2

## 2024-09-02 MED ORDER — DIATRIZOATE MEGLUMINE & SODIUM 66-10 % PO SOLN
90.0000 mL | Freq: Once | ORAL | Status: AC
  Administered 2024-09-02: 90 mL via NASOGASTRIC
  Filled 2024-09-02: qty 90

## 2024-09-02 MED ORDER — ENOXAPARIN SODIUM 40 MG/0.4ML IJ SOSY
40.0000 mg | PREFILLED_SYRINGE | Freq: Every day | INTRAMUSCULAR | Status: DC
  Administered 2024-09-03 – 2024-09-07 (×5): 40 mg via SUBCUTANEOUS
  Filled 2024-09-02 (×5): qty 0.4

## 2024-09-02 MED ORDER — LACTATED RINGERS IV SOLN: INTRAVENOUS | Status: AC

## 2024-09-02 MED ORDER — ONDANSETRON HCL 4 MG/2ML IJ SOLN
4.0000 mg | Freq: Once | INTRAMUSCULAR | Status: AC
  Administered 2024-09-02: 4 mg via INTRAVENOUS
  Filled 2024-09-02: qty 2

## 2024-09-02 MED ORDER — ENOXAPARIN SODIUM 40 MG/0.4ML IJ SOSY: 40.0000 mg | PREFILLED_SYRINGE | INTRAMUSCULAR | Status: DC

## 2024-09-02 MED ORDER — IOHEXOL 350 MG/ML SOLN
75.0000 mL | Freq: Once | INTRAVENOUS | Status: AC | PRN
  Administered 2024-09-02: 75 mL via INTRAVENOUS

## 2024-09-02 MED ORDER — PANTOPRAZOLE SODIUM 40 MG IV SOLR
40.0000 mg | Freq: Two times a day (BID) | INTRAVENOUS | Status: DC
  Administered 2024-09-02 – 2024-09-06 (×9): 40 mg via INTRAVENOUS
  Filled 2024-09-02 (×9): qty 10

## 2024-09-02 MED ORDER — FLUTICASONE PROPIONATE 50 MCG/ACT NA SUSP: 2.0000 | Freq: Every day | NASAL | Status: DC | PRN

## 2024-09-02 MED ORDER — LACTATED RINGERS IV BOLUS
1000.0000 mL | Freq: Once | INTRAVENOUS | Status: AC
  Administered 2024-09-02: 1000 mL via INTRAVENOUS

## 2024-09-02 MED ORDER — PROCHLORPERAZINE EDISYLATE 10 MG/2ML IJ SOLN
10.0000 mg | Freq: Once | INTRAMUSCULAR | Status: AC
  Administered 2024-09-02: 10 mg via INTRAVENOUS
  Filled 2024-09-02: qty 2

## 2024-09-02 MED ORDER — ACETAMINOPHEN 650 MG RE SUPP: 650.0000 mg | Freq: Four times a day (QID) | RECTAL | Status: DC | PRN

## 2024-09-02 MED ORDER — INSULIN ASPART 100 UNIT/ML IJ SOLN: 0.0000 [IU] | Freq: Three times a day (TID) | INTRAMUSCULAR | Status: DC

## 2024-09-02 MED ORDER — HYDROMORPHONE HCL 1 MG/ML IJ SOLN
0.5000 mg | INTRAMUSCULAR | Status: DC | PRN
  Administered 2024-09-02 – 2024-09-07 (×9): 0.5 mg via INTRAVENOUS
  Filled 2024-09-02 (×2): qty 0.5
  Filled 2024-09-02: qty 1
  Filled 2024-09-02 (×6): qty 0.5

## 2024-09-02 MED ORDER — FENTANYL CITRATE (PF) 50 MCG/ML IJ SOSY
50.0000 ug | PREFILLED_SYRINGE | Freq: Once | INTRAMUSCULAR | Status: AC
  Administered 2024-09-02: 50 ug via INTRAVENOUS
  Filled 2024-09-02: qty 1

## 2024-09-02 MED ORDER — BUDESONIDE 0.25 MG/2ML IN SUSP
0.2500 mg | Freq: Two times a day (BID) | RESPIRATORY_TRACT | Status: DC
  Administered 2024-09-02 – 2024-09-08 (×11): 0.25 mg via RESPIRATORY_TRACT
  Filled 2024-09-02 (×11): qty 2

## 2024-09-02 MED ORDER — ACETAMINOPHEN 325 MG PO TABS: 650.0000 mg | ORAL_TABLET | Freq: Four times a day (QID) | ORAL | Status: DC | PRN

## 2024-09-02 MED ORDER — HYDRALAZINE HCL 20 MG/ML IJ SOLN: 5.0000 mg | Freq: Four times a day (QID) | INTRAMUSCULAR | Status: DC | PRN

## 2024-09-02 MED ORDER — ONDANSETRON HCL 4 MG/2ML IJ SOLN
4.0000 mg | Freq: Four times a day (QID) | INTRAMUSCULAR | Status: DC | PRN
  Filled 2024-09-02: qty 2

## 2024-09-02 MED ORDER — PIPERACILLIN-TAZOBACTAM 3.375 G IVPB
3.3750 g | Freq: Three times a day (TID) | INTRAVENOUS | Status: DC
  Administered 2024-09-02 – 2024-09-03 (×4): 3.375 g via INTRAVENOUS
  Filled 2024-09-02 (×5): qty 50

## 2024-09-02 MED ORDER — ALBUTEROL SULFATE (2.5 MG/3ML) 0.083% IN NEBU: 2.5000 mg | INHALATION_SOLUTION | Freq: Four times a day (QID) | RESPIRATORY_TRACT | Status: DC | PRN

## 2024-09-02 NOTE — ED Notes (Addendum)
 TRN successfully placed 16F NGT to L nare without complication.  Marked at 65cm. Auscultated air in stomach. KUB ordered STAT. immediately drained and now on LWS.   Last imported Vital Signs BP (!) 154/63   Pulse 84   Temp (!) 96.7 F (35.9 C) (Axillary)   Resp 16   Ht 5' 2 (1.575 m)   Wt 123 lb 14.4 oz (56.2 kg)   SpO2 100%   BMI 22.66 kg/m   Trending CBC Recent Labs    09/02/24 0222  WBC 15.6*  HGB 14.6  HCT 44.5  PLT 289    Trending Coag's No results for input(s): APTT, INR in the last 72 hours.  Trending BMET Recent Labs    09/02/24 0222  NA 138  K 3.9  CL 101  CO2 19*  BUN 16  CREATININE 0.71  GLUCOSE 180*    Tabb Croghan W  Trauma Response RN  Please call TRN at [phone] for further assistance.

## 2024-09-02 NOTE — ED Provider Notes (Signed)
 " [place] EMERGENCY DEPARTMENT AT [facility] Provider Note   CSN: [id number] Arrival date & time: 09/02/24  9841     Patient presents with: Abdominal Pain   Rhonda Watkins is a 89 y.o. female.   89 year old female that brought in by EMS secondary abdominal pain.  Patient has a history of multiple abdominal surgeries and back surgeries and multiple other medical problems.  She states that she started have abdominal pain earlier tonight and then started to have multiple episodes of nonbloody nonbilious emesis prior to my evaluation.  She states she only had 1 round of emesis at home but then was having emesis with EMS as well.  Continuing to throw up here.  States that she had clams and spaghetti tonight, neither of which were old or had any abnormal flavor/odor.  No recent illnesses.  No fevers   Abdominal Pain      Prior to Admission medications  Medication Sig Start Date End Date Taking? Authorizing Provider  acetaminophen  (TYLENOL  8 HOUR) 650 MG CR tablet Take 1 tablet (650 mg total) by mouth every 6 (six) hours as needed for pain or fever. 07/06/23   Charlyn Sora, MD  albuterol  (VENTOLIN  HFA) 108 (90 Base) MCG/ACT inhaler Inhale 1-2 puffs into the lungs every 6 (six) hours as needed for wheezing or shortness of breath. 05/25/24   Tobie Arleta SQUIBB, MD  atorvastatin  (LIPITOR) 10 MG tablet Take 10 mg by mouth daily. 12/27/17   [provider]  docusate sodium  (COLACE) 100 MG capsule Take 1 capsule (100 mg total) by mouth 2 (two) times daily. 09/17/23   Awanda City, MD  enoxaparin  (LOVENOX ) 40 MG/0.4ML injection Inject 0.4 mLs (40 mg total) into the skin daily for 14 days. 09/18/23 10/02/23  Awanda City, MD  famotidine  (PEPCID ) 20 MG tablet Take 1 tablet (20 mg total) by mouth daily as needed for heartburn or indigestion. 05/25/24   Tobie Arleta SQUIBB, MD  fluticasone  (FLONASE ) 50 MCG/ACT nasal spray Place 2 sprays into both nostrils daily as needed for allergies or rhinitis.  05/25/24   Tobie Arleta SQUIBB, MD  Fluticasone  Furoate (ARNUITY ELLIPTA ) 100 MCG/ACT AEPB Inhale 1 puff into the lungs daily. 05/25/24   Tobie Arleta SQUIBB, MD  furosemide  (LASIX ) 40 MG tablet Take 40 mg by mouth daily. 08/31/16   [provider]  gabapentin  (NEURONTIN ) 100 MG capsule Take 1 capsule (100 mg total) by mouth 3 (three) times daily for 14 days. 07/19/23 11/14/23  Keith, Kayla N, PA-C  HYDROcodone -acetaminophen  (NORCO/VICODIN) 5-325 MG tablet Take 1 tablet by mouth every 6 (six) hours as needed for moderate pain (pain score 4-6) or severe pain (pain score 7-10). Patient not taking: Reported on 11/25/2023 09/17/23   Awanda City, MD  LANTUS  SOLOSTAR 100 UNIT/ML Solostar Pen Inject 12 Units into the skin at bedtime.    [provider]  methocarbamol  (ROBAXIN ) 500 MG tablet Take 1 tablet (500 mg total) by mouth every 6 (six) hours as needed for muscle spasms. Patient not taking: Reported on 11/25/2023 09/17/23   Awanda City, MD  omeprazole  (PRILOSEC ) 20 MG capsule Take 1 capsule (20 mg total) by mouth daily. 05/25/24   Tobie Arleta SQUIBB, MD  polyethylene glycol (MIRALAX  / GLYCOLAX ) 17 g packet Take 17 g by mouth daily as needed (constipation). 09/17/23   Awanda City, MD    Allergies: Jardiance [empagliflozin], Metformin and related, and Tradjenta [linagliptin]    Review of Systems  Gastrointestinal:  Positive for abdominal pain.  Updated Vital Signs BP (!) 155/78   Pulse 87   Temp (!) 97.5 F (36.4 C)   Resp 12   Ht 5' 2 (1.575 m)   Wt 56.2 kg   SpO2 100%   BMI 22.66 kg/m   Physical Exam Vitals and nursing note reviewed.  Constitutional:      Appearance: She is well-developed.  HENT:     Head: Normocephalic and atraumatic.  Cardiovascular:     Rate and Rhythm: Normal rate and regular rhythm.  Pulmonary:     Effort: No respiratory distress.     Breath sounds: No stridor.  Abdominal:     General: There is no distension.     Tenderness: There is generalized abdominal  tenderness.  Musculoskeletal:     Cervical back: Normal range of motion.  Skin:    General: Skin is warm and dry.  Neurological:     Mental Status: She is alert.     (all labs ordered are listed, but only abnormal results are displayed) Labs Reviewed  CBC WITH DIFFERENTIAL/PLATELET - Abnormal; Notable for the following components:      Result Value   WBC 15.6 (*)    Neutro Abs 12.0 (*)    All other components within normal limits  COMPREHENSIVE METABOLIC PANEL WITH GFR - Abnormal; Notable for the following components:   CO2 19 (*)    Glucose, Bld 180 (*)    Calcium  10.4 (*)    Alkaline Phosphatase 155 (*)    Anion gap 18 (*)    All other components within normal limits  I-STAT CG4 LACTIC ACID, ED - Abnormal; Notable for the following components:   Lactic Acid, Venous 2.7 (*)    All other components within normal limits  LIPASE, BLOOD  URINALYSIS, W/ REFLEX TO CULTURE (INFECTION SUSPECTED)  I-STAT CG4 LACTIC ACID, ED    EKG: EKG Interpretation Date/Time:  Friday September 02 2024 03:40:29 EST Ventricular Rate:  88 PR Interval:  368 QRS Duration:  70 QT Interval:  346 QTC Calculation: 418 R Axis:   60  Text Interpretation: appears to be sinus otherwise uninterpretable secondary to artifact and baseline wander Confirmed by Lorette Mayo [phone]) on 09/02/2024 3:47:39 AM  Radiology: CT Angio Abd/Pel W and/or Wo Contrast Result Date: 09/02/2024 CLINICAL DATA:  Mesenteric ischemia. EXAM: CTA ABDOMEN AND PELVIS WITHOUT AND WITH CONTRAST TECHNIQUE: Multidetector CT imaging of the abdomen and pelvis was performed using the standard protocol during bolus administration of intravenous contrast. Multiplanar reconstructed images and MIPs were obtained and reviewed to evaluate the vascular anatomy. RADIATION DOSE REDUCTION: This exam was performed according to the departmental dose-optimization program which includes automated exposure control, adjustment of the mA and/or kV according to  patient size and/or use of iterative reconstruction technique. CONTRAST:  75mL OMNIPAQUE  IOHEXOL  350 MG/ML SOLN COMPARISON:  Outside abdomen and pelvis CT 09/30/2023. FINDINGS: VASCULAR Aorta: Normal caliber aorta without aneurysm, dissection, vasculitis or significant stenosis. Advanced atherosclerotic disease. Abdominal aorta is obscured in some areas by motion artifact from spinal fixation hardware. Celiac: Patent without evidence of aneurysm, dissection, vasculitis or significant stenosis. Atherosclerosis. SMA: Patent without evidence of aneurysm, dissection, vasculitis or significant stenosis. Atherosclerosis. Renals: Both renal arteries patent with moderate to advanced atherosclerotic calcified plaque in the ostial region bilaterally. IMA: Obscured by beam hardening artifact proximally. Inflow: Patent without evidence of aneurysm, dissection, vasculitis or significant stenosis. Proximal Outflow: Atherosclerotic disease. Veins: No obvious venous abnormality within the limitations of this arterial phase study. Review of the  MIP images confirms the above findings. NON-VASCULAR Lower chest: 4 mm left lower lobe pulmonary nodule on 23/8. Hepatobiliary: No suspicious focal abnormality within the liver parenchyma. A tiny hypodensity in the liver parenchyma is too small to characterize but is statistically most likely benign. No followup imaging is recommended. Gallbladder is surgically absent. Mild intrahepatic biliary duct prominence. Common bile duct measures up to 8 mm diameter. 9 mm calcification is identified in the region of the ampulla and appears to be in line with the common bile duct (see axial images 19 and 20 of series 2) raising concern for distal common bile duct stone. Pancreas: Pancreas is diffusely atrophic with diffuse dilatation of the main pancreatic duct. See paragraph above. Spleen: No splenomegaly. No suspicious focal mass lesion. Adrenals/Urinary Tract: No adrenal nodule or mass. Right kidney  unremarkable. 4.9 cm exophytic cyst upper pole left kidney has a single thin internal septation compatible with Bosniak II category. Immediately adjacent Bosniak I 2.4 cm left renal cyst. No evidence for hydroureter. Bladder is distended. Stomach/Bowel: Stomach is distended with food fluid and gas. Duodenum is normally positioned as is the ligament of Treitz. Dilated small bowel loops in the abdomen and upper pelvis measure up to 3.4 cm diameter. The most dilated loops show ill-defined walls, subtle perienteric edema in fecalization of enteric contents. No abrupt or discrete transition zone can be identified although the small bowel appears to be tethered anteriorly up against the peritoneum just deep to the rectus sheath where there is ill-defined amorphous soft tissue in central gas evident (see coronal images 102-105 of series 11). In retrospect, previous outside CT from 09/30/2023 showed soft tissue thickening and probable granulation inter just deep to the midline rectus sheath with apparent intraluminal containing gas collection. Small bowel in the right lower quadrant appears relatively decompressed. Neither the terminal ileum nor the appendix are well visualized. Colonic diverticulosis evident without definite features of diverticulitis. Lymphatic: No abdominal lymphadenopathy. No pelvic sidewall lymphadenopathy. Reproductive: There is no adnexal mass. Other: No substantial intraperitoneal free fluid. Musculoskeletal: Bones are diffusely demineralized. Posterior spinal fusion hardware noted L3-4. IMPRESSION: 1. No evidence for abdominal aortic aneurysm or dissection. No evidence for mesenteric arterial occlusion. 2. Dilated small bowel loops in the abdomen and upper pelvis measure up to 3.4 cm diameter. The most dilated loops show ill-defined walls, subtle perienteric edema and fecalization of enteric contents. No abrupt or discrete transition zone can be identified although the small bowel appears to be  tethered anteriorly up against the peritoneum just deep to the rectus sheath where there is ill-defined amorphous soft tissue with central gas evident that appears to be extraluminal but contain/loculated within the soft tissue. Previous outside CT from 09/30/2023 showed soft tissue thickening and probable granulation just deep to the midline rectus sheath with apparent extraluminal gas collection. Imaging features are compatible with small bowel obstruction, likely secondary to adhesions and/or desmoplastic/neoplastic process in the anterior upper pelvis incorporating small bowel loops. Chronic contained perforation or neoplasm could have this appearance. 3. 9 mm calcification identified in the region of the ampulla and appears to be in line with the dilated common bile duct raising concern for distal common bile duct stone. Pancreatic parenchyma is diffusely atrophic around the dilated duct. Mild intrahepatic biliary duct prominence with common bile duct measuring up to 8 mm diameter. 4. 4 mm left lower lobe pulmonary nodule. No follow-up needed if patient is low-risk.This recommendation follows the consensus statement: Guidelines for Management of Incidental Pulmonary Nodules Detected on  CT Images: From the Fleischner Society 2017; Radiology 2017; 284:228-243. 5. Colonic diverticulosis without diverticulitis. Electronically Signed   By: Camellia Candle M.D.   On: 09/02/2024 05:53     Procedures   Medications Ordered in the ED  lactated ringers  infusion (has no administration in time range)  lactated ringers  bolus 1,000 mL (0 mLs Intravenous Stopped 09/02/24 0606)  ondansetron  (ZOFRAN ) injection 4 mg (4 mg Intravenous Given 09/02/24 0240)  fentaNYL  (SUBLIMAZE ) injection 50 mcg (50 mcg Intravenous Given 09/02/24 0240)  HYDROmorphone  (DILAUDID ) injection 0.5 mg (0.5 mg Intravenous Given 09/02/24 0346)  iohexol  (OMNIPAQUE ) 350 MG/ML injection 75 mL (75 mLs Intravenous Contrast Given 09/02/24 0440)   prochlorperazine (COMPAZINE) injection 10 mg (10 mg Intravenous Given 09/02/24 0545)  HYDROmorphone  (DILAUDID ) injection 0.5 mg (0.5 mg Intravenous Given 09/02/24 0603)                                    Medical Decision Making Amount and/or Complexity of Data Reviewed Labs: ordered. Radiology: ordered. ECG/medicine tests: ordered.  Risk Prescription drug management. Decision regarding hospitalization.   Leukocytosis, hypertensive, diffuse abdominal pain, cta done to r/o obstruction vs mesenteric ischemia vs other ocmplications. Difficult getting nausea/pain under control after multiple rounds of medications.   On my titration CT scan it does appear that the patient has some air-fluid levels and quite a bit of fluid in her stomach.   Radiology read reviewed with likely SBO possibly related adhesions. Also with questionable distal CBD stone. Has had surgery in past, so NGT placed and surgery consulted.   D/w Dr. Sebastian. Will see this AM for further recommendations.   D/w Dr. Marcene with [facility] for admission.   Final diagnoses:  Small bowel obstruction [facility])    ED Discharge Orders     None          Barnell Shieh, Selinda, MD 09/02/24 [phone]  "

## 2024-09-02 NOTE — Consult Note (Addendum)
 "    Rhonda Watkins 07-13-1932  [id number].    Requesting MD: Marcene, MD Chief Complaint/Reason for Consult: SBO  HPI:  Rhonda Watkins is a 89 y/o F with PMH HTN, HLD, asthma, DM, and multiple abdominal surgeries who presents with acute onset abdominal pain and emesis. Pain started 1/22 in the evening described as central pain and her abdomen feeling hard. Assocaited with non-bloody emesis. History this morning is limited by patients somnolence, she says she did not get much rest and is falling asleep during our conversation. She does report ongoing nausea this morning.  Surgical history: cholecystectomy 1980, partial colectomy 2005, hernia repair 2011, ex lap LOA 2024 for SBO At baseline she tells me that she lives alone, but she has 5 children.    ROS: Review of Systems  All other systems reviewed and are negative.   Family History  Problem Relation Age of Onset   Cancer Mother    Heart attack Father    Coronary artery disease Father     Past Medical History:  Diagnosis Date   Arthritis    Asthma    Diabetes mellitus without complication (HCC)    GERD (gastroesophageal reflux disease)    Hyperlipidemia    Hypertension    Mitral regurgitation    Shortness of breath dyspnea    occ   UTI (lower urinary tract infection)    Vitamin D  deficiency     Past Surgical History:  Procedure Laterality Date   BACK SURGERY  85,04   BLADDER REPAIR  95,05   BUNIONECTOMY Left 08/11/2013   cholecyctectomy     CHOLECYSTECTOMY  08/11/1978   COLON SURGERY  08/12/2003   EXPLORATION MIDDLE EAR Left    vertigo- no hearing now- 89 yrs old   HERNIA REPAIR  01/16/2010   KNEE ARTHROSCOPY     lft 02 rt 09   LAPAROTOMY N/A 11/06/2022   Procedure: EXPLORATORY LAPAROTOMY;  Surgeon: Signe Mitzie LABOR, MD;  Location: [facility];  Service: General;  Laterality: N/A;   ORIF FEMUR FRACTURE Right 09/13/2023   Procedure: OPEN REDUCTION INTERNAL FIXATION (ORIF) DISTAL FEMUR FRACTURE;  Surgeon: Beverley Evalene BIRCH, MD;  Location: [facility];  Service: Orthopedics;  Laterality: Right;   TONSILLECTOMY      Social History:  reports that she quit smoking about 66 years ago. Her smoking use included cigarettes. She has been exposed to tobacco smoke. She has never used smokeless tobacco. She reports current alcohol use. She reports that she does not use drugs.  Allergies: Allergies[1]  (Not in a hospital admission)    Physical Exam: Blood pressure (!) 155/78, pulse 87, temperature (!) 97.5 F (36.4 C), resp. rate 12, height 5' 2 (1.575 m), weight 56.2 kg, SpO2 100%. General: Pleasant white elderly female, laying on hospital bed, appears stated age, NAD. HEENT: head -normocephalic, atraumatic; Eyes: PERRLA, no conjunctival injection; anicteric sclerae  Neck- Trachea is midline CV- RRR, pedal pulses 2+ bilaterally Pulm- breathing is non-labored ORA Abd- soft, moderate distention, mild tenderness in periumbilical region- fullness in this region, there is a <2 cm firm lesion palpable 2-4 cm to the right of the umbilicus (old fascial suture vs soft tissue lesion?).  NT/ND, appropriate bowel sounds in 4 quadrants, no masses, hernias, or organomegaly. GU- deferred  MSK- UE/LE symmetrical, no cyanosis, clubbing, or edema. Neuro- CN II-XII grossly in tact, no paresthesias. Psych- Alert and Oriented x3 with appropriate affect Skin: warm and dry, no rashes or lesions   Results for orders  placed or performed during the hospital encounter of 09/02/24 (from the past 48 hours)  CBC with Differential     Status: Abnormal   Collection Time: 09/02/24  2:22 AM  Result Value Ref Range   WBC 15.6 (H) 4.0 - 10.5 K/uL   RBC 4.94 3.87 - 5.11 MIL/uL   Hemoglobin 14.6 12.0 - 15.0 g/dL   HCT 55.4 63.9 - 53.9 %   MCV 90.1 80.0 - 100.0 fL   MCH 29.6 26.0 - 34.0 pg   MCHC 32.8 30.0 - 36.0 g/dL   RDW 85.3 88.4 - 84.4 %   Platelets 289 150 - 400 K/uL   nRBC 0.0 0.0 - 0.2 %   Neutrophils Relative % 77 %   Neutro Abs  12.0 (H) 1.7 - 7.7 K/uL   Lymphocytes Relative 15 %   Lymphs Abs 2.3 0.7 - 4.0 K/uL   Monocytes Relative 7 %   Monocytes Absolute 1.0 0.1 - 1.0 K/uL   Eosinophils Relative 1 %   Eosinophils Absolute 0.1 0.0 - 0.5 K/uL   Basophils Relative 0 %   Basophils Absolute 0.1 0.0 - 0.1 K/uL   Immature Granulocytes 0 %   Abs Immature Granulocytes 0.06 0.00 - 0.07 K/uL    Comment: Performed at [facility], [address], [place], [place] [address]  Comprehensive metabolic panel     Status: Abnormal   Collection Time: 09/02/24  2:22 AM  Result Value Ref Range   Sodium 138 135 - 145 mmol/L   Potassium 3.9 3.5 - 5.1 mmol/L   Chloride 101 98 - 111 mmol/L   CO2 19 (L) 22 - 32 mmol/L   Glucose, Bld 180 (H) 70 - 99 mg/dL    Comment: Glucose reference range applies only to samples taken after fasting for at least 8 hours.   BUN 16 8 - 23 mg/dL   Creatinine, Ser 9.28 0.44 - 1.00 mg/dL   Calcium  10.4 (H) 8.9 - 10.3 mg/dL   Total Protein 8.0 6.5 - 8.1 g/dL   Albumin  4.4 3.5 - 5.0 g/dL   AST 22 15 - 41 U/L    Comment: HEMOLYSIS AT THIS LEVEL MAY AFFECT RESULT   ALT 10 0 - 44 U/L   Alkaline Phosphatase 155 (H) 38 - 126 U/L   Total Bilirubin 0.6 0.0 - 1.2 mg/dL   GFR, Estimated >39 >39 mL/min    Comment: (NOTE) Calculated using the CKD-EPI Creatinine Equation (2021)    Anion gap 18 (H) 5 - 15    Comment: Performed at [facility], [address], [place], [place] [address]  Lipase, blood     Status: None   Collection Time: 09/02/24  2:22 AM  Result Value Ref Range   Lipase 11 11 - 51 U/L    Comment: Performed at [facility], [address], [place], [place] [address]  I-Stat CG4 Lactic Acid     Status: Abnormal   Collection Time: 09/02/24  3:16 AM  Result Value Ref Range   Lactic Acid, Venous 2.7 (HH) 0.5 - 1.9 mmol/L   Comment NOTIFIED PHYSICIAN   I-Stat CG4 Lactic Acid     Status: None   Collection Time: 09/02/24  5:55 AM  Result Value Ref Range   Lactic Acid, Venous  1.6 0.5 - 1.9 mmol/L   CT Angio Abd/Pel W and/or Wo Contrast Result Date: 09/02/2024 CLINICAL DATA:  Mesenteric ischemia. EXAM: CTA ABDOMEN AND PELVIS WITHOUT AND WITH CONTRAST TECHNIQUE: Multidetector  CT imaging of the abdomen and pelvis was performed using the standard protocol during bolus administration of intravenous contrast. Multiplanar reconstructed images and MIPs were obtained and reviewed to evaluate the vascular anatomy. RADIATION DOSE REDUCTION: This exam was performed according to the departmental dose-optimization program which includes automated exposure control, adjustment of the mA and/or kV according to patient size and/or use of iterative reconstruction technique. CONTRAST:  75mL OMNIPAQUE  IOHEXOL  350 MG/ML SOLN COMPARISON:  Outside abdomen and pelvis CT 09/30/2023. FINDINGS: VASCULAR Aorta: Normal caliber aorta without aneurysm, dissection, vasculitis or significant stenosis. Advanced atherosclerotic disease. Abdominal aorta is obscured in some areas by motion artifact from spinal fixation hardware. Celiac: Patent without evidence of aneurysm, dissection, vasculitis or significant stenosis. Atherosclerosis. SMA: Patent without evidence of aneurysm, dissection, vasculitis or significant stenosis. Atherosclerosis. Renals: Both renal arteries patent with moderate to advanced atherosclerotic calcified plaque in the ostial region bilaterally. IMA: Obscured by beam hardening artifact proximally. Inflow: Patent without evidence of aneurysm, dissection, vasculitis or significant stenosis. Proximal Outflow: Atherosclerotic disease. Veins: No obvious venous abnormality within the limitations of this arterial phase study. Review of the MIP images confirms the above findings. NON-VASCULAR Lower chest: 4 mm left lower lobe pulmonary nodule on 23/8. Hepatobiliary: No suspicious focal abnormality within the liver parenchyma. A tiny hypodensity in the liver parenchyma is too small to characterize but is  statistically most likely benign. No followup imaging is recommended. Gallbladder is surgically absent. Mild intrahepatic biliary duct prominence. Common bile duct measures up to 8 mm diameter. 9 mm calcification is identified in the region of the ampulla and appears to be in line with the common bile duct (see axial images 19 and 20 of series 2) raising concern for distal common bile duct stone. Pancreas: Pancreas is diffusely atrophic with diffuse dilatation of the main pancreatic duct. See paragraph above. Spleen: No splenomegaly. No suspicious focal mass lesion. Adrenals/Urinary Tract: No adrenal nodule or mass. Right kidney unremarkable. 4.9 cm exophytic cyst upper pole left kidney has a single thin internal septation compatible with Bosniak II category. Immediately adjacent Bosniak I 2.4 cm left renal cyst. No evidence for hydroureter. Bladder is distended. Stomach/Bowel: Stomach is distended with food fluid and gas. Duodenum is normally positioned as is the ligament of Treitz. Dilated small bowel loops in the abdomen and upper pelvis measure up to 3.4 cm diameter. The most dilated loops show ill-defined walls, subtle perienteric edema in fecalization of enteric contents. No abrupt or discrete transition zone can be identified although the small bowel appears to be tethered anteriorly up against the peritoneum just deep to the rectus sheath where there is ill-defined amorphous soft tissue in central gas evident (see coronal images 102-105 of series 11). In retrospect, previous outside CT from 09/30/2023 showed soft tissue thickening and probable granulation inter just deep to the midline rectus sheath with apparent intraluminal containing gas collection. Small bowel in the right lower quadrant appears relatively decompressed. Neither the terminal ileum nor the appendix are well visualized. Colonic diverticulosis evident without definite features of diverticulitis. Lymphatic: No abdominal lymphadenopathy. No  pelvic sidewall lymphadenopathy. Reproductive: There is no adnexal mass. Other: No substantial intraperitoneal free fluid. Musculoskeletal: Bones are diffusely demineralized. Posterior spinal fusion hardware noted L3-4. IMPRESSION: 1. No evidence for abdominal aortic aneurysm or dissection. No evidence for mesenteric arterial occlusion. 2. Dilated small bowel loops in the abdomen and upper pelvis measure up to 3.4 cm diameter. The most dilated loops show ill-defined walls, subtle perienteric edema and fecalization of enteric  contents. No abrupt or discrete transition zone can be identified although the small bowel appears to be tethered anteriorly up against the peritoneum just deep to the rectus sheath where there is ill-defined amorphous soft tissue with central gas evident that appears to be extraluminal but contain/loculated within the soft tissue. Previous outside CT from 09/30/2023 showed soft tissue thickening and probable granulation just deep to the midline rectus sheath with apparent extraluminal gas collection. Imaging features are compatible with small bowel obstruction, likely secondary to adhesions and/or desmoplastic/neoplastic process in the anterior upper pelvis incorporating small bowel loops. Chronic contained perforation or neoplasm could have this appearance. 3. 9 mm calcification identified in the region of the ampulla and appears to be in line with the dilated common bile duct raising concern for distal common bile duct stone. Pancreatic parenchyma is diffusely atrophic around the dilated duct. Mild intrahepatic biliary duct prominence with common bile duct measuring up to 8 mm diameter. 4. 4 mm left lower lobe pulmonary nodule. No follow-up needed if patient is low-risk.This recommendation follows the consensus statement: Guidelines for Management of Incidental Pulmonary Nodules Detected on CT Images: From the Fleischner Society 2017; Radiology 2017; 284:228-243. 5. Colonic diverticulosis  without diverticulitis. Electronically Signed   By: Camellia Candle M.D.   On: 09/02/2024 05:53      Assessment/Plan SBO, likely adhesive due to history of previous abdominal surgeries, cannot exclude contained perforation of the small bowel into the abdominal wall - afebrile, hemodynamically stable, on room air - WBC 15.6, hgb 14.6  - CTA abd pelvis shows SBO; small bowel appears to be tethered anteriorly up against the peritoneum just deep to the rectus sheath where there is ill-defined amorphous soft tissue with central gas evident that appears to be extraluminal but contain/loculated within the soft tissue.  This was also appreciable on a CT from 09/2023. She has fecalization of small bowel contents. - no emergent surgical needs. Attempt non-operative management with nasogastric decompression and SBO protocol. Failed NGT placement this morning. I have asked another nurse to attempt. - Currently on Zosyn  -- will discuss abx with my attending. She as afebrile with WBC 15. No large abscess. No abdominal wall cellulitis. I think it is reasonable to stop antibiotics at present and give supportive care/IVF and re-check AM labs.  Also reasonable to complete a short course of abx (4-5 days) for this.   ?choledocholithiasis, remote history of cholecystectomy - CT also suggested possible distal CBD stone, which is being evaluated today with an ultrasound. Alk phos 155, otherwise normal. If this is not conclusive she may need MRCP To exclude.only mild biliary prominence (8mm).  FEN - NPO, NG to LIWS, IVF per primary VTE - SCD's, Lovenox  ID - UA pending,  Admit - [facility] service     I reviewed nursing notes, ED provider notes, hospitalist notes, last 24 h vitals and pain scores, last 48 h intake and output, last 24 h labs and trends, and last 24 h imaging results.  Rhonda GORMAN Pringle, PA-C [facility] 09/02/2024, 8:09 AM Please see Amion for pager number during day hours 7:00am-4:30pm or  7:00am -11:30am on weekends     [1]  Allergies Allergen Reactions   Jardiance [Empagliflozin] Other (See Comments)    Fatigue    Metformin And Related Other (See Comments)    Fatigue    Tradjenta [Linagliptin] Other (See Comments)    Unknown reaction   "

## 2024-09-02 NOTE — ED Notes (Signed)
 Patient transported to Ultrasound

## 2024-09-02 NOTE — H&P (Addendum)
 " History and Physical    Patient: Rhonda Watkins FMW:[id number] DOB: [birth year] DOA: 09/02/2024 DOS: the patient was seen and examined on 09/02/2024 PCP: Charlott Dorn LABOR, MD  Patient coming from: Home  Chief Complaint:  Chief Complaint  Patient presents with   Abdominal Pain   HPI: Rhonda Watkins is a 89 y.o. female with medical history significant of diabetes, hypertension, hyperlipidemia, asthma, GERD, prior history SBO, status post exploratory laparotomy lysis of adhesion for 90 minutes 10/2022, previous cholecystectomy, colon surgery, hernia repair, previous abdominal surgery who presents complaining of abdominal pain that started the night of admission, associated with nonbloody nonbilious emesis.  Patient is hard of hearing, does not have her hearing aid on . She is sleepy on my evaluation, but open eyes to voice.  She is complaining of abdominal pain, unable to quantify if all qualify for pain.  Reports lower abdominal pain.  Last bowel movement the day prior to admission.  She is not passing any gas.  No further vomiting reported .   Evaluation in the ED: Sodium 138, potassium 3.9, CO2 19, glucose 180, calcium  10.4, anion gap 18, alkaline phosphatase 155, lactic acid 2.7, white blood cell 15, hemoglobin 14, platelets 289 CT angio abdomen and pelvis: No evidence for abdominal aortic aneurysm or dissection. No evidence for mesenteric arterial occlusion. Dilated small bowel loops in the abdomen and upper pelvis measure up to 3.4 cm diameter. The most dilated loops show ill-defined walls, subtle perienteric edema and fecalization of enteric contents. No abrupt or discrete transition zone can be identified although the small bowel appears to be tethered anteriorly up against the peritoneum just deep to the rectus sheath where there is ill-defined amorphous soft tissue with central gas evident that appears to be extraluminal but contain/loculated within the soft tissue. Chronic contained  perforation or neoplasm could have this appearance. 3. 9 mm calcification identified in the region of the ampulla and appears to be in line with the dilated common bile duct raising concern for distal common bile duct stone. Colonic diverticulosis without diverticulitis.    Review of Systems: As mentioned in the history of present illness. All other systems reviewed and are negative. Past Medical History:  Diagnosis Date   Arthritis    Asthma    Diabetes mellitus without complication (HCC)    GERD (gastroesophageal reflux disease)    Hyperlipidemia    Hypertension    Mitral regurgitation    Shortness of breath dyspnea    occ   UTI (lower urinary tract infection)    Vitamin D  deficiency    Past Surgical History:  Procedure Laterality Date   BACK SURGERY  85,04   BLADDER REPAIR  95,05   BUNIONECTOMY Left 08/11/2013   cholecyctectomy     CHOLECYSTECTOMY  08/11/1978   COLON SURGERY  08/12/2003   EXPLORATION MIDDLE EAR Left    vertigo- no hearing now- 89 yrs old   HERNIA REPAIR  01/16/2010   KNEE ARTHROSCOPY     lft 02 rt 09   LAPAROTOMY N/A 11/06/2022   Procedure: EXPLORATORY LAPAROTOMY;  Surgeon: Signe Mitzie LABOR, MD;  Location: [facility];  Service: General;  Laterality: N/A;   ORIF FEMUR FRACTURE Right 09/13/2023   Procedure: OPEN REDUCTION INTERNAL FIXATION (ORIF) DISTAL FEMUR FRACTURE;  Surgeon: Beverley Evalene BIRCH, MD;  Location: [facility];  Service: Orthopedics;  Laterality: Right;   TONSILLECTOMY     Social History:  reports that she quit smoking about 66 years ago. Her smoking use  included cigarettes. She has been exposed to tobacco smoke. She has never used smokeless tobacco. She reports current alcohol use. She reports that she does not use drugs.  Allergies[1]  Family History  Problem Relation Age of Onset   Cancer Mother    Heart attack Father    Coronary artery disease Father     Prior to Admission medications  Medication Sig Start Date End Date Taking? Authorizing  Provider  acetaminophen  (TYLENOL  8 HOUR) 650 MG CR tablet Take 1 tablet (650 mg total) by mouth every 6 (six) hours as needed for pain or fever. 07/06/23   Charlyn Sora, MD  albuterol  (VENTOLIN  HFA) 108 (90 Base) MCG/ACT inhaler Inhale 1-2 puffs into the lungs every 6 (six) hours as needed for wheezing or shortness of breath. 05/25/24   Tobie Arleta SQUIBB, MD  atorvastatin  (LIPITOR) 10 MG tablet Take 10 mg by mouth daily. 12/27/17   [provider]  docusate sodium  (COLACE) 100 MG capsule Take 1 capsule (100 mg total) by mouth 2 (two) times daily. 09/17/23   Awanda City, MD  enoxaparin  (LOVENOX ) 40 MG/0.4ML injection Inject 0.4 mLs (40 mg total) into the skin daily for 14 days. 09/18/23 10/02/23  Awanda City, MD  famotidine  (PEPCID ) 20 MG tablet Take 1 tablet (20 mg total) by mouth daily as needed for heartburn or indigestion. 05/25/24   Tobie Arleta SQUIBB, MD  fluticasone  (FLONASE ) 50 MCG/ACT nasal spray Place 2 sprays into both nostrils daily as needed for allergies or rhinitis. 05/25/24   Tobie Arleta SQUIBB, MD  Fluticasone  Furoate (ARNUITY ELLIPTA ) 100 MCG/ACT AEPB Inhale 1 puff into the lungs daily. 05/25/24   Tobie Arleta SQUIBB, MD  furosemide  (LASIX ) 40 MG tablet Take 40 mg by mouth daily. 08/31/16   [provider]  gabapentin  (NEURONTIN ) 100 MG capsule Take 1 capsule (100 mg total) by mouth 3 (three) times daily for 14 days. 07/19/23 11/14/23  Keith, Kayla N, PA-C  HYDROcodone -acetaminophen  (NORCO/VICODIN) 5-325 MG tablet Take 1 tablet by mouth every 6 (six) hours as needed for moderate pain (pain score 4-6) or severe pain (pain score 7-10). Patient not taking: Reported on 11/25/2023 09/17/23   Awanda City, MD  LANTUS  SOLOSTAR 100 UNIT/ML Solostar Pen Inject 12 Units into the skin at bedtime.    [provider]  methocarbamol  (ROBAXIN ) 500 MG tablet Take 1 tablet (500 mg total) by mouth every 6 (six) hours as needed for muscle spasms. Patient not taking: Reported on 11/25/2023 09/17/23   Awanda City,  MD  omeprazole  (PRILOSEC ) 20 MG capsule Take 1 capsule (20 mg total) by mouth daily. 05/25/24   Tobie Arleta SQUIBB, MD  polyethylene glycol (MIRALAX  / GLYCOLAX ) 17 g packet Take 17 g by mouth daily as needed (constipation). 09/17/23   Awanda City, MD    Physical Exam: Vitals:   09/02/24 0204 09/02/24 0552 09/02/24 0930 09/02/24 0933  BP:  (!) 155/78 (!) 154/63   Pulse:  87 84   Resp:  12 16   Temp:  (!) 97.5 F (36.4 C)  (!) 96.7 F (35.9 C)  TempSrc:    Axillary  SpO2:  100% 100%   Weight: 56.2 kg     Height: 5' 2 (1.575 m)      General; frail, no acute distress, sleepy CVS; S1-S2 regular rhythm and rate Lungs; lungs normal respiratory effort, clear to auscultation Abdomen: Sounds present, soft nontender nondistended Neuro: She is sleepy, open eyes to voice, answer a few questions, hard of hearing. Extremities: No  edema  Data Reviewed:  Las reviewed.   Assessment and Plan: No notes have been filed under this hospital service. Service: Hospitalist   1-SBO: - Presents with abdominal pain, nausea vomiting.  CT scan concerning for SBO, chronic changes unclear if contained perforation or infection. - Due to leukocytosis and lactic acidosis plan to start IV Zosyn - NG tube to be placed - IV Protonix  twice daily - Appreciate general surgery evaluation - Continue with IV fluids  Lactic acidosis: - In the setting of hypovolemia, SBO, also concern for infection. - Continue with IV fluids - IV antibiotics  Leukocytosis: - In the setting of SBO and or concern for infection. - Added on IV antibiotics  Asthma: - As needed albuterol  - Start Pulmicort --hold fluticasone  inhaler  Diabetes type 2 - Hold Lantus  while NPO. - Scale insulin   Concern for CBD stone, dilation:  -Not significantly elevated Alk phosphatase.  -Plan to proceed with US >    4 mm left lower lobe pulmonary nodule. FU Out patient.    Advance Care Planning:   Code Status: Limited: Do not attempt  resuscitation (DNR) -DNR-LIMITED -Do Not Intubate/DNI with son over the phone  Consults: General Surgery  Family Communication: Son updated Severity of Illness: The appropriate patient status for this patient is INPATIENT. Inpatient status is judged to be reasonable and necessary in order to provide the required intensity of service to ensure the patient's safety. The patient's presenting symptoms, physical exam findings, and initial radiographic and laboratory data in the context of their chronic comorbidities is felt to place them at high risk for further clinical deterioration. Furthermore, it is not anticipated that the patient will be medically stable for discharge from the hospital within 2 midnights of admission.   * I certify that at the point of admission it is my clinical judgment that the patient will require inpatient hospital care spanning beyond 2 midnights from the point of admission due to high intensity of service, high risk for further deterioration and high frequency of surveillance required.*  Author: Owen DELENA Lore, MD 09/02/2024 12:22 PM  For on call review [email or web address].      [1]  Allergies Allergen Reactions   Jardiance [Empagliflozin] Other (See Comments)    Fatigue    Metformin And Related Other (See Comments)    Fatigue    Tradjenta [Linagliptin] Other (See Comments)    Unknown reaction   "

## 2024-09-02 NOTE — ED Triage Notes (Signed)
 Patient brought in by [facility] from [place] at [facility] for severe abdominal pain and nausea/vomiting x2 hours. Inially hypertensive with ems at 204/104. Repeat pressure 170/100.    CBG 185, HR 98

## 2024-09-02 NOTE — ED Notes (Signed)
 Two unsuccessful attempts to place NG tube. One by this RN and another RN

## 2024-09-02 NOTE — ED Notes (Signed)
 Fall precautions in place: wristband, sign on door, non-slip socks.

## 2024-09-02 NOTE — Progress Notes (Signed)
" °  Carryover admission to the Day Admitter.  I discussed this case with the EDP, Dr. Lorette.  Per these discussions:   This is a 89 year old female with history of prior small bowel striction's, who is being admitted with small bowel obstruction after presenting with abdominal discomfort nausea/vomiting, with CT abdomen/pelvis showing evidence of small bowel obstruction.  There is also question of potential stone in the distal common bile duct.  EDP discussed patient's case with on-call general surgery, Dr. Sebastian, who will consult, will also also make additional recommendations regarding the potential distal cbg stone stone.  NGT is not been placed.  Patient has been receiving as needed IV Dilaudid  as well as a dose of IV Zofran .  I have placed an order for inpatient to med/tele for further evaluation management of the above.  I have placed some additional preliminary admit orders via the adult multi-morbid admission order set. I have also ordered n.p.o., as needed IV Dilaudid , as needed IV Zofran .    Eva Pore, DO Hospitalist  "

## 2024-09-03 ENCOUNTER — Inpatient Hospital Stay (HOSPITAL_COMMUNITY)

## 2024-09-03 DIAGNOSIS — K56609 Unspecified intestinal obstruction, unspecified as to partial versus complete obstruction: Secondary | ICD-10-CM | POA: Diagnosis not present

## 2024-09-03 LAB — COMPREHENSIVE METABOLIC PANEL WITH GFR
ALT: 6 U/L (ref 0–44)
AST: 18 U/L (ref 15–41)
Albumin: 3.4 g/dL — ABNORMAL LOW (ref 3.5–5.0)
Alkaline Phosphatase: 116 U/L (ref 38–126)
Anion gap: 11 (ref 5–15)
BUN: 15 mg/dL (ref 8–23)
CO2: 26 mmol/L (ref 22–32)
Calcium: 9.1 mg/dL (ref 8.9–10.3)
Chloride: 107 mmol/L (ref 98–111)
Creatinine, Ser: 0.61 mg/dL (ref 0.44–1.00)
GFR, Estimated: >60 mL/min
Glucose, Bld: 106 mg/dL — ABNORMAL HIGH (ref 70–99)
Potassium: 3.4 mmol/L — ABNORMAL LOW (ref 3.5–5.1)
Sodium: 144 mmol/L (ref 135–145)
Total Bilirubin: 0.7 mg/dL (ref 0.0–1.2)
Total Protein: 6.2 g/dL — ABNORMAL LOW (ref 6.5–8.1)

## 2024-09-03 LAB — CBC
HCT: 41.3 % (ref 36.0–46.0)
Hemoglobin: 13.2 g/dL (ref 12.0–15.0)
MCH: 29.3 pg (ref 26.0–34.0)
MCHC: 32 g/dL (ref 30.0–36.0)
MCV: 91.8 fL (ref 80.0–100.0)
Platelets: 202 10*3/uL (ref 150–400)
RBC: 4.5 MIL/uL (ref 3.87–5.11)
RDW: 14.8 % (ref 11.5–15.5)
WBC: 8.6 10*3/uL (ref 4.0–10.5)
nRBC: 0 % (ref 0.0–0.2)

## 2024-09-03 LAB — MAGNESIUM: Magnesium: 1.9 mg/dL (ref 1.7–2.4)

## 2024-09-03 LAB — GLUCOSE, CAPILLARY
Glucose-Capillary: 114 mg/dL — ABNORMAL HIGH (ref 70–99)
Glucose-Capillary: 120 mg/dL — ABNORMAL HIGH (ref 70–99)
Glucose-Capillary: 97 mg/dL (ref 70–99)

## 2024-09-03 MED ORDER — LACTATED RINGERS IV SOLN: INTRAVENOUS | Status: DC

## 2024-09-03 MED ORDER — POTASSIUM CHLORIDE 10 MEQ/100ML IV SOLN
10.0000 meq | INTRAVENOUS | Status: DC
  Filled 2024-09-03: qty 100

## 2024-09-03 MED ORDER — POTASSIUM CHLORIDE 2 MEQ/ML IV SOLN
INTRAVENOUS | Status: DC
  Filled 2024-09-03 (×2): qty 1000

## 2024-09-03 MED ORDER — POTASSIUM CHLORIDE CRYS ER 20 MEQ PO TBCR
40.0000 meq | EXTENDED_RELEASE_TABLET | Freq: Once | ORAL | Status: AC
  Administered 2024-09-03: 40 meq via ORAL
  Filled 2024-09-03: qty 2

## 2024-09-03 NOTE — Plan of Care (Signed)
   Problem: Coping: Goal: Level of anxiety will decrease Outcome: Progressing   Problem: Pain Managment: Goal: General experience of comfort will improve and/or be controlled Outcome: Progressing   Problem: Safety: Goal: Ability to remain free from injury will improve Outcome: Progressing

## 2024-09-03 NOTE — Progress Notes (Addendum)
 "    Subjective/Chief Complaint: Patient reports no abdominal pain No recorded bowel movements NG 700 cc output  Plain films - contrast extends all the way to rectum.  Objective: Vital signs in last 24 hours: Temp:  [96.7 F (35.9 C)-97.8 F (36.6 C)] 97.7 F (36.5 C) (01/24 0550) Pulse Rate:  [80-98] 80 (01/24 0550) Resp:  [16-17] 16 (01/23 1724) BP: (154-169)/(63-76) 155/63 (01/24 0550) SpO2:  [99 %-100 %] 99 % (01/24 0550) Weight:  [53.2 kg] 53.2 kg (01/24 0500)    Intake/Output from previous day: 01/23 0701 - 01/24 0700 In: 2086.3 [I.V.:1951.6; IV Piggyback:134.7] Out: 700 [Emesis/NG output:700] Intake/Output this shift: No intake/output data recorded.  Elderly female in NAD Abd - soft, non-distended; non-tender; firm abdominal wall lesion right of umbilicus  Lab Results:  Recent Labs    09/02/24 0222  WBC 15.6*  HGB 14.6  HCT 44.5  PLT 289   BMET Recent Labs    09/02/24 0222  NA 138  K 3.9  CL 101  CO2 19*  GLUCOSE 180*  BUN 16  CREATININE 0.71  CALCIUM  10.4*      Latest Ref Rng & Units 09/02/2024    2:22 AM 11/10/2022    3:05 AM 11/07/2022    4:19 AM  Hepatic Function  Total Protein 6.5 - 8.1 g/dL 8.0  4.9  5.6   Albumin  3.5 - 5.0 g/dL 4.4  2.2  2.8   AST 15 - 41 U/L 22  14  12    ALT 0 - 44 U/L 10  12  12    Alk Phosphatase 38 - 126 U/L 155  57  69   Total Bilirubin 0.0 - 1.2 mg/dL 0.6  0.5  0.9      Studies/Results: DG Abd Portable 1V-Small Bowel Obstruction Protocol-initial, 8 hr delay Result Date: 09/03/2024 EXAM: 1 VIEW XRAY OF THE ABDOMEN 09/03/2024 03:00:00 AM COMPARISON: 09/02/2024 CLINICAL HISTORY: FINDINGS: LINES, TUBES AND DEVICES: Enteric tube in gastric body. BOWEL: Administered oral contrast opacifies numerous loops of nondistended small bowel, and fills the nondilated colon extending into the rectosigmoid colon. The findings are in keeping with a resolved small bowel obstruction. No free intraperitoneal gas. SOFT TISSUES: Surgical  clips in right upper quadrant, consistent with prior cholecystectomy. No abnormal calcifications. BONES: Postoperative changes in lower lumbar spine. No acute fracture. IMPRESSION: 1. Findings consistent with resolved small bowel obstruction. 2. Enteric tube terminates in the gastric body. 3. Prior cholecystectomy and postoperative changes in the lower lumbar spine. Electronically signed by: Dorethia Molt MD 09/03/2024 03:52 AM EST RP Workstation: [id number]   US  Abdomen Limited RUQ (LIVER/GB) Result Date: 09/02/2024 CLINICAL DATA:  History of prior cholecystectomy sent to evaluate for choledocholithiasis. EXAM: ULTRASOUND ABDOMEN LIMITED RIGHT UPPER QUADRANT COMPARISON:  None Available. FINDINGS: Gallbladder: The gallbladder is surgically absent. Common bile duct: Diameter: 7.98 mm (9.87 mm distally) Liver: No focal lesion identified. There is evidence of intrahepatic biliary dilatation. Diffusely decreased echogenicity of the liver parenchyma is noted. Portal vein is patent on color Doppler imaging with normal direction of blood flow towards the liver. Other: The study is technically limited secondary to the patient's body habitus and inability to follow commands during the examination, as per the ultrasound technologist. IMPRESSION: 1. Findings consistent with prior cholecystectomy. 2. Dilated distal common bile duct without visualization of obstructing gallstones. Further evaluation with MRCP is with if choledocholithiasis remains of clinical concern. Electronically Signed   By: Suzen Dials M.D.   On: 09/02/2024 10:59   DG  Abd 1 View Result Date: 09/02/2024 CLINICAL DATA:  Nasogastric tube placement EXAM: ABDOMEN - 1 VIEW COMPARISON:  September 30, 2023 FINDINGS: Distal tip of nasogastric tube is seen in proximal stomach. IMPRESSION: Distal tip of nasogastric tube is seen in proximal stomach. Electronically Signed   By: Lynwood Landy Raddle M.D.   On: 09/02/2024 10:57   CT Angio Abd/Pel W and/or Wo  Contrast Result Date: 09/02/2024 CLINICAL DATA:  Mesenteric ischemia. EXAM: CTA ABDOMEN AND PELVIS WITHOUT AND WITH CONTRAST TECHNIQUE: Multidetector CT imaging of the abdomen and pelvis was performed using the standard protocol during bolus administration of intravenous contrast. Multiplanar reconstructed images and MIPs were obtained and reviewed to evaluate the vascular anatomy. RADIATION DOSE REDUCTION: This exam was performed according to the departmental dose-optimization program which includes automated exposure control, adjustment of the mA and/or kV according to patient size and/or use of iterative reconstruction technique. CONTRAST:  75mL OMNIPAQUE  IOHEXOL  350 MG/ML SOLN COMPARISON:  Outside abdomen and pelvis CT 09/30/2023. FINDINGS: VASCULAR Aorta: Normal caliber aorta without aneurysm, dissection, vasculitis or significant stenosis. Advanced atherosclerotic disease. Abdominal aorta is obscured in some areas by motion artifact from spinal fixation hardware. Celiac: Patent without evidence of aneurysm, dissection, vasculitis or significant stenosis. Atherosclerosis. SMA: Patent without evidence of aneurysm, dissection, vasculitis or significant stenosis. Atherosclerosis. Renals: Both renal arteries patent with moderate to advanced atherosclerotic calcified plaque in the ostial region bilaterally. IMA: Obscured by beam hardening artifact proximally. Inflow: Patent without evidence of aneurysm, dissection, vasculitis or significant stenosis. Proximal Outflow: Atherosclerotic disease. Veins: No obvious venous abnormality within the limitations of this arterial phase study. Review of the MIP images confirms the above findings. NON-VASCULAR Lower chest: 4 mm left lower lobe pulmonary nodule on 23/8. Hepatobiliary: No suspicious focal abnormality within the liver parenchyma. A tiny hypodensity in the liver parenchyma is too small to characterize but is statistically most likely benign. No followup imaging is  recommended. Gallbladder is surgically absent. Mild intrahepatic biliary duct prominence. Common bile duct measures up to 8 mm diameter. 9 mm calcification is identified in the region of the ampulla and appears to be in line with the common bile duct (see axial images 19 and 20 of series 2) raising concern for distal common bile duct stone. Pancreas: Pancreas is diffusely atrophic with diffuse dilatation of the main pancreatic duct. See paragraph above. Spleen: No splenomegaly. No suspicious focal mass lesion. Adrenals/Urinary Tract: No adrenal nodule or mass. Right kidney unremarkable. 4.9 cm exophytic cyst upper pole left kidney has a single thin internal septation compatible with Bosniak II category. Immediately adjacent Bosniak I 2.4 cm left renal cyst. No evidence for hydroureter. Bladder is distended. Stomach/Bowel: Stomach is distended with food fluid and gas. Duodenum is normally positioned as is the ligament of Treitz. Dilated small bowel loops in the abdomen and upper pelvis measure up to 3.4 cm diameter. The most dilated loops show ill-defined walls, subtle perienteric edema in fecalization of enteric contents. No abrupt or discrete transition zone can be identified although the small bowel appears to be tethered anteriorly up against the peritoneum just deep to the rectus sheath where there is ill-defined amorphous soft tissue in central gas evident (see coronal images 102-105 of series 11). In retrospect, previous outside CT from 09/30/2023 showed soft tissue thickening and probable granulation inter just deep to the midline rectus sheath with apparent intraluminal containing gas collection. Small bowel in the right lower quadrant appears relatively decompressed. Neither the terminal ileum nor the appendix are well  visualized. Colonic diverticulosis evident without definite features of diverticulitis. Lymphatic: No abdominal lymphadenopathy. No pelvic sidewall lymphadenopathy. Reproductive: There is no  adnexal mass. Other: No substantial intraperitoneal free fluid. Musculoskeletal: Bones are diffusely demineralized. Posterior spinal fusion hardware noted L3-4. IMPRESSION: 1. No evidence for abdominal aortic aneurysm or dissection. No evidence for mesenteric arterial occlusion. 2. Dilated small bowel loops in the abdomen and upper pelvis measure up to 3.4 cm diameter. The most dilated loops show ill-defined walls, subtle perienteric edema and fecalization of enteric contents. No abrupt or discrete transition zone can be identified although the small bowel appears to be tethered anteriorly up against the peritoneum just deep to the rectus sheath where there is ill-defined amorphous soft tissue with central gas evident that appears to be extraluminal but contain/loculated within the soft tissue. Previous outside CT from 09/30/2023 showed soft tissue thickening and probable granulation just deep to the midline rectus sheath with apparent extraluminal gas collection. Imaging features are compatible with small bowel obstruction, likely secondary to adhesions and/or desmoplastic/neoplastic process in the anterior upper pelvis incorporating small bowel loops. Chronic contained perforation or neoplasm could have this appearance. 3. 9 mm calcification identified in the region of the ampulla and appears to be in line with the dilated common bile duct raising concern for distal common bile duct stone. Pancreatic parenchyma is diffusely atrophic around the dilated duct. Mild intrahepatic biliary duct prominence with common bile duct measuring up to 8 mm diameter. 4. 4 mm left lower lobe pulmonary nodule. No follow-up needed if patient is low-risk.This recommendation follows the consensus statement: Guidelines for Management of Incidental Pulmonary Nodules Detected on CT Images: From the Fleischner Society 2017; Radiology 2017; 284:228-243. 5. Colonic diverticulosis without diverticulitis. Electronically Signed   By: Camellia Candle M.D.   On: 09/02/2024 05:53    Anti-infectives: Anti-infectives (From admission, onward)    Start     Dose/Rate Route Frequency Ordered Stop   09/02/24 0845  piperacillin -tazobactam (ZOSYN ) IVPB 3.375 g        3.375 g 12.5 mL/hr over 240 Minutes Intravenous Every 8 hours 09/02/24 9166         Assessment/Plan: SBO, likely adhesive due to history of previous abdominal surgeries  SBO appears to be resolved Clamp NG tube/ clear liquids If she tolerates this well, may remove NG tube this afternoon or tomorrow. No surgical indication for ongoing antibiotics  Dilated CBD on imaging - s/p remote cholecystectomy.  Normal LFT's.  No need for further work-up.   LOS: 1 day    Donnice MARLA Lima 09/03/2024  "

## 2024-09-03 NOTE — Progress Notes (Addendum)
 " PROGRESS NOTE    Rhonda Watkins  FMW:[id number] DOB: 03-09-1932 DOA: 09/02/2024 PCP: Charlott Dorn LABOR, MD   Brief Narrative: 89 year old with past medical history significant for diabetes, hypertension, hyperlipidemia, asthma, GERD, prior history of SBO status post exploratory laparotomy lysis of adhesion for 90 minutes 10/2022, previous cholecystectomy, colon surgery, hernia repair, presents complaining of abdominal pain nausea vomiting.  CT imaging concerning for SBO. General surgery consulted and following.  Patient was started on a small bowel protocol.   Assessment & Plan:   Principal Problem:   SBO (small bowel obstruction) (HCC)  1-SBO: - Presents with abdominal pain, nausea vomiting.  CT scan concerning for SBO, chronic changes unclear if contained perforation or infection. - NG tube to be placed - IV Protonix  twice daily - Appreciate general surgery evaluation - Continue with IV fluids Start patient on clear.  KUB looks better.  Discontinue IV antibiotics, per Surgery recommendations.   Lactic acidosis: - In the setting of hypovolemia, SBO, also concern for infection. - Continue with IV fluids Resolved.    Leukocytosis: - In the setting of SBO and or concern for infection. - Trending down.    Asthma: - As needed albuterol  - Continue with  Pulmicort --hold fluticasone  inhaler   Diabetes type 2 - Hold Lantus  while NPO. - Scale insulin    Concern for CBD stone, dilation:  -Not significantly elevated Alk phosphatase.  -US : Dilated distal common bile duct without visualization of obstructing gallstone. Per general surgery no need to proceed with MRCP, liver function test trending down  Hypokalemia; replete IV   4 mm left lower lobe pulmonary nodule. FU Out patient.        Estimated body mass index is 21.45 kg/m as calculated from the following:   Height as of this encounter: 5' 2 (1.575 m).   Weight as of this encounter: 53.2 kg.   DVT  prophylaxis: Lovenox  Code Status: DNR Family Communication: Son updated 1/23rd Disposition Plan:  Status is: Inpatient Remains inpatient appropriate because: Management of SBO    Consultants:  General surgery  Procedures:  NG tube  Antimicrobials:    Subjective: She reports only 2 episodes of abdominal pain today.  She does not think that she has passed any gas.  No bowel movement reported.,  Surgery recommending a start clears and see how she does  Objective: Vitals:   09/02/24 1347 09/02/24 1724 09/03/24 0500 09/03/24 0550  BP: (!) 169/76 (!) 162/73  (!) 155/63  Pulse: 98 86  80  Resp:  16    Temp: (!) 97.5 F (36.4 C) 97.8 F (36.6 C)  97.7 F (36.5 C)  TempSrc: Oral Axillary  Oral  SpO2: 100% 100%  99%  Weight:   53.2 kg   Height:        Intake/Output Summary (Last 24 hours) at 09/03/2024 0839 Last data filed at 09/03/2024 9375 Gross per 24 hour  Intake 2086.31 ml  Output 700 ml  Net 1386.31 ml   Filed Weights   09/02/24 0204 09/03/24 0500  Weight: 56.2 kg 53.2 kg    Examination:  General exam: Appears calm and comfortable  Respiratory system: Clear to auscultation. Respiratory effort normal. Cardiovascular system: S1 & S2 heard, RRR. No JVD, murmurs, rubs, gallops or clicks. No pedal edema. Gastrointestinal system: Abdomen is nondistended, soft and nontender Central nervous system: Alert and oriented.  Extremities: Symmetric 5 x 5 power.   Data Reviewed: I have personally reviewed following labs and imaging studies  CBC: Recent Labs  Lab 09/02/24 0222  WBC 15.6*  NEUTROABS 12.0*  HGB 14.6  HCT 44.5  MCV 90.1  PLT 289   Basic Metabolic Panel: Recent Labs  Lab 09/02/24 0222  NA 138  K 3.9  CL 101  CO2 19*  GLUCOSE 180*  BUN 16  CREATININE 0.71  CALCIUM  10.4*   GFR: Estimated Creatinine Clearance: 37 mL/min (by C-G formula based on SCr of 0.71 mg/dL). Liver Function Tests: Recent Labs  Lab 09/02/24 0222  AST 22  ALT 10   ALKPHOS 155*  BILITOT 0.6  PROT 8.0  ALBUMIN  4.4   Recent Labs  Lab 09/02/24 0222  LIPASE 11   No results for input(s): AMMONIA in the last 168 hours. Coagulation Profile: No results for input(s): INR, PROTIME in the last 168 hours. Cardiac Enzymes: No results for input(s): CKTOTAL, CKMB, CKMBINDEX, TROPONINI in the last 168 hours. BNP (last 3 results) No results for input(s): PROBNP in the last 8760 hours. HbA1C: Recent Labs    09/02/24 1431  HGBA1C 7.0*   CBG: Recent Labs  Lab 09/02/24 1627 09/02/24 1801 09/02/24 2343 09/03/24 0615  GLUCAP 171* 148* 178* 97   Lipid Profile: No results for input(s): CHOL, HDL, LDLCALC, TRIG, CHOLHDL, LDLDIRECT in the last 72 hours. Thyroid  Function Tests: No results for input(s): TSH, T4TOTAL, FREET4, T3FREE, THYROIDAB in the last 72 hours. Anemia Panel: No results for input(s): VITAMINB12, FOLATE, FERRITIN, TIBC, IRON, RETICCTPCT in the last 72 hours. Sepsis Labs: Recent Labs  Lab 09/02/24 0316 09/02/24 0555  LATICACIDVEN 2.7* 1.6    No results found for this or any previous visit (from the past 240 hours).       Radiology Studies: DG Abd Portable 1V-Small Bowel Obstruction Protocol-initial, 8 hr delay Result Date: 09/03/2024 EXAM: 1 VIEW XRAY OF THE ABDOMEN 09/03/2024 03:00:00 AM COMPARISON: 09/02/2024 CLINICAL HISTORY: FINDINGS: LINES, TUBES AND DEVICES: Enteric tube in gastric body. BOWEL: Administered oral contrast opacifies numerous loops of nondistended small bowel, and fills the nondilated colon extending into the rectosigmoid colon. The findings are in keeping with a resolved small bowel obstruction. No free intraperitoneal gas. SOFT TISSUES: Surgical clips in right upper quadrant, consistent with prior cholecystectomy. No abnormal calcifications. BONES: Postoperative changes in lower lumbar spine. No acute fracture. IMPRESSION: 1. Findings consistent with resolved  small bowel obstruction. 2. Enteric tube terminates in the gastric body. 3. Prior cholecystectomy and postoperative changes in the lower lumbar spine. Electronically signed by: Dorethia Molt MD 09/03/2024 03:52 AM EST RP Workstation: [id number]   US  Abdomen Limited RUQ (LIVER/GB) Result Date: 09/02/2024 CLINICAL DATA:  History of prior cholecystectomy sent to evaluate for choledocholithiasis. EXAM: ULTRASOUND ABDOMEN LIMITED RIGHT UPPER QUADRANT COMPARISON:  None Available. FINDINGS: Gallbladder: The gallbladder is surgically absent. Common bile duct: Diameter: 7.98 mm (9.87 mm distally) Liver: No focal lesion identified. There is evidence of intrahepatic biliary dilatation. Diffusely decreased echogenicity of the liver parenchyma is noted. Portal vein is patent on color Doppler imaging with normal direction of blood flow towards the liver. Other: The study is technically limited secondary to the patient's body habitus and inability to follow commands during the examination, as per the ultrasound technologist. IMPRESSION: 1. Findings consistent with prior cholecystectomy. 2. Dilated distal common bile duct without visualization of obstructing gallstones. Further evaluation with MRCP is with if choledocholithiasis remains of clinical concern. Electronically Signed   By: Suzen Dials M.D.   On: 09/02/2024 10:59   DG Abd 1 View Result Date: 09/02/2024 CLINICAL DATA:  Nasogastric tube  placement EXAM: ABDOMEN - 1 VIEW COMPARISON:  September 30, 2023 FINDINGS: Distal tip of nasogastric tube is seen in proximal stomach. IMPRESSION: Distal tip of nasogastric tube is seen in proximal stomach. Electronically Signed   By: Lynwood Landy Raddle M.D.   On: 09/02/2024 10:57   CT Angio Abd/Pel W and/or Wo Contrast Result Date: 09/02/2024 CLINICAL DATA:  Mesenteric ischemia. EXAM: CTA ABDOMEN AND PELVIS WITHOUT AND WITH CONTRAST TECHNIQUE: Multidetector CT imaging of the abdomen and pelvis was performed using the standard  protocol during bolus administration of intravenous contrast. Multiplanar reconstructed images and MIPs were obtained and reviewed to evaluate the vascular anatomy. RADIATION DOSE REDUCTION: This exam was performed according to the departmental dose-optimization program which includes automated exposure control, adjustment of the mA and/or kV according to patient size and/or use of iterative reconstruction technique. CONTRAST:  75mL OMNIPAQUE  IOHEXOL  350 MG/ML SOLN COMPARISON:  Outside abdomen and pelvis CT 09/30/2023. FINDINGS: VASCULAR Aorta: Normal caliber aorta without aneurysm, dissection, vasculitis or significant stenosis. Advanced atherosclerotic disease. Abdominal aorta is obscured in some areas by motion artifact from spinal fixation hardware. Celiac: Patent without evidence of aneurysm, dissection, vasculitis or significant stenosis. Atherosclerosis. SMA: Patent without evidence of aneurysm, dissection, vasculitis or significant stenosis. Atherosclerosis. Renals: Both renal arteries patent with moderate to advanced atherosclerotic calcified plaque in the ostial region bilaterally. IMA: Obscured by beam hardening artifact proximally. Inflow: Patent without evidence of aneurysm, dissection, vasculitis or significant stenosis. Proximal Outflow: Atherosclerotic disease. Veins: No obvious venous abnormality within the limitations of this arterial phase study. Review of the MIP images confirms the above findings. NON-VASCULAR Lower chest: 4 mm left lower lobe pulmonary nodule on 23/8. Hepatobiliary: No suspicious focal abnormality within the liver parenchyma. A tiny hypodensity in the liver parenchyma is too small to characterize but is statistically most likely benign. No followup imaging is recommended. Gallbladder is surgically absent. Mild intrahepatic biliary duct prominence. Common bile duct measures up to 8 mm diameter. 9 mm calcification is identified in the region of the ampulla and appears to be in  line with the common bile duct (see axial images 19 and 20 of series 2) raising concern for distal common bile duct stone. Pancreas: Pancreas is diffusely atrophic with diffuse dilatation of the main pancreatic duct. See paragraph above. Spleen: No splenomegaly. No suspicious focal mass lesion. Adrenals/Urinary Tract: No adrenal nodule or mass. Right kidney unremarkable. 4.9 cm exophytic cyst upper pole left kidney has a single thin internal septation compatible with Bosniak II category. Immediately adjacent Bosniak I 2.4 cm left renal cyst. No evidence for hydroureter. Bladder is distended. Stomach/Bowel: Stomach is distended with food fluid and gas. Duodenum is normally positioned as is the ligament of Treitz. Dilated small bowel loops in the abdomen and upper pelvis measure up to 3.4 cm diameter. The most dilated loops show ill-defined walls, subtle perienteric edema in fecalization of enteric contents. No abrupt or discrete transition zone can be identified although the small bowel appears to be tethered anteriorly up against the peritoneum just deep to the rectus sheath where there is ill-defined amorphous soft tissue in central gas evident (see coronal images 102-105 of series 11). In retrospect, previous outside CT from 09/30/2023 showed soft tissue thickening and probable granulation inter just deep to the midline rectus sheath with apparent intraluminal containing gas collection. Small bowel in the right lower quadrant appears relatively decompressed. Neither the terminal ileum nor the appendix are well visualized. Colonic diverticulosis evident without definite features of diverticulitis. Lymphatic: No  abdominal lymphadenopathy. No pelvic sidewall lymphadenopathy. Reproductive: There is no adnexal mass. Other: No substantial intraperitoneal free fluid. Musculoskeletal: Bones are diffusely demineralized. Posterior spinal fusion hardware noted L3-4. IMPRESSION: 1. No evidence for abdominal aortic aneurysm or  dissection. No evidence for mesenteric arterial occlusion. 2. Dilated small bowel loops in the abdomen and upper pelvis measure up to 3.4 cm diameter. The most dilated loops show ill-defined walls, subtle perienteric edema and fecalization of enteric contents. No abrupt or discrete transition zone can be identified although the small bowel appears to be tethered anteriorly up against the peritoneum just deep to the rectus sheath where there is ill-defined amorphous soft tissue with central gas evident that appears to be extraluminal but contain/loculated within the soft tissue. Previous outside CT from 09/30/2023 showed soft tissue thickening and probable granulation just deep to the midline rectus sheath with apparent extraluminal gas collection. Imaging features are compatible with small bowel obstruction, likely secondary to adhesions and/or desmoplastic/neoplastic process in the anterior upper pelvis incorporating small bowel loops. Chronic contained perforation or neoplasm could have this appearance. 3. 9 mm calcification identified in the region of the ampulla and appears to be in line with the dilated common bile duct raising concern for distal common bile duct stone. Pancreatic parenchyma is diffusely atrophic around the dilated duct. Mild intrahepatic biliary duct prominence with common bile duct measuring up to 8 mm diameter. 4. 4 mm left lower lobe pulmonary nodule. No follow-up needed if patient is low-risk.This recommendation follows the consensus statement: Guidelines for Management of Incidental Pulmonary Nodules Detected on CT Images: From the Fleischner Society 2017; Radiology 2017; 284:228-243. 5. Colonic diverticulosis without diverticulitis. Electronically Signed   By: Camellia Candle M.D.   On: 09/02/2024 05:53        Scheduled Meds:  budesonide  (PULMICORT ) nebulizer solution  0.25 mg Nebulization BID   enoxaparin  (LOVENOX ) injection  40 mg Subcutaneous QHS   insulin  aspart  0-6 Units  Subcutaneous Q6H   pantoprazole  (PROTONIX ) IV  40 mg Intravenous Q12H   Continuous Infusions:  lactated ringers      piperacillin -tazobactam (ZOSYN )  IV 3.375 g (09/03/24 0623)     LOS: 1 day    Time spent: 35 Minutes    Meryem Haertel A Jireh Vinas, MD [facility]   If 7PM-7AM, please contact night-coverage [email or web address]  09/03/2024, 8:39 AM   "

## 2024-09-04 LAB — GLUCOSE, CAPILLARY
Glucose-Capillary: 114 mg/dL — ABNORMAL HIGH (ref 70–99)
Glucose-Capillary: 153 mg/dL — ABNORMAL HIGH (ref 70–99)
Glucose-Capillary: 168 mg/dL — ABNORMAL HIGH (ref 70–99)
Glucose-Capillary: 139 mg/dL — ABNORMAL HIGH (ref 70–99)
Glucose-Capillary: 184 mg/dL — ABNORMAL HIGH (ref 70–99)

## 2024-09-04 LAB — CBC
HCT: 36.3 % (ref 36.0–46.0)
Hemoglobin: 11.8 g/dL — ABNORMAL LOW (ref 12.0–15.0)
MCH: 29.5 pg (ref 26.0–34.0)
MCHC: 32.5 g/dL (ref 30.0–36.0)
MCV: 90.8 fL (ref 80.0–100.0)
Platelets: 179 10*3/uL (ref 150–400)
RBC: 4 MIL/uL (ref 3.87–5.11)
RDW: 14.6 % (ref 11.5–15.5)
WBC: 8.4 10*3/uL (ref 4.0–10.5)
nRBC: 0 % (ref 0.0–0.2)

## 2024-09-04 LAB — BASIC METABOLIC PANEL WITH GFR
Anion gap: 11 (ref 5–15)
BUN: 11 mg/dL (ref 8–23)
CO2: 25 mmol/L (ref 22–32)
Calcium: 8.7 mg/dL — ABNORMAL LOW (ref 8.9–10.3)
Chloride: 105 mmol/L (ref 98–111)
Creatinine, Ser: 0.54 mg/dL (ref 0.44–1.00)
GFR, Estimated: >60 mL/min
Glucose, Bld: 124 mg/dL — ABNORMAL HIGH (ref 70–99)
Potassium: 3.4 mmol/L — ABNORMAL LOW (ref 3.5–5.1)
Sodium: 141 mmol/L (ref 135–145)

## 2024-09-04 MED ORDER — POTASSIUM CHLORIDE CRYS ER 20 MEQ PO TBCR
40.0000 meq | EXTENDED_RELEASE_TABLET | Freq: Once | ORAL | Status: AC
  Administered 2024-09-04: 40 meq via ORAL
  Filled 2024-09-04: qty 2

## 2024-09-04 MED ORDER — LIDOCAINE 5 % EX PTCH
1.0000 | MEDICATED_PATCH | CUTANEOUS | Status: DC
  Administered 2024-09-04 – 2024-09-07 (×4): 1 via TRANSDERMAL
  Filled 2024-09-04 (×4): qty 1

## 2024-09-04 MED ORDER — DOCUSATE SODIUM 100 MG PO CAPS
100.0000 mg | ORAL_CAPSULE | Freq: Two times a day (BID) | ORAL | Status: DC
  Administered 2024-09-04 – 2024-09-06 (×5): 100 mg via ORAL
  Filled 2024-09-04 (×7): qty 1

## 2024-09-04 NOTE — Progress Notes (Signed)
 Physical Therapy Treatment Patient Details Name: Rhonda Watkins MRN: [id number] DOB: 11-Feb-1932 Today's Date: 09/04/2024   History of Present Illness 89 y.o. female [facility]24 with abdominal pain and emesis. Imaging notable for SBO. PMH includes DM, HTN, HLD, asthma, GERD, cholecystectomy.    PT Comments  PT returned to room for discussion of discharge recommendations after confirming pt history with family. Pt expresses the need to have a bowel movement at this time. PT assists the pt in further transfer training, pt has a posterior loss of balance when standing to perform hygiene tasks. Pt remains at a high risk for falls due to balance, endurance and cognitive deficits. Patient will benefit from continued inpatient follow up therapy, <3 hours/day.    If plan is discharge home, recommend the following: A little help with walking and/or transfers;A lot of help with bathing/dressing/bathroom;Assistance with cooking/housework;Direct supervision/assist for medications management;Direct supervision/assist for financial management;Assist for transportation;Supervision due to cognitive status   Can travel by private vehicle     Yes  Equipment Recommendations  None recommended by PT    Recommendations for Other Services       Precautions / Restrictions Precautions Precautions: Fall Recall of Precautions/Restrictions: Impaired Restrictions Weight Bearing Restrictions Per Provider Order: No     Mobility  Bed Mobility Overal bed mobility: Needs Assistance Bed Mobility: Supine to Sit     Supine to sit: Supervision, HOB elevated          Transfers Overall transfer level: Needs assistance Equipment used: Rolling walker (2 wheels) Transfers: Sit to/from Stand, Bed to chair/wheelchair/BSC Sit to Stand: Min assist   Step pivot transfers: Min assist            Ambulation/Gait Ambulation/Gait assistance: Min assist Gait Distance (Feet): 5  Feet Assistive device: Rolling walker (2 wheels) Gait Pattern/deviations: Step-to pattern, Trunk flexed Gait velocity: reduced Gait velocity interpretation: <1.31 ft/sec, indicative of household ambulator   General Gait Details: short shuffling steps from bed to recliner   Stairs             Wheelchair Mobility     Tilt Bed    Modified Rankin (Stroke Patients Only)       Balance Overall balance assessment: Needs assistance Sitting-balance support: No upper extremity supported, Feet supported Sitting balance-Leahy Scale: Fair     Standing balance support: Reliant on assistive device for balance, Single extremity supported Standing balance-Leahy Scale: Poor Standing balance comment: minA for posterior loss of balance during hygiene tasks                            Communication Communication Communication: Impaired Factors Affecting Communication: Hearing impaired (hearing aide is present but battery is dead)  Cognition Arousal: Alert Behavior During Therapy: Anxious   PT - Cognitive impairments: Awareness, Memory, Attention, Problem solving, Safety/Judgement                       PT - Cognition Comments: son reports pt has memory deficits at baseline with very poor short term memory Following commands: Intact      Cueing Cueing Techniques: Verbal cues, Visual cues, Tactile cues  Exercises      General Comments General comments (skin integrity, edema, etc.): VSS on RA, pt is incontinent of stool      Pertinent Vitals/Pain Pain Assessment Pain Assessment: No/denies pain Faces Pain Scale: Hurts little more Pain Location: L hip Pain Descriptors /  Indicators: Sore Pain Intervention(s): Monitored during session    Home Living Family/patient expects to be discharged to:: Other (Comment)                   Additional Comments: [facility]    Prior Function            PT Goals (current goals can now be found in the care  plan section) Acute Rehab PT Goals Patient Stated Goal: to reduce falls risk PT Goal Formulation: With patient Time For Goal Achievement: 09/18/24 Potential to Achieve Goals: Fair Additional Goals Additional Goal #1: Pt will mobilize in a manual wheelchair at a modI level for 100' to demonstrates independence in household mobility Progress towards PT goals: Progressing toward goals    Frequency    Min 2X/week      PT Plan      Co-evaluation              AM-PAC PT 6 Clicks Mobility   Outcome Measure  Help needed turning from your back to your side while in a flat bed without using bedrails?: A Little Help needed moving from lying on your back to sitting on the side of a flat bed without using bedrails?: A Little Help needed moving to and from a bed to a chair (including a wheelchair)?: A Little Help needed standing up from a chair using your arms (e.g., wheelchair or bedside chair)?: A Little Help needed to walk in hospital room?: Total Help needed climbing 3-5 steps with a railing? : Total 6 Click Score: 14    End of Session Equipment Utilized During Treatment: Gait belt Activity Tolerance: Patient tolerated treatment well Patient left: in chair;with call bell/phone within reach;with chair alarm set Nurse Communication: Mobility status PT Visit Diagnosis: Other abnormalities of gait and mobility (R26.89);Muscle weakness (generalized) (M62.81)     Time: 1201-1220 PT Time Calculation (min) (ACUTE ONLY): 19 min  Charges:    $Therapeutic Activity: 8-22 mins PT General Charges $$ ACUTE PT VISIT: 1 Visit                     Bernardino JINNY Ruth, PT, DPT Acute Rehabilitation Office [phone]    Bernardino JINNY Ruth 09/04/2024, 12:34 PM

## 2024-09-04 NOTE — Evaluation (Signed)
 Occupational Therapy Evaluation Patient Details Name: Rhonda Watkins MRN: [id number] DOB: Jun 17, 1932 Today's Date: 09/04/2024   History of Present Illness   89 y.o. female presents to [facility] on 09/02/2024 with abdominal pain and emesis. Imaging notable for SBO. PMH includes DM, HTN, HLD, asthma, GERD, cholecystectomy.     Clinical Impressions Pt admitted based on above, and was seen based on problem list below. PTA pt was living at ILF, per family via phone call, pt was mod I for ADLs at w/c level d/t high fall risk. Today pt is requiring set up  to total assist for ADLs. Functional transfers are  min  assist with RW. Noted cog deficits in memory, attention, problem solving, and judgement. Pt limited by decreased cog, strength, balance, and activity tolerance. D/t cog deficits, pt will need 24/7 supervision d/t high fall risk. If family unable to provide 24/7 supervision at d/c, then recommending <3 hours of skilled rehab daily. OT will continue to follow acutely to maximize functional independence.     If plan is discharge home, recommend the following:   A lot of help with walking and/or transfers;A lot of help with bathing/dressing/bathroom;Assistance with cooking/housework;Assist for transportation;Supervision due to cognitive status      Equipment Recommendations   Other (comment) (Defer to next venue)      Precautions/Restrictions   Precautions Precautions: Fall Recall of Precautions/Restrictions: Impaired Restrictions Weight Bearing Restrictions Per Provider Order: No     Mobility Bed Mobility Overal bed mobility: Needs Assistance Bed Mobility: Supine to Sit     Supine to sit: Supervision, HOB elevated     General bed mobility comments: HOB elevated, increased time    Transfers Overall transfer level: Needs assistance Equipment used: Rolling walker (2 wheels) Transfers: Sit to/from Stand, Bed to chair/wheelchair/BSC Sit to Stand: Min assist      Step pivot transfers: Min assist     General transfer comment: Min assist to boost, and manage RW      Balance Overall balance assessment: Needs assistance Sitting-balance support: No upper extremity supported, Feet supported Sitting balance-Leahy Scale: Good     Standing balance support: Single extremity supported, Reliant on assistive device for balance Standing balance-Leahy Scale: Poor Standing balance comment: reliant on RW       ADL either performed or assessed with clinical judgement   ADL Overall ADL's : Needs assistance/impaired Eating/Feeding: Set up;Sitting   Grooming: Set up;Sitting   Upper Body Bathing: Set up;Sitting   Lower Body Bathing: Moderate assistance;Sit to/from stand   Upper Body Dressing : Set up;Sitting   Lower Body Dressing: Moderate assistance;Sit to/from stand   Toilet Transfer: Minimal assistance;Rolling walker (2 wheels);BSC/3in1 Toilet Transfer Details (indicate cue type and reason): Step pivot Toileting- Clothing Manipulation and Hygiene: Sit to/from stand;Total assistance;Contact guard assist Toileting - Clothing Manipulation Details (indicate cue type and reason): Total assist d/t incontinence in standing, CGA for pericare     Functional mobility during ADLs: Minimal assistance;Rolling walker (2 wheels)       Vision Baseline Vision/History: 1 Wears glasses Vision Assessment?: No apparent visual deficits            Pertinent Vitals/Pain Pain Assessment Pain Assessment: Faces Faces Pain Scale: Hurts little more Pain Location: L hip Pain Descriptors / Indicators: Sore Pain Intervention(s): Monitored during session     Extremity/Trunk Assessment Upper Extremity Assessment Upper Extremity Assessment: Generalized weakness   Lower Extremity Assessment Lower Extremity Assessment: Defer to PT evaluation   Cervical / Trunk Assessment Cervical /  Trunk Assessment: Kyphotic   Communication Communication Communication:  Impaired Factors Affecting Communication: Hearing impaired (hearing aide is present but battery is dead)   Cognition Arousal: Alert Behavior During Therapy: Anxious Cognition: Cognition impaired     Awareness: Online awareness impaired Memory impairment (select all impairments): Short-term memory, Working civil service fast streamer, Non-declarative long-term memory Attention impairment (select first level of impairment): Sustained attention Executive functioning impairment (select all impairments): Problem solving, Reasoning OT - Cognition Comments: Pt with memory deficits, poor attention can be tangiental, family reports hx of memory deficts at baseline     Following commands: Intact       Cueing  General Comments   Cueing Techniques: Verbal cues;Visual cues;Tactile cues  VSS on RA           Home Living Family/patient expects to be discharged to:: Other (Comment)     Additional Comments: [facility]      Prior Functioning/Environment Prior Level of Function : Needs assist     Mobility Comments: pt typically performs stand pivot transfers to her manual wheelchair, family had been discouraging ambulation recently due to falls risk. Pt self-propels wheelchair within her apartment, stands for ADLs ADLs Comments: Pt self reports hx of incontinence. Per son, pt relucatant to receive assistance for ADLs, memory deficits present. Children are available on the weekends, no caregiver support during the week. Son reports family have setup an appointment with hired aide services when the pt is discharged    OT Problem List: Decreased strength;Decreased range of motion;Decreased activity tolerance;Impaired balance (sitting and/or standing);Decreased cognition;Decreased safety awareness;Decreased knowledge of use of DME or AE   OT Treatment/Interventions: Self-care/ADL training;Therapeutic exercise;DME and/or AE instruction;Energy conservation;Therapeutic activities;Patient/family education;Balance  training      OT Goals(Current goals can be found in the care plan section)   Acute Rehab OT Goals Patient Stated Goal: To get better OT Goal Formulation: With patient Time For Goal Achievement: 09/18/24 Potential to Achieve Goals: Good   OT Frequency:  Min 2X/week       AM-PAC OT 6 Clicks Daily Activity     Outcome Measure Help from another person eating meals?: None Help from another person taking care of personal grooming?: A Little Help from another person toileting, which includes using toliet, bedpan, or urinal?: Total Help from another person bathing (including washing, rinsing, drying)?: A Lot Help from another person to put on and taking off regular upper body clothing?: A Little Help from another person to put on and taking off regular lower body clothing?: A Lot 6 Click Score: 15   End of Session Equipment Utilized During Treatment: Gait belt;Rolling walker (2 wheels) Nurse Communication: Mobility status  Activity Tolerance: Patient tolerated treatment well Patient left: in chair;with call bell/phone within reach;with chair alarm set  OT Visit Diagnosis: Unsteadiness on feet (R26.81);Other abnormalities of gait and mobility (R26.89);Muscle weakness (generalized) (M62.81);History of falling (Z91.81)                Time: 8940-8865 OT Time Calculation (min): 35 min Charges:  OT General Charges $OT Visit: 1 Visit OT Evaluation $OT Eval Moderate Complexity: 1 Mod OT Treatments $Self Care/Home Management : 8-22 mins  Adrianne BROCKS, OT  Acute Rehabilitation Services Office [phone] Secure chat preferred   Adrianne GORMAN Savers 09/04/2024, 2:05 PM

## 2024-09-04 NOTE — Progress Notes (Addendum)
 " PROGRESS NOTE    Rhonda Watkins  FMW:[id number] DOB: [birth year] DOA: 09/02/2024 PCP: Charlott Dorn LABOR, MD   Brief Narrative: 89 year old with past medical history significant for diabetes, hypertension, hyperlipidemia, asthma, GERD, prior history of SBO status post exploratory laparotomy lysis of adhesion for 90 minutes 10/2022, previous cholecystectomy, colon surgery, hernia repair, presents complaining of abdominal pain nausea vomiting.  CT imaging concerning for SBO. General surgery consulted and following.  Patient was started on a small bowel protocol.   Assessment & Plan:   Principal Problem:   SBO (small bowel obstruction) (HCC)  1-SBO: - Presents with abdominal pain, nausea vomiting.  CT scan concerning for SBO, chronic changes unclear if contained perforation or infection. - IV Protonix  twice daily - Appreciate general surgery evaluation -NSL  -KUB looks better.  -Discontinue IV antibiotics, per Surgery recommendations.  -NG tube removed.  Had BM day before.  Diet advanced to full liquid.   Lactic acidosis: - In the setting of hypovolemia, SBO, also concern for infection. - Treated  with IV fluids Resolved.    Leukocytosis: - In the setting of SBO and or concern for infection. - Trending down.    Asthma: - As needed albuterol  - Continue with  Pulmicort --hold fluticasone  inhaler   Diabetes type 2 - Hold Lantus  while NPO. - Scale insulin    Concern for CBD stone, dilation:  -Not significantly elevated Alk phosphatase.  -US : Dilated distal common bile duct without visualization of obstructing gallstone. Per general surgery no need to proceed with MRCP, liver function test trending down  Hypokalemia; replete orally.    4 mm left lower lobe pulmonary nodule. FU Out patient.   Memory problems; per son patient has been having memory problem. She will need neurology evaluation out patient.   Neuropathy; check B 12.     Estimated body mass index is 22.22  kg/m as calculated from the following:   Height as of this encounter: 5' 2 (1.575 m).   Weight as of this encounter: 55.1 kg.   DVT prophylaxis: Lovenox  Code Status: DNR Family Communication: Son updated 1/24--1/25 Disposition Plan:  Status is: Inpatient Remains inpatient appropriate because: Management of SBO, PT, PT, might need rehab,    Consultants:  General surgery  Procedures:  NG tube  Antimicrobials:    Subjective: She is doing better, denies abdominal pain, BG tube removed.  Passing gas. Tolerating clear liquid diet.  Report hip pain, chronic, wrist pain and chronic numbness finger.   Objective: Vitals:   09/03/24 1745 09/04/24 0500 09/04/24 0515 09/04/24 1110  BP: 105/68  (!) 142/72 139/78  Pulse:   81 77  Resp: 16  15 18   Temp: (!) 97.2 F (36.2 C)  97.8 F (36.6 C)   TempSrc:   Oral   SpO2:   100% 100%  Weight:  55.1 kg    Height:        Intake/Output Summary (Last 24 hours) at 09/04/2024 1142 Last data filed at 09/03/2024 2008 Gross per 24 hour  Intake 240 ml  Output 50 ml  Net 190 ml   Filed Weights   09/02/24 0204 09/03/24 0500 09/04/24 0500  Weight: 56.2 kg 53.2 kg 55.1 kg    Examination:  General exam: NAD  Respiratory system: CTA Cardiovascular system: SS 1, S 2 RRR Gastrointestinal system: BS present, soft, nt Central nervous system: alert, hard of hearing.  Extremities: Symmetric 5 x 5 power.   Data Reviewed: I have personally reviewed following labs and imaging studies  CBC: Recent Labs  Lab 09/02/24 0222 09/03/24 1135 09/04/24 0538  WBC 15.6* 8.6 8.4  NEUTROABS 12.0*  --   --   HGB 14.6 13.2 11.8*  HCT 44.5 41.3 36.3  MCV 90.1 91.8 90.8  PLT 289 202 179   Basic Metabolic Panel: Recent Labs  Lab 09/02/24 0222 09/03/24 1135 09/04/24 0538  NA 138 144 141  K 3.9 3.4* 3.4*  CL 101 107 105  CO2 19* 26 25  GLUCOSE 180* 106* 124*  BUN 16 15 11   CREATININE 0.71 0.61 0.54  CALCIUM  10.4* 9.1 8.7*  MG  --  1.9  --     GFR: Estimated Creatinine Clearance: 37 mL/min (by C-G formula based on SCr of 0.54 mg/dL). Liver Function Tests: Recent Labs  Lab 09/02/24 0222 09/03/24 1135  AST 22 18  ALT 10 6  ALKPHOS 155* 116  BILITOT 0.6 0.7  PROT 8.0 6.2*  ALBUMIN  4.4 3.4*   Recent Labs  Lab 09/02/24 0222  LIPASE 11   No results for input(s): AMMONIA in the last 168 hours. Coagulation Profile: No results for input(s): INR, PROTIME in the last 168 hours. Cardiac Enzymes: No results for input(s): CKTOTAL, CKMB, CKMBINDEX, TROPONINI in the last 168 hours. BNP (last 3 results) No results for input(s): PROBNP in the last 8760 hours. HbA1C: Recent Labs    09/02/24 1431  HGBA1C 7.0*   CBG: Recent Labs  Lab 09/02/24 2343 09/03/24 0615 09/03/24 1228 09/03/24 2355 09/04/24 0512  GLUCAP 178* 97 114* 120* 114*   Lipid Profile: No results for input(s): CHOL, HDL, LDLCALC, TRIG, CHOLHDL, LDLDIRECT in the last 72 hours. Thyroid  Function Tests: No results for input(s): TSH, T4TOTAL, FREET4, T3FREE, THYROIDAB in the last 72 hours. Anemia Panel: No results for input(s): VITAMINB12, FOLATE, FERRITIN, TIBC, IRON, RETICCTPCT in the last 72 hours. Sepsis Labs: Recent Labs  Lab 09/02/24 0316 09/02/24 0555  LATICACIDVEN 2.7* 1.6    No results found for this or any previous visit (from the past 240 hours).       Radiology Studies: DG Abd Portable 1V-Small Bowel Obstruction Protocol-initial, 8 hr delay Result Date: 09/03/2024 EXAM: 1 VIEW XRAY OF THE ABDOMEN 09/03/2024 03:00:00 AM COMPARISON: 09/02/2024 CLINICAL HISTORY: FINDINGS: LINES, TUBES AND DEVICES: Enteric tube in gastric body. BOWEL: Administered oral contrast opacifies numerous loops of nondistended small bowel, and fills the nondilated colon extending into the rectosigmoid colon. The findings are in keeping with a resolved small bowel obstruction. No free intraperitoneal gas. SOFT TISSUES:  Surgical clips in right upper quadrant, consistent with prior cholecystectomy. No abnormal calcifications. BONES: Postoperative changes in lower lumbar spine. No acute fracture. IMPRESSION: 1. Findings consistent with resolved small bowel obstruction. 2. Enteric tube terminates in the gastric body. 3. Prior cholecystectomy and postoperative changes in the lower lumbar spine. Electronically signed by: Dorethia Molt MD 09/03/2024 03:52 AM EST RP Workstation: [id number]        Scheduled Meds:  budesonide  (PULMICORT ) nebulizer solution  0.25 mg Nebulization BID   enoxaparin  (LOVENOX ) injection  40 mg Subcutaneous QHS   insulin  aspart  0-6 Units Subcutaneous Q6H   pantoprazole  (PROTONIX ) IV  40 mg Intravenous Q12H   Continuous Infusions:  lactated ringers  1,000 mL with potassium chloride  20 mEq infusion 75 mL/hr at 09/03/24 2320     LOS: 2 days    Time spent: 35 Minutes    Kelley Knoth A Trivia Heffelfinger, MD [facility]   If 7PM-7AM, please contact night-coverage [email or web address]  09/04/2024, 11:42 AM   "

## 2024-09-04 NOTE — Progress Notes (Signed)
 "    Subjective/Chief Complaint: Tolerating clamping all day yesterday No nausea. BM recorded yesterday Tolerating clears   Objective: Vital signs in last 24 hours: Temp:  [97.2 F (36.2 C)-97.8 F (36.6 C)] 97.8 F (36.6 C) (01/25 0515) Pulse Rate:  [81-83] 81 (01/25 0515) Resp:  [15-16] 15 (01/25 0515) BP: (105-142)/(68-97) 142/72 (01/25 0515) SpO2:  [100 %] 100 % (01/25 0515) Weight:  [55.1 kg] 55.1 kg (01/25 0500) Last BM Date : 09/03/24  Intake/Output from previous day: 01/24 0701 - 01/25 0700 In: 480 [P.O.:480] Out: 50 [Emesis/NG output:50] Intake/Output this shift: No intake/output data recorded.  Elderly female Very hard of hearing Abd - soft, non-distended, non-tender  Lab Results:  Recent Labs    09/03/24 1135 09/04/24 0538  WBC 8.6 8.4  HGB 13.2 11.8*  HCT 41.3 36.3  PLT 202 179   BMET Recent Labs    09/03/24 1135 09/04/24 0538  NA 144 141  K 3.4* 3.4*  CL 107 105  CO2 26 25  GLUCOSE 106* 124*  BUN 15 11  CREATININE 0.61 0.54  CALCIUM  9.1 8.7*     Studies/Results: DG Abd Portable 1V-Small Bowel Obstruction Protocol-initial, 8 hr delay Result Date: 09/03/2024 EXAM: 1 VIEW XRAY OF THE ABDOMEN 09/03/2024 03:00:00 AM COMPARISON: 09/02/2024 CLINICAL HISTORY: FINDINGS: LINES, TUBES AND DEVICES: Enteric tube in gastric body. BOWEL: Administered oral contrast opacifies numerous loops of nondistended small bowel, and fills the nondilated colon extending into the rectosigmoid colon. The findings are in keeping with a resolved small bowel obstruction. No free intraperitoneal gas. SOFT TISSUES: Surgical clips in right upper quadrant, consistent with prior cholecystectomy. No abnormal calcifications. BONES: Postoperative changes in lower lumbar spine. No acute fracture. IMPRESSION: 1. Findings consistent with resolved small bowel obstruction. 2. Enteric tube terminates in the gastric body. 3. Prior cholecystectomy and postoperative changes in the lower lumbar  spine. Electronically signed by: Dorethia Molt MD 09/03/2024 03:52 AM EST RP Workstation: [id number]   US  Abdomen Limited RUQ (LIVER/GB) Result Date: 09/02/2024 CLINICAL DATA:  History of prior cholecystectomy sent to evaluate for choledocholithiasis. EXAM: ULTRASOUND ABDOMEN LIMITED RIGHT UPPER QUADRANT COMPARISON:  None Available. FINDINGS: Gallbladder: The gallbladder is surgically absent. Common bile duct: Diameter: 7.98 mm (9.87 mm distally) Liver: No focal lesion identified. There is evidence of intrahepatic biliary dilatation. Diffusely decreased echogenicity of the liver parenchyma is noted. Portal vein is patent on color Doppler imaging with normal direction of blood flow towards the liver. Other: The study is technically limited secondary to the patient's body habitus and inability to follow commands during the examination, as per the ultrasound technologist. IMPRESSION: 1. Findings consistent with prior cholecystectomy. 2. Dilated distal common bile duct without visualization of obstructing gallstones. Further evaluation with MRCP is with if choledocholithiasis remains of clinical concern. Electronically Signed   By: Suzen Dials M.D.   On: 09/02/2024 10:59   DG Abd 1 View Result Date: 09/02/2024 CLINICAL DATA:  Nasogastric tube placement EXAM: ABDOMEN - 1 VIEW COMPARISON:  September 30, 2023 FINDINGS: Distal tip of nasogastric tube is seen in proximal stomach. IMPRESSION: Distal tip of nasogastric tube is seen in proximal stomach. Electronically Signed   By: Lynwood Landy Raddle M.D.   On: 09/02/2024 10:57    Anti-infectives: Anti-infectives (From admission, onward)    Start     Dose/Rate Route Frequency Ordered Stop   09/02/24 0845  piperacillin -tazobactam (ZOSYN ) IVPB 3.375 g  Status:  Discontinued        3.375 g 12.5 mL/hr over  240 Minutes Intravenous Every 8 hours 09/02/24 0833 09/03/24 1428       Assessment/Plan: SBO, likely adhesive due to history of previous abdominal surgeries   SBO appears to be resolved Discontinue NG tube Full liquids - advance as tolerated No surgical indication for ongoing antibiotics   Dilated CBD on imaging - s/p remote cholecystectomy.  Normal LFT's.  No need for further work-up.   LOS: 2 days    Donnice MARLA Lima 09/04/2024  "

## 2024-09-04 NOTE — Plan of Care (Signed)
   Problem: Coping: Goal: Ability to adjust to condition or change in health will improve Outcome: Progressing

## 2024-09-04 NOTE — Evaluation (Signed)
 Physical Therapy Evaluation Patient Details Name: Rhonda Watkins MRN: [id number] DOB: [birth year] Today's Date: 09/04/2024  History of Present Illness  89 y.o. female presents to [facility] hospital on 09/02/2024 with abdominal pain and emesis. Imaging notable for SBO. PMH includes DM, HTN, HLD, asthma, GERD, cholecystectomy.  Clinical Impression  Pt presents to PT with deficits in activity tolerance, gait, strength, power, endurance. Pt is able to perform bed mobility without physical assistance but benefits from physical assistance and verbal/tactile cueing to improve safety during transfers. Pt has memory deficits which appear acute on chronic per pt's son's report, increasing her risk for falls. Patient will benefit from continued inpatient follow up therapy, <3 hours/day.        If plan is discharge home, recommend the following: A little help with walking and/or transfers;A lot of help with bathing/dressing/bathroom;Assistance with cooking/housework;Direct supervision/assist for medications management;Direct supervision/assist for financial management;Assist for transportation;Supervision due to cognitive status   Can travel by private vehicle   Yes    Equipment Recommendations None recommended by PT  Recommendations for Other Services       Functional Status Assessment Patient has had a recent decline in their functional status and demonstrates the ability to make significant improvements in function in a reasonable and predictable amount of time.     Precautions / Restrictions Precautions Precautions: Fall Recall of Precautions/Restrictions: Impaired Restrictions Weight Bearing Restrictions Per Provider Order: No      Mobility  Bed Mobility Overal bed mobility: Needs Assistance Bed Mobility: Supine to Sit     Supine to sit: Supervision, HOB elevated          Transfers Overall transfer level: Needs assistance Equipment used: Rolling walker (2 wheels) Transfers: Sit  to/from Stand, Bed to chair/wheelchair/BSC Sit to Stand: Min assist   Step pivot transfers: Min assist            Ambulation/Gait Ambulation/Gait assistance: Min assist Gait Distance (Feet): 5 Feet Assistive device: Rolling walker (2 wheels) Gait Pattern/deviations: Step-to pattern, Trunk flexed Gait velocity: reduced Gait velocity interpretation: <1.31 ft/sec, indicative of household ambulator   General Gait Details: short shuffling steps from bed to recliner  Stairs            Wheelchair Mobility     Tilt Bed    Modified Rankin (Stroke Patients Only)       Balance Overall balance assessment: Needs assistance Sitting-balance support: No upper extremity supported, Feet supported Sitting balance-Leahy Scale: Good     Standing balance support: Single extremity supported, Reliant on assistive device for balance Standing balance-Leahy Scale: Poor                               Pertinent Vitals/Pain Pain Assessment Pain Assessment: Faces Faces Pain Scale: Hurts little more Pain Location: L hip Pain Descriptors / Indicators: Sore Pain Intervention(s): Monitored during session    Home Living Family/patient expects to be discharged to:: Other (Comment)                   Additional Comments: [facility]    Prior Function Prior Level of Function : Needs assist             Mobility Comments: pt typically performs stand pivot transfers to her manual wheelchair, family had been discouraging ambulation recently due to falls risk. Pt self-propels wheelchair within her apartment, stands for ADLs ADLs Comments: pt often refuses assistance with ADLs per son's  report, memory deficits present. Children are available on the weekends, no caregiver support during the week. Son reports family have setup an appointment with hired aide services when the pt is discharged     Extremity/Trunk Assessment   Upper Extremity Assessment Upper Extremity  Assessment: Generalized weakness    Lower Extremity Assessment Lower Extremity Assessment: Generalized weakness    Cervical / Trunk Assessment Cervical / Trunk Assessment: Kyphotic  Communication   Communication Communication: Impaired Factors Affecting Communication: Hearing impaired (hearing aide is present but battery is dead)    Cognition Arousal: Alert Behavior During Therapy: Anxious   PT - Cognitive impairments: Awareness, Memory, Attention, Problem solving, Safety/Judgement                       PT - Cognition Comments: son reports pt has memory deficits at baseline with very poor short term memory Following commands: Intact       Cueing Cueing Techniques: Verbal cues, Visual cues, Tactile cues     General Comments General comments (skin integrity, edema, etc.): VSS on RA, pt reports a history of urinary incontinence and is incontinent of urine during session    Exercises     Assessment/Plan    PT Assessment Patient needs continued PT services  PT Problem List Decreased strength;Decreased activity tolerance;Decreased balance;Decreased mobility;Decreased knowledge of use of DME;Decreased safety awareness;Decreased knowledge of precautions       PT Treatment Interventions DME instruction;Functional mobility training;Therapeutic activities;Therapeutic exercise;Balance training;Neuromuscular re-education;Cognitive remediation;Patient/family education;Wheelchair mobility training;Gait training    PT Goals (Current goals can be found in the Care Plan section)  Acute Rehab PT Goals Patient Stated Goal: to reduce falls risk PT Goal Formulation: With patient Time For Goal Achievement: 09/18/24 Potential to Achieve Goals: Fair Additional Goals Additional Goal #1: Pt will mobilize in a manual wheelchair at a modI level for 100' to demonstrates independence in household mobility    Frequency Min 2X/week     Co-evaluation               AM-PAC PT 6  Clicks Mobility  Outcome Measure Help needed turning from your back to your side while in a flat bed without using bedrails?: A Little Help needed moving from lying on your back to sitting on the side of a flat bed without using bedrails?: A Little Help needed moving to and from a bed to a chair (including a wheelchair)?: A Little Help needed standing up from a chair using your arms (e.g., wheelchair or bedside chair)?: A Little Help needed to walk in hospital room?: Total Help needed climbing 3-5 steps with a railing? : Total 6 Click Score: 14    End of Session Equipment Utilized During Treatment: Gait belt Activity Tolerance: Patient tolerated treatment well Patient left: in chair;with call bell/phone within reach;with chair alarm set Nurse Communication: Mobility status PT Visit Diagnosis: Other abnormalities of gait and mobility (R26.89);Muscle weakness (generalized) (M62.81)    Time: 8884-8858 PT Time Calculation (min) (ACUTE ONLY): 26 min   Charges:   PT Evaluation $PT Eval Low Complexity: 1 Low   PT General Charges $$ ACUTE PT VISIT: 1 Visit         Rhonda Watkins, PT, DPT Acute Rehabilitation Office [phone]   Rhonda Watkins 09/04/2024, 12:29 PM

## 2024-09-04 NOTE — TOC Initial Note (Signed)
 Transition of Care [facility]) - Initial/Assessment Note    Patient Details  Name: Rhonda Watkins MRN: [id number] Date of Birth: [birth year]  Transition of Care [facility]) CM/SW Contact:    Inocente GORMAN Kindle, LCSW Phone Number: 09/04/2024, 11:50 AM  Clinical Narrative:                 Patient admitted from [facility] (potentially [place] level[place] CSW attempted to contact facility x2 to ascertain patient's prior level of care needs. Will continue to follow.     Barriers to Discharge: Continued Medical Work up   Patient Goals and CMS Choice            Expected Discharge Plan and Services                                              Prior Living Arrangements/Services   Lives with:: Self Patient language and need for interpreter reviewed:: Yes Do you feel safe going back to the place where you live?: Yes      Need for Family Participation in Patient Care: Yes (Comment) Care giver support system in place?: Yes (comment)   Criminal Activity/Legal Involvement Pertinent to Current Situation/Hospitalization: No - Comment as needed  Activities of Daily Living   ADL Screening (condition at time of admission) Independently performs ADLs?: No Does the patient have a NEW difficulty with bathing/dressing/toileting/self-feeding that is expected to last >3 days?: No Does the patient have a NEW difficulty with getting in/out of bed, walking, or climbing stairs that is expected to last >3 days?: No Does the patient have a NEW difficulty with communication that is expected to last >3 days?: No Is the patient deaf or have difficulty hearing?: No Does the patient have difficulty seeing, even when wearing glasses/contacts?: Yes Does the patient have difficulty concentrating, remembering, or making decisions?: No  Permission Sought/Granted Permission sought to share information with : Facility Medical Sales Representative, Family Supports Permission granted to share information with :  No  Share Information with NAME: Reiber,SCOTT- Son [phone]  Permission granted to share info w AGENCY: [facility]        Emotional Assessment Appearance:: Appears stated age Attitude/Demeanor/Rapport: Unable to Assess Affect (typically observed): Unable to Assess Orientation: : Oriented to Self, Oriented to Situation Alcohol / Substance Use: Not Applicable Psych Involvement: No (comment)  Admission diagnosis:  Small bowel obstruction (HCC) [K56.609] SBO (small bowel obstruction) (HCC) [K56.609] Patient Active Problem List   Diagnosis Date Noted   Femur fracture, right (HCC) 09/12/2023   SBO (small bowel obstruction) (HCC) 11/03/2022   Age-related osteoporosis without current pathological fracture 12/24/2020   Mild persistent asthma, uncomplicated 12/24/2020   Type 2 diabetes mellitus with other specified complication (HCC) 12/24/2020   Body mass index (BMI) 25.0-25.9, adult 03/09/2020   Lumbago of lumbar region with sciatica 07/14/2019   Bilateral hand pain 06/26/2017   Ganglion cyst of flexor tendon sheath of finger of right hand 06/26/2017   Primary osteoarthritis of both hands 06/26/2017   Trigger ring finger of right hand 06/26/2017   Ingrown right big toenail 05/08/2016   Displacement of cervical intervertebral disc without myelopathy 03/01/2015   Bursitis 01/30/2015   HNP (herniated nucleus pulposus), lumbar 01/12/2015   Displacement of lumbar intervertebral disc without myelopathy 02/16/2014   Essential hypertension 09/17/2013   Mitral regurgitation 09/17/2013   GERD (gastroesophageal reflux disease) 09/17/2013  Spinal stenosis 09/17/2013   Hypercholesterolemia 09/17/2013   PCP:  Charlott Dorn LABOR, MD Pharmacy:   [facility] [place], [place] - [address] [facility] [address] [place] [address] Phone: [phone] Fax: [phone]     Social Drivers of Health (SDOH) Social History: SDOH Screenings   Food  Insecurity: Patient Unable To Answer (09/02/2024)  Housing: Patient Unable To Answer (09/02/2024)  Transportation Needs: Patient Unable To Answer (09/02/2024)  Utilities: Patient Unable To Answer (09/02/2024)  Social Connections: Patient Unable To Answer (09/02/2024)  Tobacco Use: Medium Risk (09/02/2024)   SDOH Interventions: Food Insecurity Interventions: Intervention Not Indicated (SNF) Housing Interventions: Patient Unable to Answer Social Connections Interventions: Patient Unable to Answer   Readmission Risk Interventions     No data to display

## 2024-09-05 ENCOUNTER — Inpatient Hospital Stay (HOSPITAL_COMMUNITY)

## 2024-09-05 LAB — GLUCOSE, CAPILLARY
Glucose-Capillary: 118 mg/dL — ABNORMAL HIGH (ref 70–99)
Glucose-Capillary: 120 mg/dL — ABNORMAL HIGH (ref 70–99)
Glucose-Capillary: 131 mg/dL — ABNORMAL HIGH (ref 70–99)
Glucose-Capillary: 222 mg/dL — ABNORMAL HIGH (ref 70–99)

## 2024-09-05 LAB — BASIC METABOLIC PANEL WITH GFR
Anion gap: 10 (ref 5–15)
BUN: 7 mg/dL — ABNORMAL LOW (ref 8–23)
CO2: 24 mmol/L (ref 22–32)
Calcium: 8.7 mg/dL — ABNORMAL LOW (ref 8.9–10.3)
Chloride: 104 mmol/L (ref 98–111)
Creatinine, Ser: 0.49 mg/dL (ref 0.44–1.00)
GFR, Estimated: >60 mL/min
Glucose, Bld: 124 mg/dL — ABNORMAL HIGH (ref 70–99)
Potassium: 3.5 mmol/L (ref 3.5–5.1)
Sodium: 138 mmol/L (ref 135–145)

## 2024-09-05 LAB — VITAMIN B12: Vitamin B-12: 258 pg/mL (ref 180–914)

## 2024-09-05 MED ORDER — ENSURE PLUS HIGH PROTEIN PO LIQD
237.0000 mL | Freq: Two times a day (BID) | ORAL | Status: DC
  Administered 2024-09-06 – 2024-09-07 (×3): 237 mL via ORAL

## 2024-09-05 MED ORDER — VITAMIN B-12 1000 MCG PO TABS
500.0000 ug | ORAL_TABLET | Freq: Every day | ORAL | Status: DC
  Administered 2024-09-06 – 2024-09-08 (×3): 500 ug via ORAL
  Filled 2024-09-05 (×3): qty 1

## 2024-09-05 MED ORDER — POTASSIUM CHLORIDE CRYS ER 20 MEQ PO TBCR
40.0000 meq | EXTENDED_RELEASE_TABLET | Freq: Once | ORAL | Status: AC
  Administered 2024-09-05: 40 meq via ORAL
  Filled 2024-09-05: qty 2

## 2024-09-05 MED ORDER — CYANOCOBALAMIN 1000 MCG/ML IJ SOLN
1000.0000 ug | Freq: Once | INTRAMUSCULAR | Status: AC
  Administered 2024-09-05: 1000 ug via INTRAMUSCULAR
  Filled 2024-09-05: qty 1

## 2024-09-05 NOTE — NC FL2 (Signed)
 " [place]  MEDICAID FL2 LEVEL OF CARE FORM     IDENTIFICATION  Patient Name: Rhonda Watkins Birthdate: 08-Dec-1931 Sex: female Admission Date (Current Location): 09/02/2024  [facility] and [place] Number:  Producer, Television/film/video and Address:  [facility], [address] [place], [place] [address]      Provider Number: [id number]  Attending Physician Name and Address:  Madelyne Owen LABOR, MD  Relative Name and Phone Number:  [name] [phone])  [phone]    Current Level of Care: Hospital Recommended Level of Care: Skilled Nursing Facility Prior Approval Number:    Date Approved/Denied:   PASRR Number: [id number]  Discharge Plan: SNF    Current Diagnoses: Patient Active Problem List   Diagnosis Date Noted   Femur fracture, right (HCC) 09/12/2023   SBO (small bowel obstruction) (HCC) 11/03/2022   Age-related osteoporosis without current pathological fracture 12/24/2020   Mild persistent asthma, uncomplicated 12/24/2020   Type 2 diabetes mellitus with other specified complication (HCC) 12/24/2020   Body mass index (BMI) 25.0-25.9, adult 03/09/2020   Lumbago of lumbar region with sciatica 07/14/2019   Bilateral hand pain 06/26/2017   Ganglion cyst of flexor tendon sheath of finger of right hand 06/26/2017   Primary osteoarthritis of both hands 06/26/2017   Trigger ring finger of right hand 06/26/2017   Ingrown right big toenail 05/08/2016   Displacement of cervical intervertebral disc without myelopathy 03/01/2015   Bursitis 01/30/2015   HNP (herniated nucleus pulposus), lumbar 01/12/2015   Displacement of lumbar intervertebral disc without myelopathy 02/16/2014   Essential hypertension 09/17/2013   Mitral regurgitation 09/17/2013   GERD (gastroesophageal reflux disease) 09/17/2013   Spinal stenosis 09/17/2013   Hypercholesterolemia 09/17/2013    Orientation RESPIRATION BLADDER Height & Weight     Self, Place  Normal Incontinent Weight: 123 lb 7.3  oz (56 kg) Height:  5' 2 (157.5 cm)  BEHAVIORAL SYMPTOMS/MOOD NEUROLOGICAL BOWEL NUTRITION STATUS      Continent Diet (See DC Summary)  AMBULATORY STATUS COMMUNICATION OF NEEDS Skin   Limited Assist Verbally Normal                       Personal Care Assistance Level of Assistance  Bathing, Feeding, Dressing Bathing Assistance: Maximum assistance Feeding assistance: Limited assistance Dressing Assistance: Maximum assistance     Functional Limitations Info  Sight, Hearing, Speech Sight Info: Impaired Hearing Info: Impaired Speech Info: Adequate    SPECIAL CARE FACTORS FREQUENCY  PT (By licensed PT), OT (By licensed OT)     PT Frequency: 5x/week OT Frequency: 5x/week            Contractures Contractures Info: Not present    Additional Factors Info  Code Status, Allergies Code Status Info: DNR-Limited Allergies Info: Chocolate; Jardiance (empagliflozin); Metformin & related; Tradjenta (linagliptin)           Current Medications (09/05/2024):  This is the current hospital active medication list Current Facility-Administered Medications  Medication Dose Route Frequency Provider Last Rate Last Admin   acetaminophen  (TYLENOL ) tablet 650 mg  650 mg Oral Q6H PRN Howerter, Justin B, DO       Or   acetaminophen  (TYLENOL ) suppository 650 mg  650 mg Rectal Q6H PRN Howerter, Justin B, DO       albuterol  (PROVENTIL ) (2.5 MG/3ML) 0.083% nebulizer solution 2.5 mg  2.5 mg Inhalation Q6H PRN Regalado, Belkys A, MD       budesonide  (PULMICORT ) nebulizer solution 0.25 mg  0.25 mg Nebulization BID Regalado, Belkys A, MD   0.25 mg at 09/05/24 1256   [START ON 09/06/2024] cyanocobalamin  (VITAMIN B12) tablet 500 mcg  500 mcg Oral Daily Regalado, Belkys A, MD       docusate sodium  (COLACE) capsule 100 mg  100 mg Oral BID Regalado, Belkys A, MD   100 mg at 09/05/24 9077   enoxaparin  (LOVENOX ) injection 40 mg  40 mg Subcutaneous QHS Regalado, Belkys A, MD   40 mg at 09/04/24 2349    [START ON 09/06/2024] feeding supplement (ENSURE PLUS HIGH PROTEIN) liquid 237 mL  237 mL Oral BID BM Regalado, Belkys A, MD       fluticasone  (FLONASE ) 50 MCG/ACT nasal spray 2 spray  2 spray Each Nare Daily PRN Regalado, Belkys A, MD       hydrALAZINE  (APRESOLINE ) injection 5 mg  5 mg Intravenous Q6H PRN Regalado, Belkys A, MD       HYDROmorphone  (DILAUDID ) injection 0.5 mg  0.5 mg Intravenous Q2H PRN Howerter, Justin B, DO   0.5 mg at 09/05/24 0042   insulin  aspart (novoLOG ) injection 0-6 Units  0-6 Units Subcutaneous Q6H Regalado, Belkys A, MD   2 Units at 09/05/24 1257   lidocaine  (LIDODERM ) 5 % 1 patch  1 patch Transdermal Q24H Regalado, Belkys A, MD   1 patch at 09/05/24 1257   ondansetron  (ZOFRAN ) injection 4 mg  4 mg Intravenous Q6H PRN Howerter, Justin B, DO       pantoprazole  (PROTONIX ) injection 40 mg  40 mg Intravenous Q12H Regalado, Belkys A, MD   40 mg at 09/05/24 9077     Discharge Medications: Please see discharge summary for a list of discharge medications.  Relevant Imaging Results:  Relevant Lab Results:   Additional Information SSN: [id number]  Jeoffrey LITTIE Moose, LCSWA     "

## 2024-09-05 NOTE — Plan of Care (Signed)
" °  Problem: Nutrition: Goal: Adequate nutrition will be maintained Outcome: Progressing   Problem: Coping: Goal: Ability to adjust to condition or change in health will improve Outcome: Not Progressing   Problem: Health Behavior/Discharge Planning: Goal: Ability to manage health-related needs will improve Outcome: Not Progressing   Problem: Activity: Goal: Risk for activity intolerance will decrease Outcome: Not Progressing   "

## 2024-09-05 NOTE — Progress Notes (Addendum)
 " PROGRESS NOTE    DARSI TIEN  FMW:[id number] DOB: 04-03-1932 DOA: 09/02/2024 PCP: Charlott Dorn LABOR, MD   Brief Narrative: 89 year old with past medical history significant for diabetes, hypertension, hyperlipidemia, asthma, GERD, prior history of SBO status post exploratory laparotomy lysis of adhesion for 90 minutes 10/2022, previous cholecystectomy, colon surgery, hernia repair, presents complaining of abdominal pain nausea vomiting.  CT imaging concerning for SBO. General surgery consulted and following.  Patient was started on a small bowel protocol.   Assessment & Plan:   Principal Problem:   SBO (small bowel obstruction) (HCC)  1-SBO: - Presents with abdominal pain, nausea vomiting.  CT scan concerning for SBO, chronic changes unclear if contained perforation or infection. - Protonix  twice daily - Appreciate general surgery evaluation -KUB looks better.  -Discontinue IV antibiotics, per Surgery recommendations.  -NG tube removed.  -Had BM Advanced diet to soft today  Lactic acidosis: - In the setting of hypovolemia, SBO, also concern for infection. - Treated  with IV fluids Resolved.    Leukocytosis: - In the setting of SBO and or concern for infection. - Trending down.    Asthma: - As needed albuterol  - Continue with  Pulmicort --hold fluticasone  inhaler   Diabetes type 2 - Hold Lantus  while NPO. - Scale insulin    Concern for CBD stone, dilation:  -Not significantly elevated Alk phosphatase.  -US : Dilated distal common bile duct without visualization of obstructing gallstone. Per general surgery no need to proceed with MRCP, liver function test trending down  Hypokalemia; Replace.d    4 mm left lower lobe pulmonary nodule. FU Out patient.   Memory problems; per son patient has been having memory problem. She will need neurology evaluation out patient.   Neuropathy;  B 12. Low normal. Start supplement.   Left hip pain. Will get x ray      Estimated body mass index is 22.58 kg/m as calculated from the following:   Height as of this encounter: 5' 2 (1.575 m).   Weight as of this encounter: 56 kg.   DVT prophylaxis: Lovenox  Code Status: DNR Family Communication: Son updated 1/24--1/25 Disposition Plan:  Status is: Inpatient Remains inpatient appropriate because: Management of SBO, PT, PT, might need rehab,    Consultants:  General surgery  Procedures:  NG tube  Antimicrobials:    Subjective: She is doing ok, denies abdominal pain. Had BM think yesterday.   Objective: Vitals:   09/04/24 2013 09/05/24 0500 09/05/24 0623 09/05/24 0940  BP:   118/81 (!) 151/64  Pulse:   67 74  Resp:   18 16  Temp:   (!) 97.5 F (36.4 C) 98.2 F (36.8 C)  TempSrc:   Oral Oral  SpO2: 100%  100% 100%  Weight:  56 kg    Height:        Intake/Output Summary (Last 24 hours) at 09/05/2024 1444 Last data filed at 09/04/2024 2340 Gross per 24 hour  Intake 120 ml  Output --  Net 120 ml   Filed Weights   09/03/24 0500 09/04/24 0500 09/05/24 0500  Weight: 53.2 kg 55.1 kg 56 kg    Examination:  General exam: NAD Respiratory system: CTA Cardiovascular system: S 1, S 2 RRR Gastrointestinal system: BS present, soft, nt Central nervous system: Alert.  Extremities: no edema.    Data Reviewed: I have personally reviewed following labs and imaging studies  CBC: Recent Labs  Lab 09/02/24 0222 09/03/24 1135 09/04/24 0538  WBC 15.6* 8.6 8.4  NEUTROABS 12.0*  --   --  HGB 14.6 13.2 11.8*  HCT 44.5 41.3 36.3  MCV 90.1 91.8 90.8  PLT 289 202 179   Basic Metabolic Panel: Recent Labs  Lab 09/02/24 0222 09/03/24 1135 09/04/24 0538 09/05/24 0400  NA 138 144 141 138  K 3.9 3.4* 3.4* 3.5  CL 101 107 105 104  CO2 19* 26 25 24   GLUCOSE 180* 106* 124* 124*  BUN 16 15 11  7*  CREATININE 0.71 0.61 0.54 0.49  CALCIUM  10.4* 9.1 8.7* 8.7*  MG  --  1.9  --   --    GFR: Estimated Creatinine Clearance: 37 mL/min  (by C-G formula based on SCr of 0.49 mg/dL). Liver Function Tests: Recent Labs  Lab 09/02/24 0222 09/03/24 1135  AST 22 18  ALT 10 6  ALKPHOS 155* 116  BILITOT 0.6 0.7  PROT 8.0 6.2*  ALBUMIN  4.4 3.4*   Recent Labs  Lab 09/02/24 0222  LIPASE 11   No results for input(s): AMMONIA in the last 168 hours. Coagulation Profile: No results for input(s): INR, PROTIME in the last 168 hours. Cardiac Enzymes: No results for input(s): CKTOTAL, CKMB, CKMBINDEX, TROPONINI in the last 168 hours. BNP (last 3 results) No results for input(s): PROBNP in the last 8760 hours. HbA1C: No results for input(s): HGBA1C in the last 72 hours.  CBG: Recent Labs  Lab 09/04/24 1757 09/04/24 2011 09/05/24 0031 09/05/24 0629 09/05/24 1227  GLUCAP 139* 184* 118* 120* 222*   Lipid Profile: No results for input(s): CHOL, HDL, LDLCALC, TRIG, CHOLHDL, LDLDIRECT in the last 72 hours. Thyroid  Function Tests: No results for input(s): TSH, T4TOTAL, FREET4, T3FREE, THYROIDAB in the last 72 hours. Anemia Panel: Recent Labs    09/05/24 0400  VITAMINB12 258   Sepsis Labs: Recent Labs  Lab 09/02/24 0316 09/02/24 0555  LATICACIDVEN 2.7* 1.6    No results found for this or any previous visit (from the past 240 hours).       Radiology Studies: No results found.       Scheduled Meds:  budesonide  (PULMICORT ) nebulizer solution  0.25 mg Nebulization BID   [START ON 09/06/2024] cyanocobalamin   500 mcg Oral Daily   docusate sodium   100 mg Oral BID   enoxaparin  (LOVENOX ) injection  40 mg Subcutaneous QHS   [START ON 09/06/2024] feeding supplement  237 mL Oral BID BM   insulin  aspart  0-6 Units Subcutaneous Q6H   lidocaine   1 patch Transdermal Q24H   pantoprazole  (PROTONIX ) IV  40 mg Intravenous Q12H   Continuous Infusions:     LOS: 3 days    Time spent: 35 Minutes    Ashdon Gillson A Wai Minotti, MD [facility]   If 7PM-7AM, please contact  night-coverage [email or web address]  09/05/2024, 2:44 PM   "

## 2024-09-05 NOTE — Plan of Care (Signed)
" °  Problem: Coping: Goal: Ability to adjust to condition or change in health will improve Outcome: Progressing   Problem: Fluid Volume: Goal: Ability to maintain a balanced intake and output will improve Outcome: Progressing   Problem: Nutrition: Goal: Adequate nutrition will be maintained Outcome: Progressing   Problem: Coping: Goal: Level of anxiety will decrease Outcome: Progressing   "

## 2024-09-05 NOTE — TOC Progression Note (Addendum)
 Transition of Care [facility]) - Progression Note    Patient Details  Name: Rhonda Watkins MRN: [id number] Date of Birth: [birth year]  Transition of [facility]) CM/SW Contact  Kaan Tosh LITTIE Moose, [place] Phone Number: 09/05/2024, 3:17 PM  Clinical Narrative:    Pt admitted from [place] at [facility] [place]. Pt currently Ox2- CSW contacted pt son, [name] and completed SNF workup over the phone. [name] is agreeable to SNF fllowing hospital DC and stated pt has previously been to [facility] and would prefer not to go back. [name] stated he lives in [place] and his brother lives in [place] and would prefer a facility between [place] and [place]. CSW completed Fl2 and sent out SNF referrals. CSW will follow up and provide bed offers. Scott provided email to send bed offers to once CSW has them: [email or web address]     Barriers to Discharge: Continued Medical Work up               Expected Discharge Plan and Services                                               Social Drivers of Health (SDOH) Interventions SDOH Screenings   Food Insecurity: Patient Unable To Answer (09/02/2024)  Housing: Patient Unable To Answer (09/02/2024)  Transportation Needs: Patient Unable To Answer (09/02/2024)  Utilities: Patient Unable To Answer (09/02/2024)  Social Connections: Patient Unable To Answer (09/02/2024)  Tobacco Use: Medium Risk (09/02/2024)    Readmission Risk Interventions     No data to display

## 2024-09-05 NOTE — Progress Notes (Signed)
 "  Trauma/Critical Care Follow Up Note  Subjective:    Overnight Issues:   Objective:  Vital signs for last 24 hours: Temp:  [97.5 F (36.4 C)-98.6 F (37 C)] 97.5 F (36.4 C) (01/26 0623) Pulse Rate:  [67-83] 67 (01/26 0623) Resp:  [18-19] 18 (01/26 0623) BP: (118-146)/(62-116) 118/81 (01/26 0623) SpO2:  [100 %] 100 % (01/26 0623) Weight:  [56 kg] 56 kg (01/26 0500)  Intake/Output from previous day: 01/25 0701 - 01/26 0700 In: 120 [P.O.:120] Out: -   Intake/Output this shift: No intake/output data recorded.  Vent settings for last 24 hours:    Physical Exam:  Gen: comfortable, no distress Neuro: follows commands, alert, communicative HEENT: PERRL Neck: supple CV: RRR Pulm: unlabored breathing on RA Abd: soft, NT  , +BM per patient report, none documented GU: urine clear and yellow, +spontaneous voids Extr: wwp, no edema  Results for orders placed or performed during the hospital encounter of 09/02/24 (from the past 24 hours)  Glucose, capillary     Status: Abnormal   Collection Time: 09/04/24 12:49 PM  Result Value Ref Range   Glucose-Capillary 153 (H) 70 - 99 mg/dL  Glucose, capillary     Status: Abnormal   Collection Time: 09/04/24  4:32 PM  Result Value Ref Range   Glucose-Capillary 168 (H) 70 - 99 mg/dL  Glucose, capillary     Status: Abnormal   Collection Time: 09/04/24  5:57 PM  Result Value Ref Range   Glucose-Capillary 139 (H) 70 - 99 mg/dL  Glucose, capillary     Status: Abnormal   Collection Time: 09/04/24  8:11 PM  Result Value Ref Range   Glucose-Capillary 184 (H) 70 - 99 mg/dL  Glucose, capillary     Status: Abnormal   Collection Time: 09/05/24 12:31 AM  Result Value Ref Range   Glucose-Capillary 118 (H) 70 - 99 mg/dL  Basic metabolic panel     Status: Abnormal   Collection Time: 09/05/24  4:00 AM  Result Value Ref Range   Sodium 138 135 - 145 mmol/L   Potassium 3.5 3.5 - 5.1 mmol/L   Chloride 104 98 - 111 mmol/L   CO2 24 22 - 32 mmol/L    Glucose, Bld 124 (H) 70 - 99 mg/dL   BUN 7 (L) 8 - 23 mg/dL   Creatinine, Ser 9.50 0.44 - 1.00 mg/dL   Calcium  8.7 (L) 8.9 - 10.3 mg/dL   GFR, Estimated >39 >39 mL/min   Anion gap 10 5 - 15  Vitamin B12     Status: None   Collection Time: 09/05/24  4:00 AM  Result Value Ref Range   Vitamin B-12 258 180 - 914 pg/mL  Glucose, capillary     Status: Abnormal   Collection Time: 09/05/24  6:29 AM  Result Value Ref Range   Glucose-Capillary 120 (H) 70 - 99 mg/dL    Assessment & Plan:  LOS: 3 days   Additional comments:I reviewed the patient's new clinical lab test results.   and I reviewed the patients new imaging test results.    SBO, likely adhesive due to history of previous abdominal surgeries  Tol full liquids - advance as tolerated No surgical indication for ongoing antibiotics   Dilated CBD on imaging - s/p remote cholecystectomy.  Normal LFT's.  No need for further work-up.  Medically stable for discharge from surgical perspective. Surgery team to sign off.    Dreama GEANNIE Hanger, MD Trauma & General Surgery Please use [email or web address] to  contact on call provider  09/05/2024  *Care during the described time interval was provided by me. I have reviewed this patient's available data, including medical history, events of note, physical examination and test results as part of my evaluation.    "

## 2024-09-05 NOTE — Progress Notes (Signed)
 Patient has done well on full liquids today. No c/o n/v or abd pain.

## 2024-09-06 LAB — GLUCOSE, CAPILLARY
Glucose-Capillary: 112 mg/dL — ABNORMAL HIGH (ref 70–99)
Glucose-Capillary: 131 mg/dL — ABNORMAL HIGH (ref 70–99)
Glucose-Capillary: 158 mg/dL — ABNORMAL HIGH (ref 70–99)
Glucose-Capillary: 172 mg/dL — ABNORMAL HIGH (ref 70–99)
Glucose-Capillary: 180 mg/dL — ABNORMAL HIGH (ref 70–99)

## 2024-09-06 MED ORDER — PANTOPRAZOLE SODIUM 40 MG PO TBEC: 40.0000 mg | DELAYED_RELEASE_TABLET | Freq: Every day | ORAL | 0 refills | Status: DC

## 2024-09-06 MED ORDER — POLYETHYLENE GLYCOL 3350 17 G PO PACK: 17.0000 g | PACK | Freq: Every day | ORAL | 0 refills | Status: AC

## 2024-09-06 MED ORDER — LIDOCAINE 5 % EX PTCH: 1.0000 | MEDICATED_PATCH | CUTANEOUS | 0 refills | Status: AC

## 2024-09-06 MED ORDER — POLYETHYLENE GLYCOL 3350 17 G PO PACK
17.0000 g | PACK | Freq: Every day | ORAL | Status: DC
  Administered 2024-09-06 – 2024-09-07 (×2): 17 g via ORAL
  Filled 2024-09-06 (×2): qty 1

## 2024-09-06 MED ORDER — CYANOCOBALAMIN 500 MCG PO TABS: 500.0000 ug | ORAL_TABLET | Freq: Every day | ORAL | 0 refills | Status: AC

## 2024-09-06 MED ORDER — LANTUS SOLOSTAR 100 UNIT/ML ~~LOC~~ SOPN: 5.0000 [IU] | PEN_INJECTOR | Freq: Every day | SUBCUTANEOUS | 11 refills | Status: DC

## 2024-09-06 NOTE — Progress Notes (Signed)
 Mobility Specialist Progress Note:   09/06/24 1235  Mobility  Activity Pivoted/transferred from chair to bed  Level of Assistance Minimal assist, patient does 75% or more  Assistive Device Front wheel walker  Distance Ambulated (ft) 5 ft  Activity Response Tolerated well  Mobility Referral Yes  Mobility visit 1 Mobility  Mobility Specialist Start Time (ACUTE ONLY) 1222  Mobility Specialist Stop Time (ACUTE ONLY) 1235  Mobility Specialist Time Calculation (min) (ACUTE ONLY) 13 min   Received pt in chair and agreeable to mobility. Pt required MinA STS, otherwise MinG. No c/o. Left pt in bed with alarm on. Personal belongings and call light within reach. All needs met.  Lavanda Pollack Mobility Specialist  Please contact via [employer] or  Rehab Office [phone]

## 2024-09-06 NOTE — Progress Notes (Signed)
 Physical Therapy Treatment Patient Details Name: Rhonda Watkins MRN: [id number] DOB: 1932-01-22 Today's Date: 09/06/2024   History of Present Illness 89 y.o. female presents to [facility] hospital on 09/02/2024 with abdominal pain and emesis. Imaging notable for SBO. PMH includes DM, HTN, HLD, asthma, GERD, cholecystectomy.    PT Comments  Currently pt is Min A for bed mobility, sit to stand and CGA to Min A for transfers with Min A for AD management and CGA for balance with heavy reliance on RW during short distance gait and transfer. Pt is limited due to weakness, pain and balance deficits. Pt has little to no assistance at home from family. Due to pt current functional status, home set up and available assistance at home recommending skilled physical therapy services < 3 hours/day in order to address strength, balance and functional mobility to decrease risk for falls, injury, immobility, skin break down and re-hospitalization.      If plan is discharge home, recommend the following: A little help with walking and/or transfers;Assistance with cooking/housework;Assist for transportation;Supervision due to cognitive status     Equipment Recommendations  None recommended by PT       Precautions / Restrictions Precautions Precautions: Fall Recall of Precautions/Restrictions: Impaired Restrictions Weight Bearing Restrictions Per Provider Order: No     Mobility  Bed Mobility Overal bed mobility: Needs Assistance Bed Mobility: Supine to Sit     Supine to sit: Min assist, HOB elevated     General bed mobility comments: Min A with LE to EOB    Transfers Overall transfer level: Needs assistance Equipment used: Rolling walker (2 wheels) Transfers: Sit to/from Stand, Bed to chair/wheelchair/BSC Sit to Stand: Min assist   Step pivot transfers: Min assist       General transfer comment: Min A for initial momentum to get to standing and for managing RW with significant increase in time.     Ambulation/Gait Ambulation/Gait assistance: Contact guard assist, Min assist Gait Distance (Feet): 5 Feet Assistive device: Rolling walker (2 wheels) Gait Pattern/deviations: Step-to pattern, Trunk flexed Gait velocity: reduced Gait velocity interpretation: <1.31 ft/sec, indicative of household ambulator   General Gait Details: short shuffling steps from bed to recliner, Min a to navigate RW      Balance Overall balance assessment: Needs assistance Sitting-balance support: No upper extremity supported, Feet supported Sitting balance-Leahy Scale: Good     Standing balance support: Single extremity supported, Reliant on assistive device for balance Standing balance-Leahy Scale: Fair Standing balance comment: Pt stood with assist but requires RW for balance for ~ 5 min due to incontinence and clean up      Communication Communication Communication: Impaired Factors Affecting Communication: Hearing impaired  Cognition Arousal: Alert Behavior During Therapy: Anxious   PT - Cognitive impairments: Awareness, Memory, Problem solving, Safety/Judgement   Following commands: Intact      Cueing Cueing Techniques: Verbal cues, Visual cues, Tactile cues     General Comments General comments (skin integrity, edema, etc.): no signs/symptoms of cardiac/respiratory distress      Pertinent Vitals/Pain Pain Assessment Pain Assessment: Faces Faces Pain Scale: Hurts little more Breathing: normal Negative Vocalization: none Facial Expression: facial grimacing Body Language: relaxed Consolability: no need to console PAINAD Score: 2 Pain Location: L hip Pain Descriptors / Indicators: Sore Pain Intervention(s): Monitored during session           PT Goals (current goals can now be found in the care plan section) Acute Rehab PT Goals Patient Stated Goal: to reduce falls  risk PT Goal Formulation: With patient Time For Goal Achievement: 09/18/24 Potential to Achieve Goals:  Fair Progress towards PT goals: Progressing toward goals    Frequency    Min 2X/week      PT Plan  Continue with current POC        AM-PAC PT 6 Clicks Mobility   Outcome Measure  Help needed turning from your back to your side while in a flat bed without using bedrails?: A Little Help needed moving from lying on your back to sitting on the side of a flat bed without using bedrails?: A Little Help needed moving to and from a bed to a chair (including a wheelchair)?: A Little Help needed standing up from a chair using your arms (e.g., wheelchair or bedside chair)?: A Little Help needed to walk in hospital room?: Total Help needed climbing 3-5 steps with a railing? : Total 6 Click Score: 14    End of Session Equipment Utilized During Treatment: Gait belt Activity Tolerance: Patient tolerated treatment well Patient left: in chair;with call bell/phone within reach;with chair alarm set Nurse Communication: Mobility status PT Visit Diagnosis: Other abnormalities of gait and mobility (R26.89);Muscle weakness (generalized) (M62.81)     Time: 8991-8962 PT Time Calculation (min) (ACUTE ONLY): 29 min  Charges:    $Therapeutic Activity: 23-37 mins PT General Charges $$ ACUTE PT VISIT: 1 Visit                     Dorothyann Maier, DPT, CLT  Acute Rehabilitation Services Office: [phone] (Secure chat preferred)    Dorothyann VEAR Maier 09/06/2024, 10:42 AM

## 2024-09-06 NOTE — Discharge Summary (Signed)
 " Physician Discharge Summary   Patient: Rhonda Watkins MRN: [id number] DOB: Jun 07, 1932  Admit date:     09/02/2024  Discharge date: 09/06/24  Discharge Physician: Owen DELENA Lore   PCP: Charlott Dorn DELENA, MD   Recommendations at discharge:    Monitor CBG.  Needs referral to Neurology for evaluation of Dementia.   Discharge Diagnoses: Principal Problem:   SBO (small bowel obstruction) (HCC)  Resolved Problems:   * No resolved hospital problems. [facility] Course: 89 year old with past medical history significant for diabetes, hypertension, hyperlipidemia, asthma, GERD, prior history of SBO status post exploratory laparotomy lysis of adhesion for 90 minutes 10/2022, previous cholecystectomy, colon surgery, hernia repair, presents complaining of abdominal pain nausea vomiting.  CT imaging concerning for SBO. General surgery consulted and following.  Patient was started on a small bowel protocol.   Assessment and Plan: 1-SBO: - Presents with abdominal pain, nausea vomiting.  CT scan concerning for SBO, chronic changes unclear if contained perforation or infection. - Protonix  twice daily while in the hospital.  - Appreciate general surgery evaluation -KUB resolved SBO -Discontinue IV antibiotics, per Surgery recommendations.  -NG tube removed.  -Had BM Tolerating soft diet. Will add Miralax . She is stable to be transfer to rehab.    Lactic acidosis: - In the setting of hypovolemia, SBO, also concern for infection. - Treated  with IV fluids Resolved.    Leukocytosis: - In the setting of SBO and or concern for infection. - Trending down.    Asthma: - As needed albuterol  - Treated with  Pulmicort  while in the hospital--Resume  fluticasone  inhaler at discharge.    Diabetes type 2 - Scale insulin  -Hold  Lantus , she has not required significant amount of insulin .   Concern for CBD stone, dilation:  -Not significantly elevated Alk phosphatase.  -US : Dilated distal  common bile duct without visualization of obstructing gallstone. Per general surgery no need to proceed with MRCP, liver function test trending down   Hypokalemia; Replace.d    4 mm left lower lobe pulmonary nodule. FU Out patient.    Memory problems; per son patient has been having memory problem. She will need neurology evaluation out patient.    Neuropathy;  B 12. Low normal. Started B 12  supplement.    Left hip pain. X ray negative. Continue with lidocaine  patch.   Unclear why Xarelto was on her medication list, reviewed record from PCP no mention. Could not found history for A fib or DVT. The only reason I could found was for DVT prophylaxis but is not mention . Due to her age 89, risk for fall, will hold it for now.           Consultants: Surgery  Procedures performed: None Disposition: Skilled nursing facility Diet recommendation:  Diet; soft diet.  DISCHARGE MEDICATION: Allergies as of 09/06/2024       Reactions   Chocolate Diarrhea   Only in excess quantities   Jardiance [empagliflozin] Other (See Comments)   Fatigue    Metformin And Related Other (See Comments)   Fatigue    Tradjenta [linagliptin] Other (See Comments)   Unknown reaction        Medication List     STOP taking these medications    Lantus  SoloStar 100 UNIT/ML Solostar Pen Generic drug: insulin  glargine   Xarelto 20 MG Tabs tablet Generic drug: rivaroxaban       TAKE these medications    acetaminophen  500 MG tablet Commonly known as: TYLENOL  Take  500 mg by mouth every 8 (eight) hours as needed for mild pain (pain score 1-3) or moderate pain (pain score 4-6).   albuterol  108 (90 Base) MCG/ACT inhaler Commonly known as: VENTOLIN  HFA Inhale 1-2 puffs into the lungs every 6 (six) hours as needed for wheezing or shortness of breath.   amoxicillin  500 MG capsule Commonly known as: AMOXIL  Take 500 mg by mouth every 8 (eight) hours.   Arnuity Ellipta  100 MCG/ACT Aepb Generic drug:  Fluticasone  Furoate Inhale 1 puff into the lungs daily.   atorvastatin  10 MG tablet Commonly known as: LIPITOR Take 10 mg by mouth daily.   cyanocobalamin  500 MCG tablet Commonly known as: VITAMIN B12 Take 1 tablet (500 mcg total) by mouth daily. Start taking on: September 07, 2024   docusate sodium  100 MG capsule Commonly known as: COLACE Take 1 capsule (100 mg total) by mouth 2 (two) times daily. What changed:  when to take this reasons to take this   DULoxetine 30 MG capsule Commonly known as: CYMBALTA Take 30 mg by mouth daily.   fluticasone  50 MCG/ACT nasal spray Commonly known as: FLONASE  Place 2 sprays into both nostrils daily as needed for allergies or rhinitis.   furosemide  40 MG tablet Commonly known as: LASIX  Take 40 mg by mouth daily.   lidocaine  5 % Commonly known as: LIDODERM  Place 1 patch onto the skin daily. Remove & Discard patch within 12 hours or as directed by MD   omeprazole  20 MG capsule Commonly known as: PRILOSEC  Take 1 capsule (20 mg total) by mouth daily.   oxybutynin 5 MG tablet Commonly known as: DITROPAN Take 5 mg by mouth 2 (two) times daily.   pantoprazole  40 MG tablet Commonly known as: Protonix  Take 1 tablet (40 mg total) by mouth daily.   polyethylene glycol 17 g packet Commonly known as: MIRALAX  / GLYCOLAX  Take 17 g by mouth daily. Start taking on: September 07, 2024 What changed:  when to take this reasons to take this        Discharge Exam: Filed Weights   09/04/24 0500 09/05/24 0500 09/06/24 0500  Weight: 55.1 kg 56 kg 58.4 kg   General NAD Condition at discharge: stable  The results of significant diagnostics from this hospitalization (including imaging, microbiology, ancillary and laboratory) are listed below for reference.   Imaging Studies: DG HIP UNILAT WITH PELVIS 2-3 VIEWS LEFT Result Date: 09/05/2024 CLINICAL DATA:  [id number] Pain [id number] EXAM: DG HIP (WITH OR WITHOUT PELVIS) 2-3V LEFT COMPARISON:   07/13/2023. FINDINGS: Pelvis is intact with normal and symmetric sacroiliac joints. No acute fracture or dislocation. No aggressive osseous lesion. Visualized sacral arcuate lines are unremarkable. Unremarkable symphysis pubis. There are mild degenerative changes of bilateral hip joints without significant joint space narrowing. Osteophytosis of the superior acetabulum. No radiopaque foreign bodies. IMPRESSION: No acute osseous abnormality of the pelvis or left hip joint. Electronically Signed   By: Ree Molt M.D.   On: 09/05/2024 16:01   DG Abd Portable 1V-Small Bowel Obstruction Protocol-initial, 8 hr delay Result Date: 09/03/2024 EXAM: 1 VIEW XRAY OF THE ABDOMEN 09/03/2024 03:00:00 AM COMPARISON: 09/02/2024 CLINICAL HISTORY: FINDINGS: LINES, TUBES AND DEVICES: Enteric tube in gastric body. BOWEL: Administered oral contrast opacifies numerous loops of nondistended small bowel, and fills the nondilated colon extending into the rectosigmoid colon. The findings are in keeping with a resolved small bowel obstruction. No free intraperitoneal gas. SOFT TISSUES: Surgical clips in right upper quadrant, consistent with prior cholecystectomy. No abnormal calcifications.  BONES: Postoperative changes in lower lumbar spine. No acute fracture. IMPRESSION: 1. Findings consistent with resolved small bowel obstruction. 2. Enteric tube terminates in the gastric body. 3. Prior cholecystectomy and postoperative changes in the lower lumbar spine. Electronically signed by: Dorethia Molt MD 09/03/2024 03:52 AM EST RP Workstation: [id number]   US  Abdomen Limited RUQ (LIVER/GB) Result Date: 09/02/2024 CLINICAL DATA:  History of prior cholecystectomy sent to evaluate for choledocholithiasis. EXAM: ULTRASOUND ABDOMEN LIMITED RIGHT UPPER QUADRANT COMPARISON:  None Available. FINDINGS: Gallbladder: The gallbladder is surgically absent. Common bile duct: Diameter: 7.98 mm (9.87 mm distally) Liver: No focal lesion identified. There  is evidence of intrahepatic biliary dilatation. Diffusely decreased echogenicity of the liver parenchyma is noted. Portal vein is patent on color Doppler imaging with normal direction of blood flow towards the liver. Other: The study is technically limited secondary to the patient's body habitus and inability to follow commands during the examination, as per the ultrasound technologist. IMPRESSION: 1. Findings consistent with prior cholecystectomy. 2. Dilated distal common bile duct without visualization of obstructing gallstones. Further evaluation with MRCP is with if choledocholithiasis remains of clinical concern. Electronically Signed   By: Suzen Dials M.D.   On: 09/02/2024 10:59   DG Abd 1 View Result Date: 09/02/2024 CLINICAL DATA:  Nasogastric tube placement EXAM: ABDOMEN - 1 VIEW COMPARISON:  September 30, 2023 FINDINGS: Distal tip of nasogastric tube is seen in proximal stomach. IMPRESSION: Distal tip of nasogastric tube is seen in proximal stomach. Electronically Signed   By: Lynwood Landy Raddle M.D.   On: 09/02/2024 10:57   CT Angio Abd/Pel W and/or Wo Contrast Result Date: 09/02/2024 CLINICAL DATA:  Mesenteric ischemia. EXAM: CTA ABDOMEN AND PELVIS WITHOUT AND WITH CONTRAST TECHNIQUE: Multidetector CT imaging of the abdomen and pelvis was performed using the standard protocol during bolus administration of intravenous contrast. Multiplanar reconstructed images and MIPs were obtained and reviewed to evaluate the vascular anatomy. RADIATION DOSE REDUCTION: This exam was performed according to the departmental dose-optimization program which includes automated exposure control, adjustment of the mA and/or kV according to patient size and/or use of iterative reconstruction technique. CONTRAST:  75mL OMNIPAQUE  IOHEXOL  350 MG/ML SOLN COMPARISON:  Outside abdomen and pelvis CT 09/30/2023. FINDINGS: VASCULAR Aorta: Normal caliber aorta without aneurysm, dissection, vasculitis or significant stenosis.  Advanced atherosclerotic disease. Abdominal aorta is obscured in some areas by motion artifact from spinal fixation hardware. Celiac: Patent without evidence of aneurysm, dissection, vasculitis or significant stenosis. Atherosclerosis. SMA: Patent without evidence of aneurysm, dissection, vasculitis or significant stenosis. Atherosclerosis. Renals: Both renal arteries patent with moderate to advanced atherosclerotic calcified plaque in the ostial region bilaterally. IMA: Obscured by beam hardening artifact proximally. Inflow: Patent without evidence of aneurysm, dissection, vasculitis or significant stenosis. Proximal Outflow: Atherosclerotic disease. Veins: No obvious venous abnormality within the limitations of this arterial phase study. Review of the MIP images confirms the above findings. NON-VASCULAR Lower chest: 4 mm left lower lobe pulmonary nodule on 23/8. Hepatobiliary: No suspicious focal abnormality within the liver parenchyma. A tiny hypodensity in the liver parenchyma is too small to characterize but is statistically most likely benign. No followup imaging is recommended. Gallbladder is surgically absent. Mild intrahepatic biliary duct prominence. Common bile duct measures up to 8 mm diameter. 9 mm calcification is identified in the region of the ampulla and appears to be in line with the common bile duct (see axial images 19 and 20 of series 2) raising concern for distal common bile duct stone. Pancreas: Pancreas  is diffusely atrophic with diffuse dilatation of the main pancreatic duct. See paragraph above. Spleen: No splenomegaly. No suspicious focal mass lesion. Adrenals/Urinary Tract: No adrenal nodule or mass. Right kidney unremarkable. 4.9 cm exophytic cyst upper pole left kidney has a single thin internal septation compatible with Bosniak II category. Immediately adjacent Bosniak I 2.4 cm left renal cyst. No evidence for hydroureter. Bladder is distended. Stomach/Bowel: Stomach is distended with  food fluid and gas. Duodenum is normally positioned as is the ligament of Treitz. Dilated small bowel loops in the abdomen and upper pelvis measure up to 3.4 cm diameter. The most dilated loops show ill-defined walls, subtle perienteric edema in fecalization of enteric contents. No abrupt or discrete transition zone can be identified although the small bowel appears to be tethered anteriorly up against the peritoneum just deep to the rectus sheath where there is ill-defined amorphous soft tissue in central gas evident (see coronal images 102-105 of series 11). In retrospect, previous outside CT from 09/30/2023 showed soft tissue thickening and probable granulation inter just deep to the midline rectus sheath with apparent intraluminal containing gas collection. Small bowel in the right lower quadrant appears relatively decompressed. Neither the terminal ileum nor the appendix are well visualized. Colonic diverticulosis evident without definite features of diverticulitis. Lymphatic: No abdominal lymphadenopathy. No pelvic sidewall lymphadenopathy. Reproductive: There is no adnexal mass. Other: No substantial intraperitoneal free fluid. Musculoskeletal: Bones are diffusely demineralized. Posterior spinal fusion hardware noted L3-4. IMPRESSION: 1. No evidence for abdominal aortic aneurysm or dissection. No evidence for mesenteric arterial occlusion. 2. Dilated small bowel loops in the abdomen and upper pelvis measure up to 3.4 cm diameter. The most dilated loops show ill-defined walls, subtle perienteric edema and fecalization of enteric contents. No abrupt or discrete transition zone can be identified although the small bowel appears to be tethered anteriorly up against the peritoneum just deep to the rectus sheath where there is ill-defined amorphous soft tissue with central gas evident that appears to be extraluminal but contain/loculated within the soft tissue. Previous outside CT from 09/30/2023 showed soft tissue  thickening and probable granulation just deep to the midline rectus sheath with apparent extraluminal gas collection. Imaging features are compatible with small bowel obstruction, likely secondary to adhesions and/or desmoplastic/neoplastic process in the anterior upper pelvis incorporating small bowel loops. Chronic contained perforation or neoplasm could have this appearance. 3. 9 mm calcification identified in the region of the ampulla and appears to be in line with the dilated common bile duct raising concern for distal common bile duct stone. Pancreatic parenchyma is diffusely atrophic around the dilated duct. Mild intrahepatic biliary duct prominence with common bile duct measuring up to 8 mm diameter. 4. 4 mm left lower lobe pulmonary nodule. No follow-up needed if patient is low-risk.This recommendation follows the consensus statement: Guidelines for Management of Incidental Pulmonary Nodules Detected on CT Images: From the Fleischner Society 2017; Radiology 2017; 284:228-243. 5. Colonic diverticulosis without diverticulitis. Electronically Signed   By: Camellia Candle M.D.   On: 09/02/2024 05:53   DG Lumbar Spine Complete Result Date: 08/15/2024 EXAM: 4 VIEW(S) XRAY OF THE LUMBAR SPINE 08/15/2024 03:45:00 AM COMPARISON: 09/16/2023 CLINICAL HISTORY: fall FINDINGS: LUMBAR SPINE: BONES: Vertebral body heights are maintained. Alignment is normal. Posterior fusion changes at L3-L4. DISCS AND DEGENERATIVE CHANGES: Diffuse degenerative disc and facet disease. SOFT TISSUES: Aortic atherosclerosis. IMPRESSION: 1. No evidence of acute traumatic injury. Electronically signed by: Franky Crease MD 08/15/2024 03:55 AM EST RP Workstation: [id number]   DG  Femur Min 2 Views Right Result Date: 08/15/2024 EXAM: 2 VIEW(S) XRAY OF THE RIGHT FEMUR 08/15/2024 03:45:00 AM COMPARISON: 09/13/2023 CLINICAL HISTORY: fall FINDINGS: BONES AND JOINTS: Plate and screw fixation is present in the mid to distal femur. No acute fracture,  subluxation, or dislocation. No malalignment. Tricompartmental degenerative changes are noted in the right knee. SOFT TISSUES: The soft tissues are unremarkable. IMPRESSION: 1. No acute bony abnormality. Electronically signed by: Franky Crease MD 08/15/2024 03:54 AM EST RP Workstation: [id number]   DG Knee Complete 4 Views Right Result Date: 08/15/2024 EXAM: 4 OR MORE VIEW(S) XRAY OF THE RIGHT KNEE 08/15/2024 03:45:00 AM COMPARISON: 09/13/2023 CLINICAL HISTORY: fall FINDINGS: BONES AND JOINTS: No acute fracture. No malalignment. No significant joint effusion. Plate and screw fixation noted in the distal femur. Tricompartmental degenerative changes in the right knee. SOFT TISSUES: The soft tissues are unremarkable. IMPRESSION: 1. No acute fracture or dislocation. Electronically signed by: Franky Crease MD 08/15/2024 03:53 AM EST RP Workstation: [id number]   DG Pelvis 1-2 Views Result Date: 08/15/2024 EXAM: 1 or 2 VIEW(S) XRAY OF THE PELVIS 08/15/2024 03:45:00 AM COMPARISON: Comparison 09/30/2023. CLINICAL HISTORY: fall FINDINGS: BONES AND JOINTS: Diffuse osteopenia. No acute fracture. No malalignment. SOFT TISSUES: The soft tissues are unremarkable. IMPRESSION: 1. No acute bony abnormality. Electronically signed by: Franky Crease MD 08/15/2024 03:52 AM EST RP Workstation: [id number]    Microbiology: Results for orders placed or performed during the hospital encounter of 09/12/23  Surgical PCR screen     Status: None   Collection Time: 09/12/23 10:29 PM   Specimen: Nasal Mucosa; Nasal Swab  Result Value Ref Range Status   MRSA, PCR NEGATIVE NEGATIVE Final   Staphylococcus aureus NEGATIVE NEGATIVE Final    Comment: (NOTE) The Xpert SA Assay (FDA approved for NASAL specimens in patients 83 years of age and older), is one component of a comprehensive surveillance program. It is not intended to diagnose infection nor to guide or monitor treatment. Performed at [facility], [address]  [place], [place] [address]     Labs: CBC: Recent Labs  Lab 09/02/24 0222 09/03/24 1135 09/04/24 0538  WBC 15.6* 8.6 8.4  NEUTROABS 12.0*  --   --   HGB 14.6 13.2 11.8*  HCT 44.5 41.3 36.3  MCV 90.1 91.8 90.8  PLT 289 202 179   Basic Metabolic Panel: Recent Labs  Lab 09/02/24 0222 09/03/24 1135 09/04/24 0538 09/05/24 0400  NA 138 144 141 138  K 3.9 3.4* 3.4* 3.5  CL 101 107 105 104  CO2 19* 26 25 24   GLUCOSE 180* 106* 124* 124*  BUN 16 15 11  7*  CREATININE 0.71 0.61 0.54 0.49  CALCIUM  10.4* 9.1 8.7* 8.7*  MG  --  1.9  --   --    Liver Function Tests: Recent Labs  Lab 09/02/24 0222 09/03/24 1135  AST 22 18  ALT 10 6  ALKPHOS 155* 116  BILITOT 0.6 0.7  PROT 8.0 6.2*  ALBUMIN  4.4 3.4*   CBG: Recent Labs  Lab 09/05/24 0629 09/05/24 1227 09/05/24 1728 09/06/24 0000 09/06/24 0605  GLUCAP 120* 222* 131* 180* 112*    Discharge time spent: greater than 30 minutes.  Signed: Owen DELENA Lore, MD [facility] 09/06/2024 "

## 2024-09-06 NOTE — TOC Progression Note (Addendum)
 Transition of Care [facility]) - Progression Note    Patient Details  Name: Rhonda Watkins MRN: [id number] Date of Birth: 03-07-1932  [facility]) CM/SW Contact  Lynell Greenhouse LITTIE Moose, [place] Phone Number: 09/06/2024, 10:19 AM  Clinical Narrative:    CSW sent pt son, [name], SNF bed offers including Medicare ratings to the email he provided yesterday. CSW will follow up on facility choice.      Barriers to Discharge: Continued Medical Work up               Expected Discharge Plan and Services         Expected Discharge Date: 09/06/24                                     Social Drivers of Health (SDOH) Interventions SDOH Screenings   Food Insecurity: Patient Unable To Answer (09/02/2024)  Housing: Patient Unable To Answer (09/02/2024)  Transportation Needs: Patient Unable To Answer (09/02/2024)  Utilities: Patient Unable To Answer (09/02/2024)  Social Connections: Patient Unable To Answer (09/02/2024)  Tobacco Use: Medium Risk (09/02/2024)    Readmission Risk Interventions     No data to display

## 2024-09-06 NOTE — Plan of Care (Signed)

## 2024-09-07 LAB — GLUCOSE, CAPILLARY
Glucose-Capillary: 146 mg/dL — ABNORMAL HIGH (ref 70–99)
Glucose-Capillary: 178 mg/dL — ABNORMAL HIGH (ref 70–99)
Glucose-Capillary: 227 mg/dL — ABNORMAL HIGH (ref 70–99)

## 2024-09-07 MED ORDER — PANTOPRAZOLE SODIUM 40 MG PO TBEC
40.0000 mg | DELAYED_RELEASE_TABLET | Freq: Two times a day (BID) | ORAL | Status: DC
  Administered 2024-09-07 – 2024-09-08 (×3): 40 mg via ORAL
  Filled 2024-09-07 (×3): qty 1

## 2024-09-07 NOTE — Plan of Care (Signed)
   Problem: Coping: Goal: Ability to adjust to condition or change in health will improve Outcome: Progressing

## 2024-09-07 NOTE — TOC Progression Note (Signed)
 Transition of Care [facility]) - Progression Note    Patient Details  Name: Rhonda Watkins MRN: [id number] Date of Birth: 1932/02/24  Transition of Care [facility]) CM/SW Contact  Brilynn Biasi LITTIE Moose, [place] Phone Number: 09/07/2024, 10:38 AM  Clinical Narrative:    CSW received email response from pt son, [name]. He stated he would like to go with [name] for SNF following pt hospital DC. CSW initiated insurance auth for [employer], ref# [id number]. CSW will continue to follow.     Barriers to Discharge: Continued Medical Work up               Expected Discharge Plan and Services         Expected Discharge Date: 09/06/24                                     Social Drivers of Health (SDOH) Interventions SDOH Screenings   Food Insecurity: Patient Unable To Answer (09/02/2024)  Housing: Patient Unable To Answer (09/02/2024)  Transportation Needs: Patient Unable To Answer (09/02/2024)  Utilities: Patient Unable To Answer (09/02/2024)  Social Connections: Patient Unable To Answer (09/02/2024)  Tobacco Use: Medium Risk (09/02/2024)    Readmission Risk Interventions     No data to display

## 2024-09-07 NOTE — Progress Notes (Signed)
 " PROGRESS NOTE    Rhonda Watkins  FMW:[id number] DOB: Oct 31, 1931 DOA: 09/02/2024 PCP: Charlott Dorn LABOR, MD    Brief Narrative:  89 year old with past medical history significant for diabetes, hypertension, hyperlipidemia, asthma, GERD, prior history of SBO status post exploratory laparotomy lysis of adhesion for 90 minutes 10/2022, previous cholecystectomy, colon surgery, hernia repair, presents complaining of abdominal pain nausea vomiting.  CT imaging concerning for SBO. General surgery consulted and following.  Patient was started on a small bowel protocol. SBO noted to be resolved on 1/24 with NG tube removal on 1/25. Tolerated diet advancements. Patient was deemed medically ready for discharge on 1/27. Pending SNF bed offers.   Assessment and Plan:  # SBO - resolved - NG tube removed on 1/25 - Tolerating soft diet without N/V  # Lactic acidosis - resolved  # Leukocytosis - resovled   # Asthma - continue PRN albuterol  and Pulmicort   # T2DM - continue SSI  # CBD dilation - s/p remote cholecystectomy, evaluated by general surgery, no further workup given normal LFTs  # Hypokalemia - resolved  # LLL pulmonary nodule -  4 mm; outpatient follow up  # Memory issues - reported by patient's son. Recommend outpatient neurology evaluation  # Neuropathy - noted to have low normal B12 levels, continue supplementation  # Left hip pain - Xray unremarkable, continue lidocaine  patch   Scheduled Meds:  budesonide  (PULMICORT ) nebulizer solution  0.25 mg Nebulization BID   cyanocobalamin   500 mcg Oral Daily   docusate sodium   100 mg Oral BID   enoxaparin  (LOVENOX ) injection  40 mg Subcutaneous QHS   feeding supplement  237 mL Oral BID BM   insulin  aspart  0-6 Units Subcutaneous Q6H   lidocaine   1 patch Transdermal Q24H   pantoprazole   40 mg Oral BID   polyethylene glycol  17 g Oral Daily   Continuous Infusions: PRN Meds:.acetaminophen  **OR** acetaminophen , albuterol ,  fluticasone , hydrALAZINE , HYDROmorphone  (DILAUDID ) injection, ondansetron  (ZOFRAN ) IV  Current Outpatient Medications  Medication Instructions   acetaminophen  (TYLENOL ) 500 mg, Oral, Every 8 hours PRN   albuterol  (VENTOLIN  HFA) 108 (90 Base) MCG/ACT inhaler 1-2 puffs, Inhalation, Every 6 hours PRN   amoxicillin  (AMOXIL ) 500 mg, Every 8 hours   atorvastatin  (LIPITOR) 10 mg, Oral, Daily   cyanocobalamin  (VITAMIN B12) 500 mcg, Oral, Daily   docusate sodium  (COLACE) 100 mg, Oral, 2 times daily   DULoxetine (CYMBALTA) 30 mg, Oral, Daily   fluticasone  (FLONASE ) 50 MCG/ACT nasal spray 2 sprays, Each Nare, Daily PRN   Fluticasone  Furoate (ARNUITY ELLIPTA ) 100 MCG/ACT AEPB 1 puff, Inhalation, Daily   furosemide  (LASIX ) 40 mg, Oral, Daily   lidocaine  (LIDODERM ) 5 % 1 patch, Transdermal, Every 24 hours, Remove & Discard patch within 12 hours or as directed by MD   omeprazole  (PRILOSEC ) 20 mg, Oral, Daily   oxybutynin (DITROPAN) 5 mg, Oral, 2 times daily   polyethylene glycol (MIRALAX  / GLYCOLAX ) 17 g, Oral, Daily PRN   polyethylene glycol (MIRALAX  / GLYCOLAX ) 17 g, Oral, Daily   Xarelto 20 mg, Oral, Daily    DVT prophylaxis: enoxaparin  (LOVENOX ) injection 40 mg Start: 09/03/24 2200 SCDs Start: 09/02/24 0824 SCDs Start: 09/02/24 0649   Code Status:   Code Status: Limited: Do not attempt resuscitation (DNR) -DNR-LIMITED -Do Not Intubate/DNI   Family Communication: None  Disposition Plan: SNF pending insurance authorization PT - Follow Up Recommendations: Skilled nursing-short term rehab (<3 hours/day) - PT equipment: None recommended by PT OT - Follow Up Recommendations:  Skilled nursing-short term rehab (<3 hours/day) -    Level of care: Med-Surg  Consultants:  General surgery  Procedures:  None  Antimicrobials: None   Subjective: Examined at bedside. Reports doing well. Has some hip and lower extremity pain, likely from position laying in bed. No nausea, vomiting, or abdominal  pain.   Objective: Vitals:   09/07/24 0934 09/07/24 1747 09/07/24 2045 09/07/24 2103  BP: (!) 119/58 (!) 151/58 (!) 161/68   Pulse: 73 79 79   Resp: 16 16 15    Temp: 98.2 F (36.8 C) 99.8 F (37.7 C) 98.6 F (37 C)   TempSrc: Oral Oral Oral   SpO2: 99% 100% 99% 99%  Weight:      Height:       No intake or output data in the 24 hours ending 09/07/24 2322 Filed Weights   09/05/24 0500 09/06/24 0500 09/07/24 0500  Weight: 56 kg 58.4 kg 60 kg    Examination:  Gen: NAD, A&Ox3 HEENT: NCAT Neck: Supple CV: RRR, no murmurs Resp: normal WOB, CTAB, no w/r/r Abd: Soft, NTND, no guarding, BS normoactive Ext: No LE edema Skin: Warm, dry Neuro: No focal deficits Psych: Calm, cooperative, appropriate affect    Data Reviewed: I have personally reviewed following labs and imaging studies  CBC: Recent Labs  Lab 09/02/24 0222 09/03/24 1135 09/04/24 0538  WBC 15.6* 8.6 8.4  NEUTROABS 12.0*  --   --   HGB 14.6 13.2 11.8*  HCT 44.5 41.3 36.3  MCV 90.1 91.8 90.8  PLT 289 202 179   Basic Metabolic Panel: Recent Labs  Lab 09/02/24 0222 09/03/24 1135 09/04/24 0538 09/05/24 0400  NA 138 144 141 138  K 3.9 3.4* 3.4* 3.5  CL 101 107 105 104  CO2 19* 26 25 24   GLUCOSE 180* 106* 124* 124*  BUN 16 15 11  7*  CREATININE 0.71 0.61 0.54 0.49  CALCIUM  10.4* 9.1 8.7* 8.7*  MG  --  1.9  --   --    GFR: Estimated Creatinine Clearance: 37 mL/min (by C-G formula based on SCr of 0.49 mg/dL). Liver Function Tests: Recent Labs  Lab 09/02/24 0222 09/03/24 1135  AST 22 18  ALT 10 6  ALKPHOS 155* 116  BILITOT 0.6 0.7  PROT 8.0 6.2*  ALBUMIN  4.4 3.4*   Recent Labs  Lab 09/02/24 0222  LIPASE 11   No results for input(s): AMMONIA in the last 168 hours. Coagulation Profile: No results for input(s): INR, PROTIME in the last 168 hours. Cardiac Enzymes: No results for input(s): CKTOTAL, CKMB, CKMBINDEX, TROPONINI in the last 168 hours. BNP (last 3 results) No  results for input(s): PROBNP in the last 8760 hours. HbA1C: No results for input(s): HGBA1C in the last 72 hours. CBG: Recent Labs  Lab 09/06/24 1242 09/06/24 1718 09/06/24 2337 09/07/24 0420 09/07/24 1819  GLUCAP 158* 172* 131* 146* 227*   Lipid Profile: No results for input(s): CHOL, HDL, LDLCALC, TRIG, CHOLHDL, LDLDIRECT in the last 72 hours. Thyroid  Function Tests: No results for input(s): TSH, T4TOTAL, FREET4, T3FREE, THYROIDAB in the last 72 hours. Anemia Panel: Recent Labs    09/05/24 0400  VITAMINB12 258   Sepsis Labs: Recent Labs  Lab 09/02/24 0316 09/02/24 0555  LATICACIDVEN 2.7* 1.6    No results found for this or any previous visit (from the past 240 hours).   Radiology Studies: No results found.  Scheduled Meds:  budesonide  (PULMICORT ) nebulizer solution  0.25 mg Nebulization BID   cyanocobalamin   500  mcg Oral Daily   docusate sodium   100 mg Oral BID   enoxaparin  (LOVENOX ) injection  40 mg Subcutaneous QHS   feeding supplement  237 mL Oral BID BM   insulin  aspart  0-6 Units Subcutaneous Q6H   lidocaine   1 patch Transdermal Q24H   pantoprazole   40 mg Oral BID   polyethylene glycol  17 g Oral Daily   Continuous Infusions:   Unresulted Labs (From admission, onward)     Start     Ordered   09/09/24 0500  Creatinine, serum  (enoxaparin  (LOVENOX )    CrCl >/= 30 ml/min)  Weekly,   R     Comments: while on enoxaparin  therapy    09/02/24 0824   09/02/24 0232  Urinalysis, w/ Reflex to Culture (Infection Suspected) -Urine, Clean Catch  Once,   URGENT       Question:  Specimen Source  Answer:  Urine, Clean Catch   09/02/24 0231             LOS:  LOS: 5 days   Time Spent: 45 minutes  Tsering Leaman Al-Sultani, MD [facility]  If 7PM-7AM, please contact night-coverage  09/07/2024, 11:22 PM      "

## 2024-09-07 NOTE — Care Management Important Message (Signed)
 Important Message  Patient Details  Name: Rhonda Watkins MRN: [id number] Date of Birth: 1931/12/23   Important Message Given:  Yes - Medicare IM     Claretta Deed 09/07/2024, 3:45 PM

## 2024-09-07 NOTE — Progress Notes (Signed)
 Occupational Therapy Treatment Patient Details Name: Rhonda Watkins MRN: [id number] DOB: 25-Jun-1932 Today's Date: 09/07/2024   History of present illness 89 y.o. female presents to [facility] hospital on 09/02/2024 with abdominal pain and emesis. Imaging notable for SBO. PMH includes DM, HTN, HLD, asthma, GERD, cholecystectomy.   OT comments  Pt progressing towards OT goals. Focus of session on progressing functional mobility and increasing engagement in ADL tasks OOB. Pt engaged in toileting hygiene, LB dressing, UB dressing, and LB bathing tasks, overall requiring Max A. Pt required Min A for functional transfers this date. OT to continue to follow Pt acutely, continue per POC.       If plan is discharge home, recommend the following:  A lot of help with walking and/or transfers;A lot of help with bathing/dressing/bathroom;Assistance with cooking/housework;Assist for transportation;Supervision due to cognitive status   Equipment Recommendations  Other (comment) (defer to next venue)    Recommendations for Other Services      Precautions / Restrictions Precautions Precautions: Fall Recall of Precautions/Restrictions: Impaired Restrictions Weight Bearing Restrictions Per Provider Order: No       Mobility Bed Mobility Overal bed mobility: Needs Assistance Bed Mobility: Supine to Sit, Sit to Supine     Supine to sit: Contact guard Sit to supine: Contact guard assist   General bed mobility comments: CGA for bed mobility, cues for sequencing and increased time.    Transfers Overall transfer level: Needs assistance Equipment used: Rolling walker (2 wheels) Transfers: Sit to/from Stand Sit to Stand: Min assist           General transfer comment: Min A to stand from bed x3. Increased time and cues for trunk extension in standing. Pt with decreased activity tolerance for prolonged standing. Increased anxiety related to falling when standing.     Balance Overall balance assessment:  Needs assistance Sitting-balance support: No upper extremity supported, Feet supported Sitting balance-Leahy Scale: Good     Standing balance support: Bilateral upper extremity supported, During functional activity, Reliant on assistive device for balance Standing balance-Leahy Scale: Fair Standing balance comment: Stands with assistance                           ADL either performed or assessed with clinical judgement   ADL Overall ADL's : Needs assistance/impaired     Grooming: Set up;Oral care;Sitting       Lower Body Bathing: Maximal assistance;Sit to/from stand   Upper Body Dressing : Set up;Sitting   Lower Body Dressing: Maximal assistance;Sitting/lateral leans       Toileting- Clothing Manipulation and Hygiene: Total assistance;Sit to/from stand Toileting - Clothing Manipulation Details (indicate cue type and reason): Total A anterior and posterior peri care in standing            Extremity/Trunk Assessment Upper Extremity Assessment Upper Extremity Assessment: Generalized weakness            Vision       Perception     Praxis     Communication Communication Communication: Impaired Factors Affecting Communication: Hearing impaired   Cognition Arousal: Alert Behavior During Therapy: Lability Cognition: Cognition impaired     Awareness: Online awareness impaired Memory impairment (select all impairments): Short-term memory, Working memory, Non-declarative long-term memory Attention impairment (select first level of impairment): Sustained attention Executive functioning impairment (select all impairments): Problem solving, Reasoning OT - Cognition Comments: Memory deficits at baseline, can be tangential  Following commands: Intact        Cueing   Cueing Techniques: Verbal cues, Visual cues, Tactile cues  Exercises      Shoulder Instructions       General Comments Pt found in bed with purewick soiled  with BM. Anterior and posterior peri care completed in standing, as well as LB washing. Pt returned to bed. Pt expressing desire to not be DNR anymore, care team informed.    Pertinent Vitals/ Pain       Pain Assessment Pain Assessment: Faces Faces Pain Scale: Hurts little more Pain Location: L hip with mobility Pain Descriptors / Indicators: Discomfort Pain Intervention(s): Limited activity within patient's tolerance, Monitored during session, Repositioned  Home Living Family/patient expects to be discharged to:: Skilled nursing facility                                        Prior Functioning/Environment              Frequency  Min 2X/week        Progress Toward Goals  OT Goals(current goals can now be found in the care plan section)  Progress towards OT goals: Progressing toward goals  Acute Rehab OT Goals Patient Stated Goal: To get better OT Goal Formulation: With patient Time For Goal Achievement: 09/18/24 Potential to Achieve Goals: Good ADL Goals Pt Will Perform Lower Body Dressing: with supervision;sit to/from stand Pt Will Transfer to Toilet: with supervision;ambulating;regular height toilet Pt Will Perform Toileting - Clothing Manipulation and hygiene: with supervision;sit to/from stand  Plan      Co-evaluation                 AM-PAC OT 6 Clicks Daily Activity     Outcome Measure   Help from another person eating meals?: None Help from another person taking care of personal grooming?: A Little Help from another person toileting, which includes using toliet, bedpan, or urinal?: Total Help from another person bathing (including washing, rinsing, drying)?: A Lot Help from another person to put on and taking off regular upper body clothing?: A Little Help from another person to put on and taking off regular lower body clothing?: A Lot 6 Click Score: 15    End of Session Equipment Utilized During Treatment: Gait belt;Rolling  walker (2 wheels)  OT Visit Diagnosis: Unsteadiness on feet (R26.81);Other abnormalities of gait and mobility (R26.89);Muscle weakness (generalized) (M62.81);History of falling (Z91.81)   Activity Tolerance Patient tolerated treatment well   Patient Left in bed;with call bell/phone within reach;with bed alarm set   Nurse Communication Mobility status;Other (comment) (new need for purewick)        Time: 8550-8485 OT Time Calculation (min): 25 min  Charges: OT General Charges $OT Visit: 1 Visit OT Treatments $Self Care/Home Management : 23-37 mins  Maurilio CROME, OTR/L.  [facility] Acute Rehabilitation  Office: [phone]   Maurilio PARAS Malayla Granberry 09/07/2024, 3:59 PM

## 2024-09-08 LAB — GLUCOSE, CAPILLARY: Glucose-Capillary: 132 mg/dL — ABNORMAL HIGH (ref 70–99)

## 2024-09-08 NOTE — Plan of Care (Signed)
  Problem: Education: Goal: Ability to describe self-care measures that may prevent or decrease complications (Diabetes Survival Skills Education) will improve Outcome: Progressing   Problem: Coping: Goal: Ability to adjust to condition or change in health will improve Outcome: Progressing

## 2024-09-08 NOTE — TOC Transition Note (Addendum)
 Transition of Care [facility]) - Discharge Note   Patient Details  Name: Rhonda Watkins MRN: [id number] Date of Birth: [birth year]  [facility] CM/SW Contact:  Jeoffrey LITTIE Maranda ISRAEL Phone Number: 09/08/2024, 9:50 AM   Clinical Narrative:    Patient will DC to: [facility] Anticipated DC date: 09/08/24  Family notified: Yes Transport by: [facility]   Per MD patient ready for DC to [facility]. RN to call report prior to discharge [phone] room 137 A. RN, patient, patient's family, and facility notified of DC. Discharge Summary and FL2 sent to facility. DC packet on chart. Ambulance transport requested for patient.   CSW will sign off for now as social work intervention is no longer needed. Please consult us  again if new needs arise.     Final next level of care: Skilled Nursing Facility Barriers to Discharge: Barriers Resolved   Patient Goals and CMS Choice Patient states their goals for this hospitalization and ongoing recovery are:: SNF          Discharge Placement   Existing PASRR number confirmed : 09/08/24          Patient chooses bed at: Other - please specify in the comment section below: [facility]) Patient to be transferred to facility by: [facility] Name of family member notified: Scott Patient and family notified of of transfer: 09/08/24  Discharge Plan and Services Additional resources added to the After Visit Summary for                                       Social Drivers of Health (SDOH) Interventions SDOH Screenings   Food Insecurity: Patient Unable To Answer (09/02/2024)  Housing: Patient Unable To Answer (09/02/2024)  Transportation Needs: Patient Unable To Answer (09/02/2024)  Utilities: Patient Unable To Answer (09/02/2024)  Social Connections: Patient Unable To Answer (09/02/2024)  Tobacco Use: Medium Risk (09/02/2024)     Readmission Risk Interventions     No data to display

## 2024-09-08 NOTE — Discharge Summary (Signed)
 " Physician Discharge Summary   Patient: Rhonda Watkins MRN: [id number] DOB: 1931/09/28  Admit date:     09/02/2024  Discharge date: 09/08/24  Discharge Physician: Duffy Al-Sultani   PCP: Charlott Dorn LABOR, MD   Recommendations at discharge:   Follow up with PCP within 1-2 weeks of discharge. Repeat CBC and CMP.  Please determine whether patient needs continued anticoagulation with Xarelto, which was held on this discharge as no records of PE, DVT, or Afib could be found and unclear indication, with patient's son thinking this was left over from a prior surgery for prophylaxis Lantus  was stopped given normal blood glucose levels off insulin . Will need continued monitoring and resumption if necessary Follow up on LLL pulmonary nodule Follow up with [facility] for evaluation of memory impairment  Discharge Diagnoses: Principal Problem:   SBO (small bowel obstruction) [facility])    Hospital Course: 89 year old with past medical history significant for diabetes, hypertension, hyperlipidemia, asthma, GERD, prior history of SBO status post exploratory laparotomy lysis of adhesion for 90 minutes 10/2022, previous cholecystectomy, colon surgery, hernia repair, presents complaining of abdominal pain nausea vomiting.  CT imaging concerning for SBO. General surgery consulted.  Patient was started on a small bowel protocol. SBO noted to be resolved on 1/24 with NG tube removal on 1/25. Tolerated diet advancements. General surgery signed off. Patient was deemed medically ready for discharge on 1/27.  # Lactic acidosis - resolved   # Leukocytosis - resovled    # Asthma - continue PRN albuterol  and Pulmicort    # T2DM - not requiring much in terms of correctional insulin . Will hold Lantus  at discharge. Please continue to monitor blood glucose and resume if necessary.   # CBD dilation - s/p remote cholecystectomy, evaluated by general surgery, no further workup given normal LFTs    # Hypokalemia - resolved   # LLL pulmonary nodule -  4 mm; outpatient follow up   # Memory issues - reported by patient's son. Recommend outpatient neurology evaluation   # Neuropathy - noted to have low normal B12 levels, continue supplementation   # Left hip pain - Xray unremarkable, continue lidocaine  patch  # Patient has Xarelto listed on her medication list, was recently prescribed in 07/2024. Unclear for what indication with no documented history of Afrib, PE, or DVT. This was also confirmed with the patient's son [name] who is also uncertain about why she is on Xarelto and denies a history of the Afib, PE, and DVT. He agreed with stopping Xarelto at this time given age and increased risk for falls. Instructed to follow up with the prescriber Jama Belton, NP to determine if it needs to be resumed.       Consultants: General surgery Procedures performed: None  Disposition: Skilled nursing facility Diet recommendation:  Diet Orders (From admission, onward)     Start     Ordered   09/05/24 0802  DIET SOFT Room service appropriate? Yes; Fluid consistency: Thin  Diet effective now       Question Answer Comment  Room service appropriate? Yes   Fluid consistency: Thin      09/05/24 0801            DISCHARGE MEDICATION: Allergies as of 09/08/2024       Reactions   Chocolate Diarrhea   Only in excess quantities   Jardiance [empagliflozin] Other (See Comments)   Fatigue    Metformin And Related Other (See Comments)   Fatigue    Tradjenta [  linagliptin] Other (See Comments)   Unknown reaction        Medication List     STOP taking these medications    Lantus  SoloStar 100 UNIT/ML Solostar Pen Generic drug: insulin  glargine   Xarelto 20 MG Tabs tablet Generic drug: rivaroxaban       TAKE these medications    acetaminophen  500 MG tablet Commonly known as: TYLENOL  Take 500 mg by mouth every 8 (eight) hours as needed for mild pain (pain score 1-3) or moderate  pain (pain score 4-6).   albuterol  108 (90 Base) MCG/ACT inhaler Commonly known as: VENTOLIN  HFA Inhale 1-2 puffs into the lungs every 6 (six) hours as needed for wheezing or shortness of breath.   amoxicillin  500 MG capsule Commonly known as: AMOXIL  Take 500 mg by mouth every 8 (eight) hours.   Arnuity Ellipta  100 MCG/ACT Aepb Generic drug: Fluticasone  Furoate Inhale 1 puff into the lungs daily.   atorvastatin  10 MG tablet Commonly known as: LIPITOR Take 10 mg by mouth daily.   cyanocobalamin  500 MCG tablet Commonly known as: VITAMIN B12 Take 1 tablet (500 mcg total) by mouth daily.   docusate sodium  100 MG capsule Commonly known as: COLACE Take 1 capsule (100 mg total) by mouth 2 (two) times daily. What changed:  when to take this reasons to take this   DULoxetine 30 MG capsule Commonly known as: CYMBALTA Take 30 mg by mouth daily.   fluticasone  50 MCG/ACT nasal spray Commonly known as: FLONASE  Place 2 sprays into both nostrils daily as needed for allergies or rhinitis.   furosemide  40 MG tablet Commonly known as: LASIX  Take 40 mg by mouth daily.   lidocaine  5 % Commonly known as: LIDODERM  Place 1 patch onto the skin daily. Remove & Discard patch within 12 hours or as directed by MD   omeprazole  20 MG capsule Commonly known as: PRILOSEC  Take 1 capsule (20 mg total) by mouth daily.   oxybutynin 5 MG tablet Commonly known as: DITROPAN Take 5 mg by mouth 2 (two) times daily.   polyethylene glycol 17 g packet Commonly known as: MIRALAX  / GLYCOLAX  Take 17 g by mouth daily. What changed:  when to take this reasons to take this        Contact information for follow-up providers     Charlott Dorn LABOR, MD Follow up in 1 week(s).   Specialty: Internal Medicine Why: Post hospital discharge follow up Contact information: [address] [place] [address] [phone]         [facility]. Schedule an  appointment as soon as possible for a visit in 1 month(s).   Why: Evaluation of memory loss/dementia Contact information: [address]     [address] [place] [place]  [address] [phone]             Contact information for after-discharge care     Destination     [facility] .   Service: Skilled Nursing Contact information: [address] [place]  [address] [phone]                     Discharge Exam: Filed Weights   09/06/24 0500 09/07/24 0500 09/08/24 0422  Weight: 58.4 kg 60 kg 57.5 kg   Blood pressure (!) 124/52, pulse 72, temperature (!) 97.5 F (36.4 C), resp. rate 18, height 5' 2 (1.575 m), weight 57.5 kg, SpO2 100%.   Gen: NAD, A&Ox3 HEENT: NCAT Neck: Supple CV: RRR,  no murmurs Resp: normal WOB, CTAB, no w/r/r Abd: Soft, NTND, no guarding, BS normoactive Ext: No LE edema Skin: Warm, dry Neuro: No focal deficits Psych: Calm, cooperative, appropriate affect  Condition at discharge: good  The results of significant diagnostics from this hospitalization (including imaging, microbiology, ancillary and laboratory) are listed below for reference.   Imaging Studies: DG HIP UNILAT WITH PELVIS 2-3 VIEWS LEFT Result Date: 09/05/2024 CLINICAL DATA:  [id number] Pain [id number] EXAM: DG HIP (WITH OR WITHOUT PELVIS) 2-3V LEFT COMPARISON:  07/13/2023. FINDINGS: Pelvis is intact with normal and symmetric sacroiliac joints. No acute fracture or dislocation. No aggressive osseous lesion. Visualized sacral arcuate lines are unremarkable. Unremarkable symphysis pubis. There are mild degenerative changes of bilateral hip joints without significant joint space narrowing. Osteophytosis of the superior acetabulum. No radiopaque foreign bodies. IMPRESSION: No acute osseous abnormality of the pelvis or left hip joint. Electronically Signed   By: Ree Molt M.D.   On: 09/05/2024 16:01   DG Abd Portable 1V-Small Bowel Obstruction  Protocol-initial, 8 hr delay Result Date: 09/03/2024 EXAM: 1 VIEW XRAY OF THE ABDOMEN 09/03/2024 03:00:00 AM COMPARISON: 09/02/2024 CLINICAL HISTORY: FINDINGS: LINES, TUBES AND DEVICES: Enteric tube in gastric body. BOWEL: Administered oral contrast opacifies numerous loops of nondistended small bowel, and fills the nondilated colon extending into the rectosigmoid colon. The findings are in keeping with a resolved small bowel obstruction. No free intraperitoneal gas. SOFT TISSUES: Surgical clips in right upper quadrant, consistent with prior cholecystectomy. No abnormal calcifications. BONES: Postoperative changes in lower lumbar spine. No acute fracture. IMPRESSION: 1. Findings consistent with resolved small bowel obstruction. 2. Enteric tube terminates in the gastric body. 3. Prior cholecystectomy and postoperative changes in the lower lumbar spine. Electronically signed by: Dorethia Molt MD 09/03/2024 03:52 AM EST RP Workstation: [id number]   US  Abdomen Limited RUQ (LIVER/GB) Result Date: 09/02/2024 CLINICAL DATA:  History of prior cholecystectomy sent to evaluate for choledocholithiasis. EXAM: ULTRASOUND ABDOMEN LIMITED RIGHT UPPER QUADRANT COMPARISON:  None Available. FINDINGS: Gallbladder: The gallbladder is surgically absent. Common bile duct: Diameter: 7.98 mm (9.87 mm distally) Liver: No focal lesion identified. There is evidence of intrahepatic biliary dilatation. Diffusely decreased echogenicity of the liver parenchyma is noted. Portal vein is patent on color Doppler imaging with normal direction of blood flow towards the liver. Other: The study is technically limited secondary to the patient's body habitus and inability to follow commands during the examination, as per the ultrasound technologist. IMPRESSION: 1. Findings consistent with prior cholecystectomy. 2. Dilated distal common bile duct without visualization of obstructing gallstones. Further evaluation with MRCP is with if choledocholithiasis  remains of clinical concern. Electronically Signed   By: Suzen Dials M.D.   On: 09/02/2024 10:59   DG Abd 1 View Result Date: 09/02/2024 CLINICAL DATA:  Nasogastric tube placement EXAM: ABDOMEN - 1 VIEW COMPARISON:  September 30, 2023 FINDINGS: Distal tip of nasogastric tube is seen in proximal stomach. IMPRESSION: Distal tip of nasogastric tube is seen in proximal stomach. Electronically Signed   By: Lynwood Landy Raddle M.D.   On: 09/02/2024 10:57   CT Angio Abd/Pel W and/or Wo Contrast Result Date: 09/02/2024 CLINICAL DATA:  Mesenteric ischemia. EXAM: CTA ABDOMEN AND PELVIS WITHOUT AND WITH CONTRAST TECHNIQUE: Multidetector CT imaging of the abdomen and pelvis was performed using the standard protocol during bolus administration of intravenous contrast. Multiplanar reconstructed images and MIPs were obtained and reviewed to evaluate the vascular anatomy. RADIATION DOSE REDUCTION: This exam was performed according to the departmental dose-optimization  program which includes automated exposure control, adjustment of the mA and/or kV according to patient size and/or use of iterative reconstruction technique. CONTRAST:  75mL OMNIPAQUE  IOHEXOL  350 MG/ML SOLN COMPARISON:  Outside abdomen and pelvis CT 09/30/2023. FINDINGS: VASCULAR Aorta: Normal caliber aorta without aneurysm, dissection, vasculitis or significant stenosis. Advanced atherosclerotic disease. Abdominal aorta is obscured in some areas by motion artifact from spinal fixation hardware. Celiac: Patent without evidence of aneurysm, dissection, vasculitis or significant stenosis. Atherosclerosis. SMA: Patent without evidence of aneurysm, dissection, vasculitis or significant stenosis. Atherosclerosis. Renals: Both renal arteries patent with moderate to advanced atherosclerotic calcified plaque in the ostial region bilaterally. IMA: Obscured by beam hardening artifact proximally. Inflow: Patent without evidence of aneurysm, dissection, vasculitis or  significant stenosis. Proximal Outflow: Atherosclerotic disease. Veins: No obvious venous abnormality within the limitations of this arterial phase study. Review of the MIP images confirms the above findings. NON-VASCULAR Lower chest: 4 mm left lower lobe pulmonary nodule on 23/8. Hepatobiliary: No suspicious focal abnormality within the liver parenchyma. A tiny hypodensity in the liver parenchyma is too small to characterize but is statistically most likely benign. No followup imaging is recommended. Gallbladder is surgically absent. Mild intrahepatic biliary duct prominence. Common bile duct measures up to 8 mm diameter. 9 mm calcification is identified in the region of the ampulla and appears to be in line with the common bile duct (see axial images 19 and 20 of series 2) raising concern for distal common bile duct stone. Pancreas: Pancreas is diffusely atrophic with diffuse dilatation of the main pancreatic duct. See paragraph above. Spleen: No splenomegaly. No suspicious focal mass lesion. Adrenals/Urinary Tract: No adrenal nodule or mass. Right kidney unremarkable. 4.9 cm exophytic cyst upper pole left kidney has a single thin internal septation compatible with Bosniak II category. Immediately adjacent Bosniak I 2.4 cm left renal cyst. No evidence for hydroureter. Bladder is distended. Stomach/Bowel: Stomach is distended with food fluid and gas. Duodenum is normally positioned as is the ligament of Treitz. Dilated small bowel loops in the abdomen and upper pelvis measure up to 3.4 cm diameter. The most dilated loops show ill-defined walls, subtle perienteric edema in fecalization of enteric contents. No abrupt or discrete transition zone can be identified although the small bowel appears to be tethered anteriorly up against the peritoneum just deep to the rectus sheath where there is ill-defined amorphous soft tissue in central gas evident (see coronal images 102-105 of series 11). In retrospect, previous  outside CT from 09/30/2023 showed soft tissue thickening and probable granulation inter just deep to the midline rectus sheath with apparent intraluminal containing gas collection. Small bowel in the right lower quadrant appears relatively decompressed. Neither the terminal ileum nor the appendix are well visualized. Colonic diverticulosis evident without definite features of diverticulitis. Lymphatic: No abdominal lymphadenopathy. No pelvic sidewall lymphadenopathy. Reproductive: There is no adnexal mass. Other: No substantial intraperitoneal free fluid. Musculoskeletal: Bones are diffusely demineralized. Posterior spinal fusion hardware noted L3-4. IMPRESSION: 1. No evidence for abdominal aortic aneurysm or dissection. No evidence for mesenteric arterial occlusion. 2. Dilated small bowel loops in the abdomen and upper pelvis measure up to 3.4 cm diameter. The most dilated loops show ill-defined walls, subtle perienteric edema and fecalization of enteric contents. No abrupt or discrete transition zone can be identified although the small bowel appears to be tethered anteriorly up against the peritoneum just deep to the rectus sheath where there is ill-defined amorphous soft tissue with central gas evident that appears to be extraluminal  but contain/loculated within the soft tissue. Previous outside CT from 09/30/2023 showed soft tissue thickening and probable granulation just deep to the midline rectus sheath with apparent extraluminal gas collection. Imaging features are compatible with small bowel obstruction, likely secondary to adhesions and/or desmoplastic/neoplastic process in the anterior upper pelvis incorporating small bowel loops. Chronic contained perforation or neoplasm could have this appearance. 3. 9 mm calcification identified in the region of the ampulla and appears to be in line with the dilated common bile duct raising concern for distal common bile duct stone. Pancreatic parenchyma is diffusely  atrophic around the dilated duct. Mild intrahepatic biliary duct prominence with common bile duct measuring up to 8 mm diameter. 4. 4 mm left lower lobe pulmonary nodule. No follow-up needed if patient is low-risk.This recommendation follows the consensus statement: Guidelines for Management of Incidental Pulmonary Nodules Detected on CT Images: From the Fleischner Society 2017; Radiology 2017; 284:228-243. 5. Colonic diverticulosis without diverticulitis. Electronically Signed   By: Camellia Candle M.D.   On: 09/02/2024 05:53   DG Lumbar Spine Complete Result Date: 08/15/2024 EXAM: 4 VIEW(S) XRAY OF THE LUMBAR SPINE 08/15/2024 03:45:00 AM COMPARISON: 09/16/2023 CLINICAL HISTORY: fall FINDINGS: LUMBAR SPINE: BONES: Vertebral body heights are maintained. Alignment is normal. Posterior fusion changes at L3-L4. DISCS AND DEGENERATIVE CHANGES: Diffuse degenerative disc and facet disease. SOFT TISSUES: Aortic atherosclerosis. IMPRESSION: 1. No evidence of acute traumatic injury. Electronically signed by: Franky Crease MD 08/15/2024 03:55 AM EST RP Workstation: [id number]   DG Femur Min 2 Views Right Result Date: 08/15/2024 EXAM: 2 VIEW(S) XRAY OF THE RIGHT FEMUR 08/15/2024 03:45:00 AM COMPARISON: 09/13/2023 CLINICAL HISTORY: fall FINDINGS: BONES AND JOINTS: Plate and screw fixation is present in the mid to distal femur. No acute fracture, subluxation, or dislocation. No malalignment. Tricompartmental degenerative changes are noted in the right knee. SOFT TISSUES: The soft tissues are unremarkable. IMPRESSION: 1. No acute bony abnormality. Electronically signed by: Franky Crease MD 08/15/2024 03:54 AM EST RP Workstation: [id number]   DG Knee Complete 4 Views Right Result Date: 08/15/2024 EXAM: 4 OR MORE VIEW(S) XRAY OF THE RIGHT KNEE 08/15/2024 03:45:00 AM COMPARISON: 09/13/2023 CLINICAL HISTORY: fall FINDINGS: BONES AND JOINTS: No acute fracture. No malalignment. No significant joint effusion. Plate and screw fixation  noted in the distal femur. Tricompartmental degenerative changes in the right knee. SOFT TISSUES: The soft tissues are unremarkable. IMPRESSION: 1. No acute fracture or dislocation. Electronically signed by: Franky Crease MD 08/15/2024 03:53 AM EST RP Workstation: [id number]   DG Pelvis 1-2 Views Result Date: 08/15/2024 EXAM: 1 or 2 VIEW(S) XRAY OF THE PELVIS 08/15/2024 03:45:00 AM COMPARISON: Comparison 09/30/2023. CLINICAL HISTORY: fall FINDINGS: BONES AND JOINTS: Diffuse osteopenia. No acute fracture. No malalignment. SOFT TISSUES: The soft tissues are unremarkable. IMPRESSION: 1. No acute bony abnormality. Electronically signed by: Franky Crease MD 08/15/2024 03:52 AM EST RP Workstation: [id number]    Microbiology: Results for orders placed or performed during the hospital encounter of 09/12/23  Surgical PCR screen     Status: None   Collection Time: 09/12/23 10:29 PM   Specimen: Nasal Mucosa; Nasal Swab  Result Value Ref Range Status   MRSA, PCR NEGATIVE NEGATIVE Final   Staphylococcus aureus NEGATIVE NEGATIVE Final    Comment: (NOTE) The Xpert SA Assay (FDA approved for NASAL specimens in patients 32 years of age and older), is one component of a comprehensive surveillance program. It is not intended to diagnose infection nor to guide or monitor treatment. Performed at [facility], [address] [place], [place] [address]     Labs: CBC: Recent Labs  Lab 09/02/24 0222 09/03/24 1135 09/04/24 0538  WBC 15.6* 8.6 8.4  NEUTROABS 12.0*  --   --   HGB 14.6 13.2 11.8*  HCT 44.5 41.3 36.3  MCV 90.1 91.8 90.8  PLT 289 202 179   Basic Metabolic Panel: Recent Labs  Lab 09/02/24 0222 09/03/24 1135 09/04/24 0538 09/05/24 0400  NA 138 144 141 138  K 3.9 3.4* 3.4* 3.5  CL 101 107 105 104  CO2 19* 26 25 24   GLUCOSE 180* 106* 124* 124*  BUN 16 15 11  7*  CREATININE 0.71 0.61 0.54 0.49  CALCIUM  10.4* 9.1 8.7* 8.7*  MG  --  1.9  --   --    Liver Function Tests: Recent  Labs  Lab 09/02/24 0222 09/03/24 1135  AST 22 18  ALT 10 6  ALKPHOS 155* 116  BILITOT 0.6 0.7  PROT 8.0 6.2*  ALBUMIN  4.4 3.4*   CBG: Recent Labs  Lab 09/06/24 2337 09/07/24 0420 09/07/24 1819 09/07/24 2358 09/08/24 0629  GLUCAP 131* 146* 227* 178* 132*    Discharge time spent: Time Coordinating Discharge: I spent a total of 35 minutes engaged in face-to-face discussion with the patient and/or caregivers regarding the patients care, assessment, plan, and discharge disposition. Over 50% of this time was dedicated to counseling the patient on the risks and benefits of treatment options and the discharge plan, as well as coordinating post-discharge care.   Signed: Romelle Muldoon Al-Sultani, MD [facility] 09/08/2024         "

## 2024-09-08 NOTE — Progress Notes (Signed)
 Mobility Specialist Progress Note:    09/08/24 0914  Mobility  Activity Pivoted/transferred from bed to chair  Level of Assistance Minimal assist, patient does 75% or more  Assistive Device Front wheel walker  Distance Ambulated (ft) 5 ft  Activity Response Tolerated well  Mobility Referral Yes  Mobility visit 1 Mobility  Mobility Specialist Start Time (ACUTE ONLY) [id number]  Mobility Specialist Stop Time (ACUTE ONLY) 0911  Mobility Specialist Time Calculation (min) (ACUTE ONLY) 18 min   Received pt in bed and agreeable to mobility. Pt required MinA STS otherwise tolerated well. Left pt in chair with alarm on. No c/o. Personal belongings and call light within reach. All needs met.  Lavanda Pollack Mobility Specialist  Please contact via [employer] or  Rehab Office [phone]

## 2024-09-08 NOTE — TOC Progression Note (Signed)
 Transition of Care [facility]) - Progression Note    Patient Details  Name: Rhonda Watkins MRN: [id number] Date of Birth: 01-06-1932  Transition of Care [facility] CM/SW Contact  Mirissa Lopresti LITTIE Mo[place], [place] Phone Number: 09/08/2024, 9:19 AM  Clinical Narrative:    CSW received insurance auth approval for [facility]. Auth ID [id number] effectove 1/28-1/30. CSW confirmed bed availability with facility, pt can admit following hospital DC. CSW will continue to follow.     Barriers to Discharge: Continued Medical Work up               Expected Discharge Plan and Services         Expected Discharge Date: 09/06/24                                     Social Drivers of Health (SDOH) Interventions SDOH Screenings   Food Insecurity: Patient Unable To Answer (09/02/2024)  Housing: Patient Unable To Answer (09/02/2024)  Transportation Needs: Patient Unable To Answer (09/02/2024)  Utilities: Patient Unable To Answer (09/02/2024)  Social Connections: Patient Unable To Answer (09/02/2024)  Tobacco Use: Medium Risk (09/02/2024)    Readmission Risk Interventions     No data to display

## 2024-09-27 ENCOUNTER — Inpatient Hospital Stay (HOSPITAL_BASED_OUTPATIENT_CLINIC_OR_DEPARTMENT_OTHER): Admitting: Pulmonary Disease

## 2024-11-25 ENCOUNTER — Ambulatory Visit: Admitting: Internal Medicine

## 2024-12-15 ENCOUNTER — Ambulatory Visit: Admitting: Diagnostic Neuroimaging
